# Patient Record
Sex: Female | Born: 1950
Health system: Southern US, Community
[De-identification: ages and names within clinical notes are randomized; demographics above are authoritative.]

## PROBLEM LIST (undated history)

## (undated) DIAGNOSIS — G709 Myoneural disorder, unspecified: Secondary | ICD-10-CM

## (undated) DIAGNOSIS — N951 Menopausal and female climacteric states: Secondary | ICD-10-CM

## (undated) DIAGNOSIS — Z923 Personal history of irradiation: Secondary | ICD-10-CM

## (undated) DIAGNOSIS — L309 Dermatitis, unspecified: Secondary | ICD-10-CM

## (undated) DIAGNOSIS — F419 Anxiety disorder, unspecified: Secondary | ICD-10-CM

## (undated) DIAGNOSIS — M79671 Pain in right foot: Secondary | ICD-10-CM

## (undated) DIAGNOSIS — R351 Nocturia: Secondary | ICD-10-CM

## (undated) DIAGNOSIS — C50412 Malignant neoplasm of upper-outer quadrant of left female breast: Secondary | ICD-10-CM

## (undated) DIAGNOSIS — Z79890 Hormone replacement therapy (postmenopausal): Secondary | ICD-10-CM

## (undated) DIAGNOSIS — F32A Depression, unspecified: Secondary | ICD-10-CM

## (undated) DIAGNOSIS — Z170 Estrogen receptor positive status [ER+]: Secondary | ICD-10-CM

## (undated) DIAGNOSIS — C50919 Malignant neoplasm of unspecified site of unspecified female breast: Secondary | ICD-10-CM

## (undated) DIAGNOSIS — IMO0002 Reserved for concepts with insufficient information to code with codable children: Secondary | ICD-10-CM

## (undated) DIAGNOSIS — Z0000 Encounter for general adult medical examination without abnormal findings: Secondary | ICD-10-CM

## (undated) DIAGNOSIS — F329 Major depressive disorder, single episode, unspecified: Secondary | ICD-10-CM

## (undated) HISTORY — DX: Menopausal and female climacteric states: N95.1

## (undated) HISTORY — DX: Myoneural disorder, unspecified: G70.9

## (undated) HISTORY — DX: Encounter for general adult medical examination without abnormal findings: Z00.00

## (undated) HISTORY — DX: Pain in right foot: M79.671

## (undated) HISTORY — DX: Depression, unspecified: F32.A

## (undated) HISTORY — PX: TONSILLECTOMY: SUR1361

## (undated) HISTORY — DX: Dermatitis, unspecified: L30.9

## (undated) HISTORY — DX: Hormone replacement therapy: Z79.890

## (undated) HISTORY — DX: Major depressive disorder, single episode, unspecified: F32.9

## (undated) HISTORY — DX: Nocturia: R35.1

## (undated) HISTORY — DX: Reserved for concepts with insufficient information to code with codable children: IMO0002

## (undated) HISTORY — DX: Estrogen receptor positive status (ER+): Z17.0

## (undated) HISTORY — DX: Malignant neoplasm of upper-outer quadrant of left female breast: C50.412

## (undated) HISTORY — PX: BREAST LUMPECTOMY: SHX2

## (undated) HISTORY — PX: EYE SURGERY: SHX253

---

## 1999-01-21 ENCOUNTER — Other Ambulatory Visit: Admission: RE | Admit: 1999-01-21 | Discharge: 1999-01-21 | Payer: Self-pay | Admitting: Family Medicine

## 2000-09-19 ENCOUNTER — Other Ambulatory Visit: Admission: RE | Admit: 2000-09-19 | Discharge: 2000-09-19 | Payer: Self-pay | Admitting: Family Medicine

## 2001-10-11 ENCOUNTER — Encounter (INDEPENDENT_AMBULATORY_CARE_PROVIDER_SITE_OTHER): Payer: Self-pay | Admitting: Internal Medicine

## 2001-10-11 LAB — CONVERTED CEMR LAB: Pap Smear: NORMAL

## 2001-10-19 ENCOUNTER — Other Ambulatory Visit: Admission: RE | Admit: 2001-10-19 | Discharge: 2001-10-19 | Payer: Self-pay | Admitting: Family Medicine

## 2003-11-12 ENCOUNTER — Encounter (INDEPENDENT_AMBULATORY_CARE_PROVIDER_SITE_OTHER): Payer: Self-pay | Admitting: Internal Medicine

## 2003-11-12 LAB — CONVERTED CEMR LAB: Pap Smear: NORMAL

## 2003-11-22 ENCOUNTER — Other Ambulatory Visit: Admission: RE | Admit: 2003-11-22 | Discharge: 2003-11-22 | Payer: Self-pay | Admitting: Internal Medicine

## 2003-12-27 ENCOUNTER — Encounter: Admission: RE | Admit: 2003-12-27 | Discharge: 2003-12-27 | Payer: Self-pay | Admitting: Family Medicine

## 2004-01-08 ENCOUNTER — Emergency Department (HOSPITAL_COMMUNITY): Admission: EM | Admit: 2004-01-08 | Discharge: 2004-01-08 | Payer: Self-pay | Admitting: Emergency Medicine

## 2004-05-09 ENCOUNTER — Emergency Department (HOSPITAL_COMMUNITY): Admission: EM | Admit: 2004-05-09 | Discharge: 2004-05-09 | Payer: Self-pay | Admitting: Emergency Medicine

## 2004-05-11 ENCOUNTER — Emergency Department (HOSPITAL_COMMUNITY): Admission: EM | Admit: 2004-05-11 | Discharge: 2004-05-11 | Payer: Self-pay | Admitting: Emergency Medicine

## 2004-12-04 ENCOUNTER — Ambulatory Visit: Payer: Self-pay | Admitting: Family Medicine

## 2004-12-04 ENCOUNTER — Other Ambulatory Visit: Admission: RE | Admit: 2004-12-04 | Discharge: 2004-12-16 | Payer: Self-pay | Admitting: Internal Medicine

## 2004-12-09 ENCOUNTER — Encounter (INDEPENDENT_AMBULATORY_CARE_PROVIDER_SITE_OTHER): Payer: Self-pay | Admitting: Internal Medicine

## 2005-12-27 ENCOUNTER — Ambulatory Visit: Payer: Self-pay | Admitting: Family Medicine

## 2006-01-09 ENCOUNTER — Encounter (INDEPENDENT_AMBULATORY_CARE_PROVIDER_SITE_OTHER): Payer: Self-pay | Admitting: Internal Medicine

## 2006-01-09 LAB — CONVERTED CEMR LAB: Pap Smear: NORMAL

## 2006-01-17 ENCOUNTER — Other Ambulatory Visit: Admission: RE | Admit: 2006-01-17 | Discharge: 2006-01-17 | Payer: Self-pay | Admitting: Family Medicine

## 2006-01-17 ENCOUNTER — Ambulatory Visit: Payer: Self-pay | Admitting: Family Medicine

## 2007-01-31 ENCOUNTER — Encounter (INDEPENDENT_AMBULATORY_CARE_PROVIDER_SITE_OTHER): Payer: Self-pay | Admitting: Internal Medicine

## 2007-01-31 ENCOUNTER — Other Ambulatory Visit: Admission: RE | Admit: 2007-01-31 | Discharge: 2007-01-31 | Payer: Self-pay | Admitting: Family Medicine

## 2007-01-31 ENCOUNTER — Ambulatory Visit: Payer: Self-pay | Admitting: Family Medicine

## 2007-01-31 LAB — CONVERTED CEMR LAB: Pap Smear: NORMAL

## 2007-02-01 LAB — CONVERTED CEMR LAB
ALT: 19 units/L (ref 0–40)
AST: 19 units/L (ref 0–37)
BUN: 20 mg/dL (ref 6–23)
Basophils Absolute: 0 10*3/uL (ref 0.0–0.1)
Basophils Relative: 0.4 % (ref 0.0–1.0)
Bilirubin, Direct: 0.1 mg/dL (ref 0.0–0.3)
CO2: 27 meq/L (ref 19–32)
Calcium: 9.5 mg/dL (ref 8.4–10.5)
Chloride: 107 meq/L (ref 96–112)
Cholesterol: 160 mg/dL (ref 0–200)
Eosinophils Absolute: 0.1 10*3/uL (ref 0.0–0.6)
Eosinophils Relative: 1.7 % (ref 0.0–5.0)
GFR calc Af Amer: 96 mL/min
Glucose, Bld: 88 mg/dL (ref 70–99)
HDL: 59.8 mg/dL (ref >39.0)
Hemoglobin: 13.8 g/dL (ref 12.0–15.0)
LDL Cholesterol: 88 mg/dL (ref 0–99)
MCV: 96.1 fL (ref 78.0–100.0)
Neutro Abs: 3.5 10*3/uL (ref 1.4–7.7)
RDW: 11.8 % (ref 11.5–14.6)
Total CHOL/HDL Ratio: 2.7
Triglycerides: 59 mg/dL (ref 0–149)
WBC: 5.8 10*3/uL (ref 4.5–10.5)

## 2007-03-01 ENCOUNTER — Encounter: Payer: Self-pay | Admitting: Internal Medicine

## 2007-03-02 ENCOUNTER — Ambulatory Visit: Payer: Self-pay | Admitting: Family Medicine

## 2007-03-02 DIAGNOSIS — F329 Major depressive disorder, single episode, unspecified: Secondary | ICD-10-CM

## 2007-03-02 DIAGNOSIS — F325 Major depressive disorder, single episode, in full remission: Secondary | ICD-10-CM | POA: Insufficient documentation

## 2007-03-02 DIAGNOSIS — F339 Major depressive disorder, recurrent, unspecified: Secondary | ICD-10-CM | POA: Insufficient documentation

## 2007-08-14 ENCOUNTER — Telehealth (INDEPENDENT_AMBULATORY_CARE_PROVIDER_SITE_OTHER): Payer: Self-pay | Admitting: Internal Medicine

## 2008-04-04 ENCOUNTER — Telehealth (INDEPENDENT_AMBULATORY_CARE_PROVIDER_SITE_OTHER): Payer: Self-pay | Admitting: Internal Medicine

## 2008-05-06 ENCOUNTER — Encounter (INDEPENDENT_AMBULATORY_CARE_PROVIDER_SITE_OTHER): Payer: Self-pay | Admitting: Internal Medicine

## 2008-05-06 ENCOUNTER — Ambulatory Visit: Payer: Self-pay | Admitting: Family Medicine

## 2008-05-06 ENCOUNTER — Other Ambulatory Visit: Admission: RE | Admit: 2008-05-06 | Discharge: 2008-05-06 | Payer: Self-pay | Admitting: Family Medicine

## 2008-05-06 DIAGNOSIS — R351 Nocturia: Secondary | ICD-10-CM

## 2008-05-06 LAB — CONVERTED CEMR LAB: Pap Smear: NORMAL

## 2008-05-07 LAB — CONVERTED CEMR LAB
Albumin: 4 g/dL (ref 3.5–5.2)
Alkaline Phosphatase: 30 units/L — ABNORMAL LOW (ref 39–117)
Basophils Absolute: 0 10*3/uL (ref 0.0–0.1)
Bilirubin, Direct: 0.1 mg/dL (ref 0.0–0.3)
Eosinophils Absolute: 0.1 10*3/uL (ref 0.0–0.7)
Eosinophils Relative: 2.2 % (ref 0.0–5.0)
GFR calc non Af Amer: 79 mL/min
Glucose, Bld: 88 mg/dL (ref 70–99)
HDL: 58.2 mg/dL (ref >39.0)
Hemoglobin: 13.6 g/dL (ref 12.0–15.0)
Lymphocytes Relative: 31.1 % (ref 12.0–46.0)
MCHC: 33.8 g/dL (ref 30.0–36.0)
Monocytes Absolute: 0.3 10*3/uL (ref 0.1–1.0)
Monocytes Relative: 5.9 % (ref 3.0–12.0)
Neutrophils Relative %: 60.3 % (ref 43.0–77.0)
Platelets: 221 10*3/uL (ref 150–400)
Sodium: 140 meq/L (ref 135–145)
TSH: 1.51 microintl units/mL (ref 0.35–5.50)
Total Bilirubin: 0.9 mg/dL (ref 0.3–1.2)
Total Protein: 6.6 g/dL (ref 6.0–8.3)
Triglycerides: 60 mg/dL (ref 0–149)
WBC: 4.5 10*3/uL (ref 4.5–10.5)

## 2008-05-10 ENCOUNTER — Encounter (INDEPENDENT_AMBULATORY_CARE_PROVIDER_SITE_OTHER): Payer: Self-pay | Admitting: *Deleted

## 2008-05-10 ENCOUNTER — Encounter (INDEPENDENT_AMBULATORY_CARE_PROVIDER_SITE_OTHER): Payer: Self-pay | Admitting: Internal Medicine

## 2008-05-15 ENCOUNTER — Encounter: Admission: RE | Admit: 2008-05-15 | Discharge: 2008-05-15 | Payer: Self-pay | Admitting: Family Medicine

## 2008-05-20 ENCOUNTER — Encounter (INDEPENDENT_AMBULATORY_CARE_PROVIDER_SITE_OTHER): Payer: Self-pay | Admitting: *Deleted

## 2008-05-20 ENCOUNTER — Encounter (INDEPENDENT_AMBULATORY_CARE_PROVIDER_SITE_OTHER): Payer: Self-pay | Admitting: Internal Medicine

## 2008-05-22 ENCOUNTER — Telehealth (INDEPENDENT_AMBULATORY_CARE_PROVIDER_SITE_OTHER): Payer: Self-pay | Admitting: Internal Medicine

## 2009-05-19 ENCOUNTER — Telehealth: Payer: Self-pay | Admitting: Family Medicine

## 2009-06-17 ENCOUNTER — Encounter (INDEPENDENT_AMBULATORY_CARE_PROVIDER_SITE_OTHER): Payer: Self-pay | Admitting: Internal Medicine

## 2009-06-17 ENCOUNTER — Ambulatory Visit: Payer: Self-pay | Admitting: Family Medicine

## 2009-06-17 ENCOUNTER — Telehealth (INDEPENDENT_AMBULATORY_CARE_PROVIDER_SITE_OTHER): Payer: Self-pay | Admitting: Internal Medicine

## 2009-06-17 ENCOUNTER — Other Ambulatory Visit: Admission: RE | Admit: 2009-06-17 | Discharge: 2009-06-17 | Payer: Self-pay | Admitting: Family Medicine

## 2009-06-17 DIAGNOSIS — IMO0002 Reserved for concepts with insufficient information to code with codable children: Secondary | ICD-10-CM

## 2009-06-18 LAB — CONVERTED CEMR LAB
ALT: 14 units/L (ref 0–35)
AST: 18 units/L (ref 0–37)
Albumin: 4.4 g/dL (ref 3.5–5.2)
BUN: 14 mg/dL (ref 6–23)
CO2: 27 meq/L (ref 19–32)
Cholesterol: 173 mg/dL (ref 0–200)
HDL: 78.2 mg/dL (ref >39.00)
LDL Cholesterol: 82 mg/dL (ref 0–99)
TSH: 1.06 microintl units/mL (ref 0.35–5.50)
Total Protein: 7.2 g/dL (ref 6.0–8.3)
VLDL: 12.4 mg/dL (ref 0.0–40.0)

## 2009-06-19 ENCOUNTER — Encounter: Payer: Self-pay | Admitting: Family Medicine

## 2009-06-19 ENCOUNTER — Encounter: Admission: RE | Admit: 2009-06-19 | Discharge: 2009-06-19 | Payer: Self-pay | Admitting: Family Medicine

## 2009-06-23 ENCOUNTER — Encounter: Payer: Self-pay | Admitting: Family Medicine

## 2009-06-23 ENCOUNTER — Encounter (INDEPENDENT_AMBULATORY_CARE_PROVIDER_SITE_OTHER): Payer: Self-pay | Admitting: Internal Medicine

## 2009-06-25 ENCOUNTER — Encounter: Admission: RE | Admit: 2009-06-25 | Discharge: 2009-06-25 | Payer: Self-pay | Admitting: Family Medicine

## 2009-06-26 ENCOUNTER — Encounter (INDEPENDENT_AMBULATORY_CARE_PROVIDER_SITE_OTHER): Payer: Self-pay | Admitting: Internal Medicine

## 2009-06-26 ENCOUNTER — Encounter: Payer: Self-pay | Admitting: Family Medicine

## 2009-07-02 ENCOUNTER — Telehealth (INDEPENDENT_AMBULATORY_CARE_PROVIDER_SITE_OTHER): Payer: Self-pay | Admitting: Internal Medicine

## 2009-08-25 DIAGNOSIS — M79609 Pain in unspecified limb: Secondary | ICD-10-CM

## 2009-08-26 ENCOUNTER — Encounter (INDEPENDENT_AMBULATORY_CARE_PROVIDER_SITE_OTHER): Payer: Self-pay | Admitting: Internal Medicine

## 2009-08-26 ENCOUNTER — Ambulatory Visit: Payer: Self-pay | Admitting: Family Medicine

## 2009-12-26 ENCOUNTER — Ambulatory Visit: Payer: Self-pay | Admitting: Family Medicine

## 2009-12-26 DIAGNOSIS — N951 Menopausal and female climacteric states: Secondary | ICD-10-CM | POA: Insufficient documentation

## 2010-02-23 ENCOUNTER — Telehealth: Payer: Self-pay | Admitting: Family Medicine

## 2010-02-25 ENCOUNTER — Telehealth: Payer: Self-pay | Admitting: Family Medicine

## 2010-07-07 ENCOUNTER — Telehealth: Payer: Self-pay | Admitting: Family Medicine

## 2010-07-22 ENCOUNTER — Ambulatory Visit: Payer: Self-pay | Admitting: Family Medicine

## 2010-07-22 DIAGNOSIS — L259 Unspecified contact dermatitis, unspecified cause: Secondary | ICD-10-CM | POA: Insufficient documentation

## 2010-09-09 ENCOUNTER — Telehealth: Payer: Self-pay | Admitting: Family Medicine

## 2010-11-10 NOTE — Assessment & Plan Note (Signed)
Summary: rash/alc   Vital Signs:  Patient profile:   60 year old female Height:      62.5 inches Weight:      119 pounds BMI:     21.50 Temp:     98.4 degrees F oral Pulse rate:   72 / minute Pulse rhythm:   regular BP sitting:   110 / 82  (left arm) Cuff size:   regular  Vitals Entered By: Linde Gillis CMA Duncan Dull) (July 22, 2010 8:09 AM) CC: rash   History of Present Illness: 60 yo with rash x 3 weeks. Started on chest, very itchy raised bumped.  Occurred a week or so after a sunburn. Rash has not spread elsewhere.  Has improved a little bit since she started using OTC hydrocortisone cream. No changes in detergents or lotions other than OTC lidocaine/aloe she was using for her burn.  Did not take any antihistamines. Never had anything like this before.  Current Medications (verified): 1)  Lexapro 20 Mg  Tabs (Escitalopram Oxalate) .... Take 1 and 1/2 Tabs By Mouth Daily 2)  Premarin 0.3 Mg Tabs (Estrogens Conjugated) .... One By Mouth Daily 3)  Fluocinolone Acetonide 0.01 % Crea (Fluocinolone Acetonide) .... Please Apply To Area Two Times A Day As Needed.  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 Resp:  Denies shortness of breath and wheezing. Derm:  Complains of itching and  rash.  Physical Exam  General:  alert, well-developed, well-nourished, and well-hydrated.   Skin:  scattered raised lesions consistent with urticaria on her anterior chest wall.  Skin otherwise normal Psych:  normally interactive and slightly anxious.     Impression & Recommendations:  Problem # 1:  DERMATITIS, ALLERGIC (ICD-692.9) Assessment New ? due to OTC burn relief preparation. Advised to try Benadryl OTC as well as Lidex as needed. Pt to follow up in 1-2 weeks. Her updated medication list for this problem includes:    Fluocinolone Acetonide 0.01 % Crea (Fluocinolone acetonide) .Marland Kitchen... Please apply to area two times a day as needed.  Complete Medication List: 1)  Lexapro 20 Mg Tabs (Escitalopram oxalate) .... Take 1 and 1/2 tabs by mouth daily 2)  Premarin 0.3 Mg Tabs (Estrogens conjugated) .... One by mouth daily 3)  Fluocinolone Acetonide 0.01 % Crea (Fluocinolone acetonide) .... Please apply to area two times a day as needed.  Patient Instructions: 1)  Great to see you. 2)  Please try Benadryl 25 mg at night to see if this helps, also use the lidex cream twice daily as needed. 3)  Call me a in couple of weeks to let me know how you are doing. Prescriptions: FLUOCINOLONE ACETONIDE 0.01 % CREA (FLUOCINOLONE ACETONIDE) please apply to area two times a day as needed.  #30 g x 0   Entered and Authorized by:   Ruthe Mannan MD   Signed by:   Ruthe Mannan MD on 07/22/2010  Method used:   [facility] [phone][facility]       [address]       [place], [place]  [address]       Ph: [phone] or [phone]       Fax: [phone]   RxID:   [id number]   Current Allergies (reviewed today): No known allergies

## 2010-11-10 NOTE — Progress Notes (Signed)
Summary: Rx Premarin Vaginal Cream  Phone Note Refill Request Call back at [phone] Message from:  [facility] on Feb 23, 2010 10:33 AM  Refills Requested: Medication #1:  PREMARIN 0.625 MG/GM CREA 0.625 g per vaginal daily x 3 weeks   Last Refilled: 12/26/2009 Received e-script request please advise.   Method Requested: Electronic Initial call taken by: Linde Gillis CMA Duncan Dull),  Feb 23, 2010 10:33 AM    Prescriptions: PREMARIN 0.625 MG/GM CREA (ESTROGENS, CONJUGATED) 0.625 g per vaginal daily x 3 weeks, then one week off.  Repeat cycle.  #1 x 1   Entered and Authorized by:   Ruthe Mannan MD   Signed by:   Ruthe Mannan MD on 02/23/2010   Method used:   [facility]ronically to        [facility]  [address] [phone]* (retail)       [address]       [place], [place]  [address]       Ph: [phone] or [phone]       Fax: [phone]   RxID:   [id number]

## 2010-11-10 NOTE — Progress Notes (Signed)
Summary: Rx Lexapro  Phone Note Refill Request Call back at [phone] Message from:  [facility] on July 07, 2010 8:39 AM  Refills Requested: Medication #1:  LEXAPRO 20 MG  TABS Take 1 and 1/2 tabs by mouth daily   Last Refilled: 07/02/2009 Received faxed refill request please advise.     Method Requested: Telephone to Pharmacy Initial call taken by: Linde Gillis CMA Duncan Dull),  July 07, 2010 8:39 AM    Prescriptions: LEXAPRO 20 MG  TABS (ESCITALOPRAM OXALATE) Take 1 and 1/2 tabs by mouth daily  #45 x 12   Entered and Authorized by:   Ruthe Mannan MD   Signed by:   Ruthe Mannan MD on 07/07/2010   Method used:   Electronically to        [facility] [address] [phone]* (retail)       [address]       [place], [place]  [address]       Ph: [phone] or [phone]       Fax: [phone]   RxID:   [id number]

## 2010-11-10 NOTE — Progress Notes (Signed)
Summary: wants to change  to oral premarin  Phone Note Call from Patient Call back at [phone]   Caller: Patient Call For: Dr. Dayton Martes Summary of Call: Pt is currently using premarin cream but she wants to change to the oral medication.  [facility] [address]  The says the cream is inconvenient. Initial call taken by: Lowella Petties CMA,  Feb 25, 2010 4:12 PM    New/Updated Medications: PREMARIN 0.3 MG TABS (ESTROGENS CONJUGATED) one by mouth daily Prescriptions: PREMARIN 0.3 MG TABS (ESTROGENS CONJUGATED) one by mouth daily  #30 x 3   Entered and Authorized by:   Ruthe Mannan MD   Signed by:   Ruthe Mannan MD on 02/26/2010   Method used:   Electronically to        [facility] [address] [phone]* (retail)       [address]       [place], [place]  [address]       Ph: [phone] or [phone]       Fax: [phone]   RxID:   [id number]   Appended Document: wants to change  to oral premarin Left message on voicemail that [facility]

## 2010-11-10 NOTE — Progress Notes (Signed)
Summary: refill request for fluocinolone  Phone Note Refill Request Call back at Work Phone [phone] Call back at [phone] Message from:  Patient  Refills Requested: Medication #1:  FLUOCINOLONE ACETONIDE 0.01 % CREA please apply to area two times a day as needed.. Pt says the rash on her neck is starting to come back.  She would like a refill sent to [address].  Initial call taken by: Lowella Petties CMA, AAMA,  September 09, 2010 3:38 PM  Follow-up for Phone Call        Pls have her return for eval if sxs don't improve in the next 7-10 days. Follow-up by: Shaune Leeks MD,  September 09, 2010 4:12 PM  Additional Follow-up for Phone Call Additional follow up Details #1::        [facility] advising pt. Additional Follow-up by: Lowella Petties CMA, AAMA,  September 09, 2010 4:15 PM    Prescriptions: FLUOCINOLONE ACETONIDE 0.01 % CREA (FLUOCINOLONE ACETONIDE) please apply to area two times a day as needed.  #30 g x 0   Entered and Authorized by:   Shaune Leeks MD   Signed by:   Shaune Leeks MD on 09/09/2010   Method used:   Electronically to        [facility] [address] [phone]* (retail)       [address]       [place], [place]  [id number]       Ph: [phone] or [phone]       Fax: [phone]   RxID:   [id number]

## 2010-11-10 NOTE — Assessment & Plan Note (Signed)
Summary: 30 MIN BILLIE'S PT/CLE   Vital Signs:  Patient profile:   60 year old female Height:      62.5 inches Weight:      115 pounds BMI:     20.77 Temp:     97.9 degrees F oral Pulse rate:   92 / minute Pulse rhythm:   regular BP sitting:   132 / 86  (left arm) Cuff size:   regular  Vitals Entered By: Delilah Shan CMA Duncan Dull) (December 26, 2009 3:16 PM) CC: 30 minute OV  (BDB)   History of Present Illness: 60 yo new to me here to talk about postmenopausal symptoms.  Had been on HRT for years- OCPs and oral premarin. Weaned off both of them in 06/2009 to prevent risk. One week after stopping them, started having hot flashes, insomnia, and vaginal dryness again. No dysparunia. No personal or family h/o breast CA. She is aware of risks but symtpoms are so bad, she willing to try anything.   Current Medications (verified): 1)  Lexapro 20 Mg  Tabs (Escitalopram Oxalate) .... Take 1 and 1/2 Tabs By Mouth Daily 2)  Premarin 0.625 Mg/gm Crea (Estrogens, Conjugated) .... 0.625 G Per Vaginal Daily X 3 Weeks, Then One Week Off.  Repeat Cycle.  Allergies (verified): No Known Drug Allergies  Family History: Father:  Mother:  Siblings:   DM- MI- CVA-  Prostate Cancer- Ovarian Cancer- Uterine Cancer- Colon Cancer- Drug/ ETOH Abuse- Depression-   Review of Systems      See HPI GU:  Complains of nocturia and urinary frequency; denies abnormal vaginal bleeding.  Physical Exam  General:  alert, well-developed, well-nourished, and well-hydrated.  NAD sitting in exam room, walked into office, w/c to exam room Psych:  normally interactive and slightly anxious.     Impression & Recommendations:  Problem # 1:  MENOPAUSE-RELATED VASOMOTOR SYMPTOMS, HOT FLASHES (ICD-627.2) Assessment Deteriorated Time spent with patient 25 minutes, more than 50% of this time was spent counseling patient on treatment options. We could try clonidine for hot flashes. Given vaginal dryness, I did  give her uptodate handouts discussing risks and benefits of estrogen ONLY therapy. She is willing to try topical estrogen for 2 months to see if helps with vaginal dryness, aware of increased risk of breast CA and heart disease although she knows it is lower than with combined therapy.  Follow up with me in 1 month.  Her updated medication list for this problem includes:    Premarin 0.625 Mg/gm Crea (Estrogens, conjugated) .Marland Kitchen... 0.625 g per vaginal daily x 3 weeks, then one week off.  repeat cycle.  Complete Medication List: 1)  Lexapro 20 Mg Tabs (Escitalopram oxalate) .... Take 1 and 1/2 tabs by mouth daily 2)  Premarin 0.625 Mg/gm Crea (Estrogens, conjugated) .... 0.625 g per vaginal daily x 3 weeks, then one week off.  repeat cycle. Prescriptions: PREMARIN 0.625 MG/GM CREA (ESTROGENS, CONJUGATED) 0.625 g per vaginal daily x 3 weeks, then one week off.  Repeat cycle.  #1 x 1   Entered and Authorized by:   Ruthe Mannan MD   Signed by:   Ruthe Mannan MD on 12/26/2009   Method used:   [facility]ronically to        [facility]  [address] [phone]* (retail)       [address]       [place], [place]  [id number]       Ph: [phone]  or [phone]       Fax: [phone]   RxID:   [id number]   Current Allergies (reviewed today): No known allergies

## 2011-05-17 ENCOUNTER — Other Ambulatory Visit: Payer: Self-pay | Admitting: *Deleted

## 2011-05-17 MED ORDER — ESTROGENS CONJUGATED 0.3 MG PO TABS: 0.3000 mg | ORAL_TABLET | Freq: Every day | ORAL | Status: DC

## 2011-05-17 NOTE — Telephone Encounter (Signed)
Last refill 04/12/2011.

## 2011-07-05 ENCOUNTER — Other Ambulatory Visit: Payer: Self-pay | Admitting: *Deleted

## 2011-07-05 MED ORDER — ESTROGENS CONJUGATED 0.3 MG PO TABS: 0.3000 mg | ORAL_TABLET | Freq: Every day | ORAL | Status: DC

## 2011-07-29 ENCOUNTER — Other Ambulatory Visit: Payer: Self-pay | Admitting: *Deleted

## 2011-07-29 MED ORDER — ESCITALOPRAM OXALATE 20 MG PO TABS: 20.0000 mg | ORAL_TABLET | Freq: Every day | ORAL | Status: DC

## 2011-07-29 NOTE — Telephone Encounter (Signed)
THIS MEDICATION IS NOT ON PATIENTS MEDICATION LIST

## 2011-08-12 ENCOUNTER — Telehealth: Payer: Self-pay | Admitting: Family Medicine

## 2011-08-12 MED ORDER — PROGESTERONE MICRONIZED 100 MG PO CAPS: 100.0000 mg | ORAL_CAPSULE | Freq: Every day | ORAL | Status: DC

## 2011-08-12 NOTE — Telephone Encounter (Signed)
Left voicemail for pt to return my call. Has been on unopposed estrogen for several months (still has a uterus). I would like to add Prometrium to her Premarin to decrease risk of endometrial hyperplasia which increases after she has been on it for over a year. I will send rx to her pharmacy.

## 2011-08-13 ENCOUNTER — Telehealth: Payer: Self-pay | Admitting: Family Medicine

## 2011-08-13 MED ORDER — ESTROGENS CONJUGATED 0.3 MG PO TABS: 0.3000 mg | ORAL_TABLET | Freq: Every day | ORAL | Status: DC

## 2011-08-13 MED ORDER — PROGESTERONE MICRONIZED 100 MG PO CAPS: 100.0000 mg | ORAL_CAPSULE | Freq: Every day | ORAL | Status: DC

## 2011-08-13 NOTE — Telephone Encounter (Signed)
Spoke with pt. Doing very well with estrogen. Sleeping better. No PMB. Will add progesterone since she still has a uterus. The patient indicates understanding of these issues and agrees with the plan.

## 2011-10-21 ENCOUNTER — Other Ambulatory Visit: Payer: Self-pay | Admitting: *Deleted

## 2011-10-21 MED ORDER — ESCITALOPRAM OXALATE 20 MG PO TABS: 20.0000 mg | ORAL_TABLET | Freq: Every day | ORAL | Status: DC

## 2011-10-21 NOTE — Telephone Encounter (Signed)
Patient not seen in over 1 year okay to refill? 

## 2011-10-22 ENCOUNTER — Telehealth: Payer: Self-pay | Admitting: Internal Medicine

## 2011-10-22 NOTE — Telephone Encounter (Signed)
Patient stated she was on 30mg  and wanted to know why it was dropped to 20mg .

## 2011-10-22 NOTE — Telephone Encounter (Signed)
Which medication ?

## 2011-10-25 MED ORDER — ESCITALOPRAM OXALATE 20 MG PO TABS: 30.0000 mg | ORAL_TABLET | Freq: Every day | ORAL | Status: DC

## 2011-10-25 NOTE — Telephone Encounter (Signed)
Left message on cell phone voicemail advising patient that [facility]

## 2011-10-25 NOTE — Telephone Encounter (Signed)
Correct rx sent.

## 2011-10-25 NOTE — Telephone Encounter (Signed)
Sorry it was Lexapro.

## 2011-12-19 ENCOUNTER — Other Ambulatory Visit: Payer: Self-pay | Admitting: Family Medicine

## 2011-12-22 ENCOUNTER — Other Ambulatory Visit: Payer: Self-pay | Admitting: Family Medicine

## 2012-01-17 ENCOUNTER — Telehealth: Payer: Self-pay | Admitting: Family Medicine

## 2012-01-17 NOTE — Telephone Encounter (Signed)
Caller: Kathy Bishop/Patient; PCP: Ruthe Mannan ([facility]); CB#: [phone]; Call regarding intermittent light to moderate painless vaginal bleeding x 3 wks. Prescribed Premarin and Prometrium but ook Prometrium x 12 days but dc'd this on 01/13. See in 2 wks per Vaginal Bleeding: On MHT Protocol. Pt is going to refill her Prometrium tonight. She ? why her rx is for Prometrium 100mg  # 12 take 1 at night, if she is to take this daily. Should it be for # 30 pills?  Pls call [facility] for an appt.

## 2012-01-21 ENCOUNTER — Other Ambulatory Visit: Payer: Self-pay | Admitting: Family Medicine

## 2012-02-14 ENCOUNTER — Other Ambulatory Visit: Payer: Self-pay | Admitting: Family Medicine

## 2012-02-15 ENCOUNTER — Encounter: Payer: Self-pay | Admitting: Family Medicine

## 2012-02-15 ENCOUNTER — Ambulatory Visit (INDEPENDENT_AMBULATORY_CARE_PROVIDER_SITE_OTHER): Payer: BC Managed Care – PPO | Admitting: Family Medicine

## 2012-02-15 ENCOUNTER — Other Ambulatory Visit: Payer: Self-pay | Admitting: Family Medicine

## 2012-02-15 VITALS — BP 120/90 | HR 56 | Temp 97.9°F | Wt 115.0 lb

## 2012-02-15 DIAGNOSIS — R5383 Other fatigue: Secondary | ICD-10-CM

## 2012-02-15 DIAGNOSIS — F3289 Other specified depressive episodes: Secondary | ICD-10-CM

## 2012-02-15 DIAGNOSIS — D72819 Decreased white blood cell count, unspecified: Secondary | ICD-10-CM | POA: Insufficient documentation

## 2012-02-15 DIAGNOSIS — R5381 Other malaise: Secondary | ICD-10-CM

## 2012-02-15 DIAGNOSIS — F329 Major depressive disorder, single episode, unspecified: Secondary | ICD-10-CM

## 2012-02-15 LAB — CBC WITH DIFFERENTIAL/PLATELET
Eosinophils Absolute: 0.2 10*3/uL (ref 0.0–0.7)
Eosinophils Relative: 5.2 % — ABNORMAL HIGH (ref 0.0–5.0)
Lymphocytes Relative: 44.8 % (ref 12.0–46.0)
Lymphs Abs: 1.8 10*3/uL (ref 0.7–4.0)
MCHC: 33.9 g/dL (ref 30.0–36.0)
Neutro Abs: 1.7 10*3/uL (ref 1.4–7.7)
Platelets: 183 10*3/uL (ref 150.0–400.0)

## 2012-02-15 LAB — COMPREHENSIVE METABOLIC PANEL
ALT: 21 U/L (ref 0–35)
AST: 25 U/L (ref 0–37)
Albumin: 4.1 g/dL (ref 3.5–5.2)
Alkaline Phosphatase: 47 U/L (ref 39–117)
Chloride: 106 mEq/L (ref 96–112)
GFR: 74.45 mL/min (ref >60.00)
Potassium: 5.1 mEq/L (ref 3.5–5.1)
Total Bilirubin: 0.5 mg/dL (ref 0.3–1.2)

## 2012-02-15 MED ORDER — PROGESTERONE MICRONIZED 100 MG PO CAPS: 100.0000 mg | ORAL_CAPSULE | Freq: Every day | ORAL | Status: DC

## 2012-02-15 MED ORDER — ESCITALOPRAM OXALATE 20 MG PO TABS: 30.0000 mg | ORAL_TABLET | Freq: Every day | ORAL | Status: DC

## 2012-02-15 MED ORDER — ESTROGENS CONJUGATED 0.3 MG PO TABS: ORAL_TABLET | ORAL | Status: DC

## 2012-02-15 NOTE — Patient Instructions (Signed)
Good to see you. You can try to take two tablets of premarin daily and see if that helps with your night flashes. Call me in a few weeks in with an update.

## 2012-02-15 NOTE — Progress Notes (Signed)
Subjective:    Patient ID: Kathy Bishop, female    DOB: Apr 22, 1951, 61 y.o.   MRN: [id number]  HPI  61 yo here for follow up.  Depression- Lexapro is working well.  Denies any symptoms of depression or anxiety.  Fatigue- past year feels more fatigued than usual.  Has never been a great sleeper but usually felt rested during the day.  Denies any CP or SOB. Denies any other symptoms of hypo or hyper thyroidism.  Premarin appears to be working well but having a few more hot flashes than she did after she started taking it.  Taking Prometrium nightly to prevent endometrial hyperplasia.  Patient Active Problem List  Diagnoses  . DEPRESSION  . MENOPAUSE-RELATED VASOMOTOR SYMPTOMS, HOT FLASHES  . DERMATITIS, ALLERGIC  . BACK PAIN, LUMBAR, WITH RADICULOPATHY  . FOOT PAIN, RIGHT  . NOCTURIA  . Fatigue   Past Medical History  Diagnosis Date  . Thoracic or lumbosacral neuritis or radiculitis, unspecified   . Depression   . Dermatitis     allergic  . Foot pain, right   . Symptomatic menopausal or female climacteric states   . Nocturia   . Need for prophylactic hormone replacement therapy (postmenopausal)   . Routine general medical examination at a health care facility    Past Surgical History  Procedure Date  . Tonsillectomy     age 63   History  Substance Use Topics  . Smoking status: Former Games developer  . Smokeless tobacco: Not on file   Comment: smoked years ago  . Alcohol Use: Not on file   No family history on file. No Known Allergies Current Outpatient Prescriptions on File Prior to Visit  Medication Sig Dispense Refill  . DISCONTD: escitalopram (LEXAPRO) 20 MG tablet Take 1.5 tablets (30 mg total) by mouth daily.  60 tablet  2  . DISCONTD: PREMARIN 0.3 MG tablet TAKE 1 TABLET (0.3 MG TOTAL) BY MOUTH DAILY. TAKE DAILY FOR 21 DAYS THEN DO NOT TAKE FOR 7 DAYS.  30 tablet  0  . DISCONTD: progesterone (PROMETRIUM) 100 MG capsule Take 1 capsule (100 mg total) by mouth at  bedtime.  12 capsule  5   The PMH, PSH, Social History, Family History, Medications, and allergies have been reviewed in [facility], and have been updated if relevant.    Review of Systems See HPI    Objective:   Physical Exam BP 120/90  Pulse 56  Temp(Src) 97.9 F (36.6 C) (Oral)  Wt 115 lb (52.164 kg)  General:  Well-developed,well-nourished,in no acute distress; alert,appropriate and cooperative throughout examination Head:  normocephalic and atraumatic.   Eyes:  vision grossly intact, pupils equal, pupils round, and pupils reactive to light.   Ears:  R ear normal and L ear normal.   Nose:  no external deformity.   Lungs:  Normal respiratory effort, chest expands symmetrically. Lungs are clear to auscultation, no crackles or wheezes. Heart:  Normal rate and regular rhythm. S1 and S2 normal without gallop, murmur, click, rub or other extra sounds. Abdomen:  Bowel sounds positive,abdomen soft and non-tender without masses, organomegaly or hernias noted. Msk:  No deformity or scoliosis noted of thoracic or lumbar spine.   Extremities:  No clubbing, cyanosis, edema, or deformity noted with normal full range of motion of all joints.   Neurologic:  alert & oriented X3 and gait normal.   Skin:  Intact without suspicious lesions or rashes Cervical Nodes:  No lymphadenopathy noted Psych:  Cognition and judgment appear intact.  Alert and cooperative with normal attention span and concentration. No apparent delusions, illusions, hallucinations     Assessment & Plan:   1. Fatigue  New- likely multifactorial. Will check labs to rule out other possible factors. The patient indicates understanding of these issues and agrees with the plan.  CBC with Differential, TSH, T4, Free, Comprehensive metabolic panel  2. DEPRESSION  Stable.  Lexapro refilled.

## 2012-02-25 ENCOUNTER — Other Ambulatory Visit (INDEPENDENT_AMBULATORY_CARE_PROVIDER_SITE_OTHER): Payer: BC Managed Care – PPO

## 2012-02-25 DIAGNOSIS — D72819 Decreased white blood cell count, unspecified: Secondary | ICD-10-CM

## 2012-02-26 LAB — CBC WITH DIFFERENTIAL/PLATELET
Eosinophils Absolute: 0.1 10*3/uL (ref 0.0–0.7)
Eosinophils Relative: 2 % (ref 0–5)
Hemoglobin: 12.9 g/dL (ref 12.0–15.0)
MCV: 97.2 fL (ref 78.0–100.0)
Monocytes Relative: 5 % (ref 3–12)
Neutrophils Relative %: 54 % (ref 43–77)
RDW: 12.8 % (ref 11.5–15.5)

## 2012-07-31 ENCOUNTER — Other Ambulatory Visit: Payer: Self-pay | Admitting: Family Medicine

## 2012-07-31 DIAGNOSIS — Z1231 Encounter for screening mammogram for malignant neoplasm of breast: Secondary | ICD-10-CM

## 2012-08-04 ENCOUNTER — Ambulatory Visit
Admission: RE | Admit: 2012-08-04 | Discharge: 2012-08-04 | Disposition: A | Discharge status: Home / Self Care | Payer: BC Managed Care – PPO | Source: Ambulatory Visit | Attending: Family Medicine | Admitting: Family Medicine

## 2012-08-04 DIAGNOSIS — Z1231 Encounter for screening mammogram for malignant neoplasm of breast: Secondary | ICD-10-CM

## 2012-09-26 ENCOUNTER — Encounter: Payer: Self-pay | Admitting: Family Medicine

## 2012-09-26 ENCOUNTER — Ambulatory Visit (INDEPENDENT_AMBULATORY_CARE_PROVIDER_SITE_OTHER): Payer: BC Managed Care – PPO | Admitting: Family Medicine

## 2012-09-26 VITALS — BP 120/82 | HR 60 | Temp 97.9°F | Wt 117.0 lb

## 2012-09-26 DIAGNOSIS — R51 Headache: Secondary | ICD-10-CM

## 2012-09-26 MED ORDER — FLUTICASONE PROPIONATE 50 MCG/ACT NA SUSP: 2.0000 | Freq: Every day | NASAL | Status: DC

## 2012-09-26 MED ORDER — KETOROLAC TROMETHAMINE 30 MG/ML IJ SOLN
30.0000 mg | Freq: Once | INTRAMUSCULAR | Status: AC
  Administered 2012-09-26: 30 mg via INTRAVENOUS

## 2012-09-26 MED ORDER — SUMATRIPTAN SUCCINATE 25 MG PO TABS: 25.0000 mg | ORAL_TABLET | ORAL | Status: DC | PRN

## 2012-09-26 NOTE — Progress Notes (Signed)
Subjective:    Patient ID: Kathy Bishop, female    DOB: 09/23/1951, 61 y.o.   MRN: [id number]  HPI  Very pleasant 61 yo female here for 10 days of intermittent eye pain and headaches.  Started while she was running 10 days ago- acute onset of 10/10 bilateral eye pain that progressed to pain around her bilateral temples and top of her head.  No blurred vision.  This was associated with nausea and photophobia- no vomiting.  NSAIDs and Tylenol did not relieve pain.  Eventually pain went away but has been intermittent, at times waking her up at night.    Once last week, symptoms started when she bent down to pick something up.  This morning, she has the pain and photophobia around her left eye only.  Denies any other focal neurological symptoms.  She does not have a known h/o migraine headaches.  Patient Active Problem List  Diagnosis  . DEPRESSION  . MENOPAUSE-RELATED VASOMOTOR SYMPTOMS, HOT FLASHES  . DERMATITIS, ALLERGIC  . BACK PAIN, LUMBAR, WITH RADICULOPATHY  . FOOT PAIN, RIGHT  . NOCTURIA  . Fatigue  . Leukopenia   Past Medical History  Diagnosis Date  . Thoracic or lumbosacral neuritis or radiculitis, unspecified   . Depression   . Dermatitis     allergic  . Foot pain, right   . Symptomatic menopausal or female climacteric states   . Nocturia   . Need for prophylactic hormone replacement therapy (postmenopausal)   . Routine general medical examination at a health care facility    Past Surgical History  Procedure Date  . Tonsillectomy     age 65   History  Substance Use Topics  . Smoking status: Former Games developer  . Smokeless tobacco: Never Used     Comment: smoked years ago  . Alcohol Use: Yes   No family history on file. No Known Allergies Current Outpatient Prescriptions on File Prior to Visit  Medication Sig Dispense Refill  . escitalopram (LEXAPRO) 20 MG tablet Take 1.5 tablets (30 mg total) by mouth daily.  60 tablet  11  . estrogens, conjugated,  (PREMARIN) 0.3 MG tablet 1 tablet by mouth daily.  30 tablet  11  . progesterone (PROMETRIUM) 100 MG capsule Take 1 capsule (100 mg total) by mouth at bedtime.  30 capsule  11  . fluticasone (FLONASE) 50 MCG/ACT nasal spray Place 2 sprays into the nose daily.  16 g  6  . SUMAtriptan (IMITREX) 25 MG tablet Take 1 tablet (25 mg total) by mouth every 2 (two) hours as needed for migraine.  10 tablet  0   The PMH, PSH, Social History, Family History, Medications, and allergies have been reviewed in [facility], and have been updated if relevant.   Review of Systems See HPI    Objective:   Physical Exam BP 120/82  Pulse 60  Temp 97.9 F (36.6 C) (Oral)  Wt 117 lb (53.071 kg)  General:  Well-developed,well-nourished,in no acute distress; alert,appropriate and cooperative throughout examination Head:  normocephalic and atraumatic.   Eyes:  vision grossly intact, pupils equal, pupils round, and pupils reactive to light. No photophobia   Ears:  R ear normal and L ear normal.   Nose:  no external deformity.   Mouth:  good dentition.   Psych:  Cognition and judgment appear intact. Alert and cooperative with normal attention span and concentration. No apparent delusions, illusions, hallucinations Neuro:  CN II-XII intact, normal and symmetrical reflexes throughout. Normal gait  Assessment & Plan:   1. Headache around the eyes  MR MRA Head/Brain Wo Cm, ketorolac (TORADOL) 30 MG/ML injection 30 mg   New- ?Migraine variant given nausea and photophobia but it is not classic migraine presentation.  Given toradol IM in office- start imitrex as needed. Discussed keeping a HA journal.  Given other symptoms- such as triggers from bending down, bilateral and severe nature, will order MRI/MRA of head to rule out tumors, AVM, etc. The patient indicates understanding of these issues and agrees with the plan.

## 2012-09-26 NOTE — Patient Instructions (Addendum)
Great to see you. Please stop by to see Kathy Bishop on your way out to set up your MRI.  Imitrex as needed.  Call me later this week with update.

## 2012-09-29 ENCOUNTER — Ambulatory Visit
Admission: RE | Admit: 2012-09-29 | Discharge: 2012-09-29 | Disposition: A | Discharge status: Home / Self Care | Payer: BC Managed Care – PPO | Source: Ambulatory Visit | Attending: Family Medicine | Admitting: Family Medicine

## 2012-10-15 ENCOUNTER — Other Ambulatory Visit: Payer: Self-pay | Admitting: Family Medicine

## 2012-12-11 ENCOUNTER — Other Ambulatory Visit: Payer: Self-pay | Admitting: Family Medicine

## 2013-01-20 ENCOUNTER — Other Ambulatory Visit: Payer: Self-pay | Admitting: Family Medicine

## 2013-02-14 ENCOUNTER — Emergency Department (HOSPITAL_COMMUNITY): Payer: BC Managed Care – PPO

## 2013-02-14 ENCOUNTER — Encounter (HOSPITAL_COMMUNITY): Payer: Self-pay | Admitting: *Deleted

## 2013-02-14 ENCOUNTER — Other Ambulatory Visit: Payer: Self-pay

## 2013-02-14 ENCOUNTER — Emergency Department (HOSPITAL_COMMUNITY)
Admission: EM | Admit: 2013-02-14 | Discharge: 2013-02-14 | Disposition: A | Discharge status: Home / Self Care | Payer: BC Managed Care – PPO | Attending: Emergency Medicine | Admitting: Emergency Medicine

## 2013-02-14 DIAGNOSIS — F329 Major depressive disorder, single episode, unspecified: Secondary | ICD-10-CM | POA: Insufficient documentation

## 2013-02-14 DIAGNOSIS — R110 Nausea: Secondary | ICD-10-CM | POA: Insufficient documentation

## 2013-02-14 DIAGNOSIS — Z8742 Personal history of other diseases of the female genital tract: Secondary | ICD-10-CM | POA: Insufficient documentation

## 2013-02-14 DIAGNOSIS — Z8739 Personal history of other diseases of the musculoskeletal system and connective tissue: Secondary | ICD-10-CM | POA: Insufficient documentation

## 2013-02-14 DIAGNOSIS — IMO0002 Reserved for concepts with insufficient information to code with codable children: Secondary | ICD-10-CM | POA: Insufficient documentation

## 2013-02-14 DIAGNOSIS — H81399 Other peripheral vertigo, unspecified ear: Secondary | ICD-10-CM

## 2013-02-14 DIAGNOSIS — H5310 Unspecified subjective visual disturbances: Secondary | ICD-10-CM | POA: Insufficient documentation

## 2013-02-14 DIAGNOSIS — Z79899 Other long term (current) drug therapy: Secondary | ICD-10-CM | POA: Insufficient documentation

## 2013-02-14 DIAGNOSIS — Z872 Personal history of diseases of the skin and subcutaneous tissue: Secondary | ICD-10-CM | POA: Insufficient documentation

## 2013-02-14 DIAGNOSIS — Z87891 Personal history of nicotine dependence: Secondary | ICD-10-CM | POA: Insufficient documentation

## 2013-02-14 DIAGNOSIS — F3289 Other specified depressive episodes: Secondary | ICD-10-CM | POA: Insufficient documentation

## 2013-02-14 LAB — CBC WITH DIFFERENTIAL/PLATELET
Basophils Absolute: 0 10*3/uL (ref 0.0–0.1)
Basophils Relative: 0 % (ref 0–1)
Eosinophils Absolute: 0 10*3/uL (ref 0.0–0.7)
Eosinophils Relative: 0 % (ref 0–5)
HCT: 40.1 % (ref 36.0–46.0)
MCH: 31.5 pg (ref 26.0–34.0)
MCHC: 34.2 g/dL (ref 30.0–36.0)
MCV: 92.2 fL (ref 78.0–100.0)
Monocytes Absolute: 0.3 10*3/uL (ref 0.1–1.0)
Platelets: 170 10*3/uL (ref 150–400)
RDW: 12.1 % (ref 11.5–15.5)
WBC: 6.6 10*3/uL (ref 4.0–10.5)

## 2013-02-14 LAB — BASIC METABOLIC PANEL
CO2: 21 mEq/L (ref 19–32)
Calcium: 9.5 mg/dL (ref 8.4–10.5)
Creatinine, Ser: 0.64 mg/dL (ref 0.50–1.10)
GFR calc non Af Amer: >90 mL/min (ref >90)
Sodium: 138 mEq/L (ref 135–145)

## 2013-02-14 LAB — URINALYSIS, ROUTINE W REFLEX MICROSCOPIC
Glucose, UA: NEGATIVE mg/dL
Ketones, ur: 15 mg/dL — AB
Leukocytes, UA: NEGATIVE
Protein, ur: NEGATIVE mg/dL
Urobilinogen, UA: 0.2 mg/dL (ref 0.0–1.0)

## 2013-02-14 LAB — GLUCOSE, CAPILLARY: Glucose-Capillary: 119 mg/dL — ABNORMAL HIGH (ref 70–99)

## 2013-02-14 MED ORDER — SODIUM CHLORIDE 0.9 % IV BOLUS (SEPSIS)
1000.0000 mL | Freq: Once | INTRAVENOUS | Status: AC
  Administered 2013-02-14: 1000 mL via INTRAVENOUS

## 2013-02-14 MED ORDER — ONDANSETRON HCL 4 MG/2ML IJ SOLN
4.0000 mg | Freq: Once | INTRAMUSCULAR | Status: AC
  Administered 2013-02-14: 4 mg via INTRAVENOUS
  Filled 2013-02-14: qty 2

## 2013-02-14 MED ORDER — ONDANSETRON 4 MG PO TBDP: 4.0000 mg | ORAL_TABLET | Freq: Three times a day (TID) | ORAL | Status: DC | PRN

## 2013-02-14 MED ORDER — MECLIZINE HCL 25 MG PO TABS
25.0000 mg | ORAL_TABLET | Freq: Once | ORAL | Status: AC
  Administered 2013-02-14: 25 mg via ORAL
  Filled 2013-02-14: qty 1

## 2013-02-14 MED ORDER — DIPHENHYDRAMINE HCL 50 MG/ML IJ SOLN
25.0000 mg | Freq: Once | INTRAMUSCULAR | Status: AC
Start: 2013-02-14 — End: 2013-02-14
  Administered 2013-02-14: 25 mg via INTRAVENOUS
  Filled 2013-02-14: qty 1

## 2013-02-14 MED ORDER — PROMETHAZINE HCL 25 MG RE SUPP: 25.0000 mg | Freq: Four times a day (QID) | RECTAL | Status: DC | PRN

## 2013-02-14 MED ORDER — MECLIZINE HCL 50 MG PO TABS: 50.0000 mg | ORAL_TABLET | Freq: Three times a day (TID) | ORAL | Status: DC | PRN

## 2013-02-14 MED ORDER — MECLIZINE HCL 25 MG PO TABS: 25.0000 mg | ORAL_TABLET | Freq: Three times a day (TID) | ORAL | Status: DC | PRN

## 2013-02-14 NOTE — ED Notes (Addendum)
Per EMS report: pt from home: pt of c/o of feeling dizzy where the room is spinning especially when pt sits up. Pt reports symptoms began around 00:30.  Pt became nausea when she sat up. EMS started an IV and gave pt 4mg  Zofran which helped pt's nausea and vomiting.  However pt began vomiting again enroute.  EMS observed pt vomiting x 5-6 after Zofran.  EMS reports pt is a/o x 4 and skin is clammy.  Pt on 2L [place].  EMS VS: BP:118/88, HR: 72, RR: 20

## 2013-02-14 NOTE — ED Notes (Signed)
At bedside

## 2013-02-14 NOTE — ED Notes (Signed)
Pt, no dysphasia prior to/after symptom of dizziness

## 2013-02-14 NOTE — ED Notes (Signed)
[id number]<AV> Expected date:<BR> Expected time:<BR> Means of arrival:<BR> Comments:<BR> EMS/62 yo female with vertigo symptoms/nausea/dizziness/IV Zofran per EMS

## 2013-02-14 NOTE — ED Provider Notes (Signed)
History     CSN: [id number]  Arrival date & time 02/14/13  0603   None     No chief complaint on file.   (Consider location/radiation/quality/duration/timing/severity/associated sxs/prior treatment) HPI Comments: Pt with PMH significant for depression presents to the ED for dizziness and nausea since approx 2:30am.  States she got up to go to the bathroom and the room started spinning.  She immediately sat back down because she felt as though she were about to pass out.  A few minutes later she was able to get up, went to the bathroom and ate a few crackers to help with the nausea.  Pt notes she still felt unsteady on her feet but was able to walk.  Notes dizziness occurs only when she attempts to get up or moves her head.  Denies any chest pain, SOB, palpitations, abdominal pain, vomiting, diarrhea, urinary frequency, dysuria, or hematuria.  Recent MRI 09/2012 for evaluation of migraines which was negative.  No personal or family hx of MI, Stroke, or cardiac complications.  The history is provided by the patient.    Past Medical History  Diagnosis Date  . Thoracic or lumbosacral neuritis or radiculitis, unspecified   . Depression   . Dermatitis     allergic  . Foot pain, right   . Symptomatic menopausal or female climacteric states   . Nocturia   . Need for prophylactic hormone replacement therapy (postmenopausal)   . Routine general medical examination at a health care facility     Past Surgical History  Procedure Laterality Date  . Tonsillectomy      age 36    History reviewed. No pertinent family history.  History  Substance Use Topics  . Smoking status: Former Games developer  . Smokeless tobacco: Never Used     Comment: smoked years ago  . Alcohol Use: 4.2 oz/week    7 Glasses of wine per week    OB History   Grav Para Term Preterm Abortions TAB SAB Ect Mult Living                  Review of Systems  Gastrointestinal: Positive for nausea.  Neurological: Positive for  dizziness.  All other systems reviewed and are negative.    Allergies  Review of patient's allergies indicates no known allergies.  Home Medications   Current Outpatient Rx  Name  Route  Sig  Dispense  Refill  . escitalopram (LEXAPRO) 20 MG tablet   Oral   Take 1.5 tablets (30 mg total) by mouth daily.   60 tablet   11   . estrogens, conjugated, (PREMARIN) 0.3 MG tablet      1 tablet by mouth daily.   30 tablet   11     Pt must be seen before further refills.   . fluticasone (FLONASE) 50 MCG/ACT nasal spray   Nasal   Place 2 sprays into the nose daily.   16 g   6   . progesterone (PROMETRIUM) 100 MG capsule   Oral   Take 1 capsule (100 mg total) by mouth at bedtime.   30 capsule   11   . SUMAtriptan (IMITREX) 25 MG tablet      TAKE 1 TABLET (25 MG TOTAL) BY MOUTH EVERY 2 (TWO) HOURS AS NEEDED FOR MIGRAINE.   10 tablet   0     BP 144/87  Temp(Src) 97.9 F (36.6 C) (Oral)  Resp 15  Ht 5\' 3"  (1.6 m)  Wt 117 lb (  53.071 kg)  BMI 20.73 kg/m2  SpO2 100%  Physical Exam  Nursing note and vitals reviewed. Constitutional: She is oriented to person, place, and time. She appears well-developed and well-nourished. No distress.  HENT:  Head: Normocephalic and atraumatic.  Mouth/Throat: Oropharynx is clear and moist.  Eyes: Conjunctivae and EOM are normal. Pupils are equal, round, and reactive to light.  Neck: Normal range of motion.  Cardiovascular: Normal rate, regular rhythm and normal heart sounds.   Pulmonary/Chest: Effort normal and breath sounds normal. No respiratory distress.  Abdominal: Soft. Bowel sounds are normal. There is no tenderness. There is no guarding and no CVA tenderness.  Musculoskeletal: Normal range of motion.  Neurological: She is alert and oriented to person, place, and time. She has normal strength. She displays no tremor. No cranial nerve deficit or sensory deficit. She displays no seizure activity.  Equal strength UE and LE  bilaterally, no acute neuro deficits or facial droop appreciated  Skin: Skin is warm and dry. She is not diaphoretic.  Psychiatric: She has a normal mood and affect. Her speech is normal.    ED Course  Procedures (including critical care time)   Date: 02/14/2013  Rate: 66  Rhythm: normal sinus rhythm  QRS Axis: normal  Intervals: PR prolonged  ST/T Wave abnormalities: normal  Conduction Disutrbances:first-degree A-V block   Narrative Interpretation: 1st degree AV block, no STEMI  Old EKG Reviewed: none available    Labs Reviewed  CBC WITH DIFFERENTIAL - Abnormal; Notable for the following:    Neutrophils Relative 84 (*)    All other components within normal limits  BASIC METABOLIC PANEL - Abnormal; Notable for the following:    Glucose, Bld 120 (*)    All other components within normal limits  URINALYSIS, ROUTINE W REFLEX MICROSCOPIC - Abnormal; Notable for the following:    APPearance CLOUDY (*)    Ketones, ur 15 (*)    All other components within normal limits  GLUCOSE, CAPILLARY - Abnormal; Notable for the following:    Glucose-Capillary 119 (*)    All other components within normal limits  POCT I-STAT TROPONIN I   Dg Chest 2 View  02/14/2013  *RADIOLOGY REPORT*  Clinical Data: Nausea and vomiting, dizziness  CHEST - 2 VIEW  Comparison: None.  Findings: Normal cardiac silhouette and mediastinal contours.  No focal parenchymal opacities.  No pleural effusion or pneumothorax. No acute osseous abnormality.  IMPRESSION: No acute cardiopulmonary disease.   Original Report Authenticated By: Tacey Ruiz, MD      1. Peripheral vertigo, unspecified   2. Nausea       MDM   Patient was sent in to the ED for sudden onset of dizziness and nausea since this a.m. No history of symptoms like this in the past.  Patient in no acute distress, no acute neuro deficits appreciated on exam, vital signs stable.  Labs largely WNL. EKG with first degree AV block, no acute ischemic  changes. Troponin negative.  Patient given IVF, Zofran, and meclizine in the ED with minimal improvement of symptoms. Given nature of symptoms, this is likely peripheral vertigo. Patient evaluated by Dr. Rhunette Croft who agrees with assessment. Patient will be discharged and followup with ENT, Dr. Annalee Genta. Rx meclizine, Zofran, and Phenergan suppositories. Discussed plan with patient, she agreed. Return precautions advised.       Garlon Hatchet, PA-C 02/15/13 [phone]  Shared service with midlevel provider. I have personally seen and examined the patient, providing direct face to face  care, presenting with the chief complaint of vertigo. Physical exam findings include No nystagmus, intact cerebellar exam, and vertigo that is positional. Plan will be to try to control the symptoms here. This appears to be a peripheral process based on hx and exam. I have reviewed the nursing documentation on past medical history, family history, and social history.   Derwood Kaplan, MD 02/15/13 [phone]

## 2013-03-09 ENCOUNTER — Other Ambulatory Visit: Payer: Self-pay | Admitting: Family Medicine

## 2013-03-09 ENCOUNTER — Ambulatory Visit (INDEPENDENT_AMBULATORY_CARE_PROVIDER_SITE_OTHER): Payer: BC Managed Care – PPO | Admitting: Family Medicine

## 2013-03-09 ENCOUNTER — Encounter: Payer: Self-pay | Admitting: Family Medicine

## 2013-03-09 VITALS — BP 120/86 | HR 60 | Temp 97.7°F | Wt 116.0 lb

## 2013-03-09 DIAGNOSIS — H8110 Benign paroxysmal vertigo, unspecified ear: Secondary | ICD-10-CM

## 2013-03-09 MED ORDER — MECLIZINE HCL 25 MG PO TABS: 25.0000 mg | ORAL_TABLET | Freq: Three times a day (TID) | ORAL | Status: DC | PRN

## 2013-03-09 NOTE — Patient Instructions (Addendum)
Good to see you. Let's continue the Meclizine.  Please stop by to see Shirlee Limerick on your way out to set up your referral for PT.   Benign Positional Vertigo Vertigo means you feel like you or your surroundings are moving when they are not. Benign positional vertigo is the most common form of vertigo. Benign means that the cause of your condition is not serious. Benign positional vertigo is more common in older adults. CAUSES  Benign positional vertigo is the result of an upset in the labyrinth system. This is an area in the middle ear that helps control your balance. This may be caused by a viral infection, head injury, or repetitive motion. However, often no specific cause is found. SYMPTOMS  Symptoms of benign positional vertigo occur when you move your head or eyes in different directions. Some of the symptoms may include:  Loss of balance and falls.  Vomiting.  Blurred vision.  Dizziness.  Nausea.  Involuntary eye movements (nystagmus). DIAGNOSIS  Benign positional vertigo is usually diagnosed by physical exam. If the specific cause of your benign positional vertigo is unknown, your caregiver may perform imaging tests, such as magnetic resonance imaging (MRI) or computed tomography (CT). TREATMENT  Your caregiver may recommend movements or procedures to correct the benign positional vertigo. Medicines such as meclizine, benzodiazepines, and medicines for nausea may be used to treat your symptoms. In rare cases, if your symptoms are caused by certain conditions that affect the inner ear, you may need surgery. HOME CARE INSTRUCTIONS   Follow your caregiver's instructions.  Move slowly. Do not make sudden body or head movements.  Avoid driving.  Avoid operating heavy machinery.  Avoid performing any tasks that would be dangerous to you or others during a vertigo episode.  Drink enough fluids to keep your urine clear or pale yellow. SEEK IMMEDIATE MEDICAL CARE IF:   You develop  problems with walking, weakness, numbness, or using your arms, hands, or legs.  You have difficulty speaking.  You develop severe headaches.  Your nausea or vomiting continues or gets worse.  You develop visual changes.  Your family or friends notice any behavioral changes.  Your condition gets worse.  You have a fever.  You develop a stiff neck or sensitivity to light. MAKE SURE YOU:   Understand these instructions.  Will watch your condition.  Will get help right away if you are not doing well or get worse. Document Released: 07/05/2006 Document Revised: 12/20/2011 Document Reviewed: 06/17/2011 [facility] Patient Information 2014 [place], [place].

## 2013-03-09 NOTE — Progress Notes (Signed)
Subjective:    Patient ID: Kathy Bishop, female    DOB: Feb 17, 1951, 62 y.o.   MRN: [id number]  HPI Very pleasant 62 yo female here to discuss vertigo.  Was seen in ER for dizzy spells on Feb 14, 2013 [facility]). Notes reviewed.  Diagnosed with vertigo and given meclizine which does help when she takes it. Still having intermittent episodes.  Episodes worse when she changes head positions quickly or looks up.  Was vomiting initially when she went to ER but has not vomited since.  No UE or LE weakness. No blurred vision. No HA.  No difficulty speaking.  Normal MRI/MRA in 09/2012.  Patient Active Problem List   Diagnosis Date Noted  . BPV (benign positional vertigo) 03/09/2013  . Fatigue 02/15/2012  . Leukopenia 02/15/2012  . DERMATITIS, ALLERGIC 07/22/2010  . MENOPAUSE-RELATED VASOMOTOR SYMPTOMS, HOT FLASHES 12/26/2009  . BACK PAIN, LUMBAR, WITH RADICULOPATHY 06/17/2009  . NOCTURIA 05/06/2008  . DEPRESSION 03/02/2007   Past Medical History  Diagnosis Date  . Thoracic or lumbosacral neuritis or radiculitis, unspecified   . Depression   . Dermatitis     allergic  . Foot pain, right   . Symptomatic menopausal or female climacteric states   . Nocturia   . Need for prophylactic hormone replacement therapy (postmenopausal)   . Routine general medical examination at a health care facility    Past Surgical History  Procedure Laterality Date  . Tonsillectomy      age 17   History  Substance Use Topics  . Smoking status: Former Games developer  . Smokeless tobacco: Never Used     Comment: smoked years ago  . Alcohol Use: 4.2 oz/week    7 Glasses of wine per week   No family history on file. No Known Allergies Current Outpatient Prescriptions on File Prior to Visit  Medication Sig Dispense Refill  . escitalopram (LEXAPRO) 20 MG tablet Take 1.5 tablets (30 mg total) by mouth daily.  60 tablet  11  . estrogens, conjugated, (PREMARIN) 0.3 MG tablet 1 tablet by mouth daily.  30 tablet   11  . fluticasone (FLONASE) 50 MCG/ACT nasal spray Place 2 sprays into the nose daily.  16 g  6  . meclizine (ANTIVERT) 25 MG tablet Take 1 tablet (25 mg total) by mouth 3 (three) times daily as needed.  30 tablet  0  . ondansetron (ZOFRAN ODT) 4 MG disintegrating tablet Take 1 tablet (4 mg total) by mouth every 8 (eight) hours as needed for nausea.  15 tablet  0  . progesterone (PROMETRIUM) 100 MG capsule Take 1 capsule (100 mg total) by mouth at bedtime.  30 capsule  11  . SUMAtriptan (IMITREX) 25 MG tablet TAKE 1 TABLET (25 MG TOTAL) BY MOUTH EVERY 2 (TWO) HOURS AS NEEDED FOR MIGRAINE.  10 tablet  0   No current facility-administered medications on file prior to visit.   The PMH, PSH, Social History, Family History, Medications, and allergies have been reviewed in [facility], and have been updated if relevant.    Review of Systems See HPI    Objective:   Physical Exam BP 120/86  Pulse 60  Temp(Src) 97.7 F (36.5 C)  Wt 116 lb (52.617 kg)  BMI 20.55 kg/m2  General:  Well-developed,well-nourished,in no acute distress; alert,appropriate and cooperative throughout examination Head:  normocephalic and atraumatic.   Eyes:  vision grossly intact, pupils equal, pupils round, and pupils reactive to light.   Ears:  R ear normal and L ear normal.  No nystagmus Lungs:  Normal respiratory effort, chest expands symmetrically. Lungs are clear to auscultation, no crackles or wheezes. Heart:  Normal rate and regular rhythm. S1 and S2 normal without gallop, murmur, click, rub or other extra sounds. Msk:  No deformity or scoliosis noted of thoracic or lumbar spine.   Extremities:  No clubbing, cyanosis, edema, or deformity noted with normal full range of motion of all joints.   Neurologic:  alert & oriented X3 and gait normal.   Skin:  Intact without suspicious lesions or rashes Psych:  Cognition and judgment appear intact. Alert and cooperative with normal attention span and concentration. No apparent  delusions, illusions, hallucinations    Assessment & Plan:  1. BPV (benign positional vertigo) Intermittent. Will continue Meclizine. Refer to PT for vestibular rehab. Call or return to clinic prn if these symptoms worsen or fail to improve as anticipated. The patient indicates understanding of these issues and agrees with the plan.  - Ambulatory referral to Physical Therapy

## 2013-03-20 ENCOUNTER — Ambulatory Visit: Payer: BC Managed Care – PPO | Attending: Family Medicine | Admitting: Physical Therapy

## 2013-03-20 DIAGNOSIS — H8110 Benign paroxysmal vertigo, unspecified ear: Secondary | ICD-10-CM | POA: Insufficient documentation

## 2013-03-20 DIAGNOSIS — IMO0001 Reserved for inherently not codable concepts without codable children: Secondary | ICD-10-CM | POA: Insufficient documentation

## 2013-03-21 ENCOUNTER — Other Ambulatory Visit: Payer: Self-pay | Admitting: Family Medicine

## 2013-03-27 ENCOUNTER — Ambulatory Visit: Payer: BC Managed Care – PPO | Admitting: Physical Therapy

## 2013-04-03 ENCOUNTER — Ambulatory Visit: Payer: BC Managed Care – PPO | Admitting: Physical Therapy

## 2013-04-22 ENCOUNTER — Other Ambulatory Visit: Payer: Self-pay | Admitting: Family Medicine

## 2013-07-12 ENCOUNTER — Ambulatory Visit (INDEPENDENT_AMBULATORY_CARE_PROVIDER_SITE_OTHER): Payer: BC Managed Care – PPO | Admitting: Family Medicine

## 2013-07-12 ENCOUNTER — Encounter: Payer: Self-pay | Admitting: Family Medicine

## 2013-07-12 ENCOUNTER — Other Ambulatory Visit: Payer: Self-pay | Admitting: Family Medicine

## 2013-07-12 VITALS — BP 136/82 | HR 54 | Temp 97.9°F | Ht 63.5 in | Wt 121.0 lb

## 2013-07-12 DIAGNOSIS — M76899 Other specified enthesopathies of unspecified lower limb, excluding foot: Secondary | ICD-10-CM

## 2013-07-12 DIAGNOSIS — M6790 Unspecified disorder of synovium and tendon, unspecified site: Secondary | ICD-10-CM

## 2013-07-12 DIAGNOSIS — M67952 Unspecified disorder of synovium and tendon, left thigh: Secondary | ICD-10-CM

## 2013-07-12 DIAGNOSIS — M7062 Trochanteric bursitis, left hip: Secondary | ICD-10-CM

## 2013-07-12 DIAGNOSIS — M7072 Other bursitis of hip, left hip: Secondary | ICD-10-CM

## 2013-07-12 NOTE — Progress Notes (Signed)
Nature conservation officer at [facility] [address] [place] [address] Phone: [phone] Fax: [phone]  Date:  07/12/2013   Name:  Kathy Bishop   DOB:  1951/05/16   MRN:  [id number] Gender: female Age: 62 y.o.  Primary Physician:  Ruthe Mannan, MD  Evaluating MD: Hannah Beat, MD   Chief Complaint: Hip Pain   History of Present Illness:  Kathy Bishop is a 62 y.o. pleasant patient who presents with the following:  Last Friday, was having pain and it hurt to touch in the posterior buttocks and low back. Pain in lateral hip and groin also.  Greatest pain is on the lateral thigh and hip on the left. Also with posterior buttocks pain.  Ran a 5k obstacle course - mud run a couple of weeks ago. Does cardio, runs and walks on the treadmill.  Fell and hit shin.  Has run 4 5k's in the last year.  No trauma or accident.  Touching skin hurts some.     Patient Active Problem List   Diagnosis Date Noted  . BPV (benign positional vertigo) 03/09/2013  . Fatigue 02/15/2012  . Leukopenia 02/15/2012  . DERMATITIS, ALLERGIC 07/22/2010  . MENOPAUSE-RELATED VASOMOTOR SYMPTOMS, HOT FLASHES 12/26/2009  . BACK PAIN, LUMBAR, WITH RADICULOPATHY 06/17/2009  . NOCTURIA 05/06/2008  . DEPRESSION 03/02/2007    Past Medical History  Diagnosis Date  . Thoracic or lumbosacral neuritis or radiculitis, unspecified   . Depression   . Dermatitis     allergic  . Foot pain, right   . Symptomatic menopausal or female climacteric states   . Nocturia   . Need for prophylactic hormone replacement therapy (postmenopausal)   . Routine general medical examination at a health care facility     Past Surgical History  Procedure Laterality Date  . Tonsillectomy      age 44    History   Social History  . Marital Status: Married    Spouse Name: N/A    Number of Children: 0  . Years of Education: N/A   Occupational History  .  [employer]    works in the Engineer, civil (consulting)- support   Social  History Main Topics  . Smoking status: Former Games developer  . Smokeless tobacco: Never Used     Comment: smoked years ago  . Alcohol Use: 4.2 oz/week    7 Glasses of wine per week  . Drug Use: No  . Sexual Activity: Not on file   Other Topics Concern  . Not on file   Social History Narrative  . No narrative on file    No family history on file.  No Known Allergies  Medication list has been reviewed and updated.  Outpatient Prescriptions Prior to Visit  Medication Sig Dispense Refill  . escitalopram (LEXAPRO) 20 MG tablet TAKE 1.5 TABLETS DAILY.  60 tablet  3  . meclizine (ANTIVERT) 25 MG tablet Take 1 tablet (25 mg total) by mouth 3 (three) times daily as needed for dizziness.  90 tablet  0  . ondansetron (ZOFRAN ODT) 4 MG disintegrating tablet Take 1 tablet (4 mg total) by mouth every 8 (eight) hours as needed for nausea.  15 tablet  0  . progesterone (PROMETRIUM) 100 MG capsule TAKE 1 CAPSULE NIGHTLY AT BEDTIME  30 capsule  5  . SUMAtriptan (IMITREX) 25 MG tablet TAKE 1 TABLET (25 MG TOTAL) BY MOUTH EVERY 2 (TWO) HOURS AS NEEDED FOR MIGRAINE.  10 tablet  0  . PREMARIN 0.3 MG  tablet TAKE 1 TABLET DAILY  30 tablet  3  . fluticasone (FLONASE) 50 MCG/ACT nasal spray Place 2 sprays into the nose daily.  16 g  6   No facility-administered medications prior to visit.    Review of Systems:   GEN: No fevers, chills. Nontoxic. Primarily MSK c/o today. MSK: Detailed in the HPI GI: tolerating PO intake without difficulty Neuro: No numbness, parasthesias, or tingling associated. Otherwise the pertinent positives of the ROS are noted above.    Physical Examination: BP 136/82  Pulse 54  Temp(Src) 97.9 F (36.6 C) (Oral)  Ht 5' 3.5" (1.613 m)  Wt 121 lb (54.885 kg)  BMI 21.1 kg/m2  Ideal Body Weight: Weight in (lb) to have BMI = 25: 143.1   GEN: WDWN, NAD, Non-toxic, Alert & Oriented x 3 HEENT: Atraumatic, Normocephalic.  Ears and Nose: No external deformity. EXTR: No  clubbing/cyanosis/edema NEURO: Normal gait.  PSYCH: Normally interactive. Conversant. Not depressed or anxious appearing.  Calm demeanor.   HIP EXAM: SIDE: L ROM: Abduction, Flexion, Internal and External range of motion: full Pain with terminal IROM and EROM: minimal GTB: TTP on the L TTP posterior pelvic rim TTP with flexion and adduction also SLR: NEG Knees: No effusion FABER: NT REVERSE FABER: NT, neg Piriformis: NT at direct palpation Str: flexion: 5/5 abduction: 5/5, mild tend L adduction: 5/5, mild tend L     Assessment and Plan:  Trochanteric bursitis, left  Hip bursitis, left  Tendinopathy of left gluteus medius  Suspect overuse with GTB, glute medius tendinopathy, posterior pelvic rim bursitis as well. [facility] rehab. Cross-train some over next week  Trochanteric Bursitis Injection, LEFT Verbal consent obtained. Risks (including infection, potential atrophy), benefits, and alternatives reviewed. Greater trochanter sterilely prepped with Chloraprep. Ethyl Chloride used for anesthesia. 8 cc of Lidocaine 1% injected with 2 1/2 cc of 40 mg Depo-Medrol into trochanteric bursa at area of maximal tenderness at greater trochanter. 6 cc into the GTB and 2 cc each into 2 areas of maximal tenderness  Needle taken to bone to troch bursa and to the pelvic rim, flows easily. Bursa massaged. No bleeding and no complications. Decreased pain after injection. Needle: 22 gauge 1 1/2 inch  Orders Today:  No orders of the defined types were placed in this encounter.    Updated Medication List: (Includes new medications, updates to list, dose adjustments) No orders of the defined types were placed in this encounter.    Medications Discontinued: Medications Discontinued During This Encounter  Medication Reason  . fluticasone (FLONASE) 50 MCG/ACT nasal spray Patient Preference      Signed, Kelia Gibbon T. Racquelle Hyser, MD 07/12/2013 10:18 AM

## 2013-08-17 ENCOUNTER — Other Ambulatory Visit: Payer: Self-pay | Admitting: Family Medicine

## 2013-08-20 ENCOUNTER — Other Ambulatory Visit: Payer: Self-pay | Admitting: Family Medicine

## 2013-08-20 NOTE — Telephone Encounter (Signed)
Received refill request electronically. Last office visit 07/12/13 (acute visit). Is it okay to refill medication?

## 2013-09-18 ENCOUNTER — Other Ambulatory Visit: Payer: Self-pay | Admitting: Family Medicine

## 2013-11-14 ENCOUNTER — Other Ambulatory Visit: Payer: Self-pay | Admitting: Internal Medicine

## 2013-11-14 NOTE — Telephone Encounter (Signed)
Last filled 09/03/13--please advise

## 2013-11-19 ENCOUNTER — Other Ambulatory Visit: Payer: Self-pay | Admitting: Family Medicine

## 2014-01-19 ENCOUNTER — Other Ambulatory Visit: Payer: Self-pay | Admitting: Family Medicine

## 2014-01-21 NOTE — Telephone Encounter (Signed)
Pt requesting medication refill. Last ov 02/2013 with no future appts scheduled. pls advise

## 2014-01-22 NOTE — Telephone Encounter (Signed)
Ok to refill one time only.  Needs appt for further refills.

## 2014-02-04 ENCOUNTER — Other Ambulatory Visit: Payer: Self-pay | Admitting: Family Medicine

## 2014-03-13 ENCOUNTER — Other Ambulatory Visit: Payer: Self-pay | Admitting: Family Medicine

## 2014-03-13 NOTE — Telephone Encounter (Signed)
Pt requesting medication refill. Last f/u appt 02/2012 with no future appts scheduled. pls advise

## 2014-03-13 NOTE — Telephone Encounter (Signed)
rx declined because she has not been seen in over 2 years.  If she is planning on making a follow up appt, ok to refill only enough until appt

## 2014-03-13 NOTE — Telephone Encounter (Signed)
Lm on pts vm requesting a call back 

## 2014-03-16 ENCOUNTER — Other Ambulatory Visit: Payer: Self-pay | Admitting: Family Medicine

## 2014-03-18 NOTE — Telephone Encounter (Signed)
Lm on pts vm informing her Rx has been denied for second time and she will need an ov for any/all refills

## 2014-03-18 NOTE — Telephone Encounter (Signed)
Pt requesting medication refill. Last f/u ov 02/2014 with no future appts scheduled. pls advise

## 2014-04-11 ENCOUNTER — Other Ambulatory Visit: Payer: Self-pay | Admitting: Family Medicine

## 2014-04-11 NOTE — Telephone Encounter (Signed)
Spoke to pt and informed her that an OV is required for additional refills; also indicated on last refill. Pt states that she is not wanting to schedule a f/u and will contact office when she does. She states that she "will just go without" as she does not have time.

## 2014-05-06 ENCOUNTER — Encounter: Payer: Self-pay | Admitting: Internal Medicine

## 2014-05-06 ENCOUNTER — Ambulatory Visit (INDEPENDENT_AMBULATORY_CARE_PROVIDER_SITE_OTHER): Payer: BC Managed Care – PPO | Admitting: Internal Medicine

## 2014-05-06 VITALS — BP 108/74 | HR 50 | Temp 98.0°F | Wt 120.0 lb

## 2014-05-06 DIAGNOSIS — F3289 Other specified depressive episodes: Secondary | ICD-10-CM

## 2014-05-06 DIAGNOSIS — F329 Major depressive disorder, single episode, unspecified: Secondary | ICD-10-CM

## 2014-05-06 DIAGNOSIS — F32A Depression, unspecified: Secondary | ICD-10-CM

## 2014-05-06 LAB — CBC
HCT: 40.3 % (ref 36.0–46.0)
Hemoglobin: 13.6 g/dL (ref 12.0–15.0)
MCHC: 33.7 g/dL (ref 30.0–36.0)
MCV: 96.7 fl (ref 78.0–100.0)
PLATELETS: 205 10*3/uL (ref 150.0–400.0)
RBC: 4.16 Mil/uL (ref 3.87–5.11)
RDW: 12.6 % (ref 11.5–15.5)
WBC: 5.2 10*3/uL (ref 4.0–10.5)

## 2014-05-06 LAB — COMPREHENSIVE METABOLIC PANEL
ALBUMIN: 4.2 g/dL (ref 3.5–5.2)
ALT: 18 U/L (ref 0–35)
AST: 19 U/L (ref 0–37)
Alkaline Phosphatase: 44 U/L (ref 39–117)
BUN: 16 mg/dL (ref 6–23)
CALCIUM: 9.3 mg/dL (ref 8.4–10.5)
CHLORIDE: 107 meq/L (ref 96–112)
CO2: 32 mEq/L (ref 19–32)
Creatinine, Ser: 0.7 mg/dL (ref 0.4–1.2)
GFR: 89.96 mL/min (ref >60.00)
Glucose, Bld: 87 mg/dL (ref 70–99)
POTASSIUM: 4.6 meq/L (ref 3.5–5.1)
SODIUM: 141 meq/L (ref 135–145)
TOTAL PROTEIN: 6.6 g/dL (ref 6.0–8.3)
Total Bilirubin: 0.7 mg/dL (ref 0.2–1.2)

## 2014-05-06 MED ORDER — ESCITALOPRAM OXALATE 20 MG PO TABS: ORAL_TABLET | ORAL | Status: DC

## 2014-05-06 NOTE — Progress Notes (Signed)
Subjective:    Patient ID: Kathy Bishop, female    DOB: [birth year], 63 y.o.   MRN: [id number]  HPI  Pt presents to the clinic today for medication refill. She is in need of her Lexapro. She takes it for depression. She reports that it works very well for her. She tolerates it well without side effects. She denies SI/HI. She reports that she has been out of her medication for the past 2 weeks. She has felt a little more fatigue and some mild nausea but no increase in depression.  Review of Systems      Past Medical History  Diagnosis Date  . Thoracic or lumbosacral neuritis or radiculitis, unspecified   . Depression   . Dermatitis     allergic  . Foot pain, right   . Symptomatic menopausal or female climacteric states   . Nocturia   . Need for prophylactic hormone replacement therapy (postmenopausal)   . Routine general medical examination at a health care facility     Current Outpatient Prescriptions  Medication Sig Dispense Refill  . escitalopram (LEXAPRO) 20 MG tablet TAKE 1 AND 1/2 TABLETS DAILY.  45 tablet  0  . escitalopram (LEXAPRO) 20 MG tablet TAKE 1 AND 1/2 TABLETS DAILY.  45 tablet  0  . meclizine (ANTIVERT) 25 MG tablet Take 1 tablet (25 mg total) by mouth 3 (three) times daily as needed for dizziness.  90 tablet  0  . ondansetron (ZOFRAN ODT) 4 MG disintegrating tablet Take 1 tablet (4 mg total) by mouth every 8 (eight) hours as needed for nausea.  15 tablet  0  . PREMARIN 0.3 MG tablet TAKE 1 TABLET BY MOUTH EVERY DAY  30 tablet  0  . progesterone (PROMETRIUM) 100 MG capsule TAKE 1 CAPSULE NIGHTLY AT BEDTIME  30 capsule  5  . SUMAtriptan (IMITREX) 25 MG tablet TAKE 1 TABLET BY MOUTH EVERY 2 HOURS AS NEEDED FOR MIGRAINE.  10 tablet  0   No current facility-administered medications for this visit.    No Known Allergies  No family history on file.  History   Social History  . Marital Status: Married    Spouse Name: N/A    Number of Children: 0  . Years of  Education: N/A   Occupational History  .  [employer]    works in the Physicist, medical- support   Social History Main Topics  . Smoking status: Former Research scientist (life sciences)  . Smokeless tobacco: Never Used     Comment: smoked years ago  . Alcohol Use: 4.2 oz/week    7 Glasses of wine per week  . Drug Use: No  . Sexual Activity: Not on file   Other Topics Concern  . Not on file   Social History Narrative  . No narrative on file     Constitutional: Pt reports fatigue. Denies fever, malaise, headache or abrupt weight changes.  Respiratory: Denies difficulty breathing, shortness of breath, cough or sputum production.   Cardiovascular: Denies chest pain, chest tightness, palpitations or swelling in the hands or feet.  Gastrointestinal: Pt reports nausea. Denies abdominal pain, bloating, constipation, diarrhea or blood in the stool.  Psych: Pt reports depression. Denies SI/HI.  No other specific complaints in a complete review of systems (except as listed in HPI above).  Objective:   Physical Exam   BP 108/74  Pulse 50  Temp(Src) 98 F (36.7 C) (Oral)  Wt 120 lb (54.432 kg)  SpO2 98% Wt Readings from Last 3 Encounters:  05/06/14 120 lb (54.432 kg)  07/12/13 121 lb (54.885 kg)  03/09/13 116 lb (52.617 kg)    General: Appears her stated age, well developed, well nourished in NAD. Cardiovascular: Normal rate and rhythm. S1,S2 noted.  No murmur, rubs or gallops noted. No JVD or BLE edema. No carotid bruits noted. Pulmonary/Chest: Normal effort and positive vesicular breath sounds. No respiratory distress. No wheezes, rales or ronchi noted.  Abdomen: Soft and nontender. Normal bowel sounds, no bruits noted. No distention or masses noted. Liver, spleen and kidneys non palpable. Psychiatric: Mood and affect normal. Behavior is normal. Judgment and thought content normal.     BMET    Component Value Date/Time   NA 138 02/14/2013 0658   K 3.5 02/14/2013 0658   CL 104 02/14/2013 0658   CO2 21  02/14/2013 0658   GLUCOSE 120* 02/14/2013 0658   BUN 18 02/14/2013 0658   CREATININE 0.64 02/14/2013 0658   CALCIUM 9.5 02/14/2013 0658   GFRNONAA >90 02/14/2013 0658   GFRAA >90 02/14/2013 0658    Lipid Panel     Component Value Date/Time   CHOL 173 06/17/2009 1008   TRIG 62.0 06/17/2009 1008   HDL 78.20 06/17/2009 1008   CHOLHDL 2 06/17/2009 1008   VLDL 12.4 06/17/2009 1008   LDLCALC 82 06/17/2009 1008    CBC    Component Value Date/Time   WBC 6.6 02/14/2013 0658   RBC 4.35 02/14/2013 0658   HGB 13.7 02/14/2013 0658   HCT 40.1 02/14/2013 0658   PLT 170 02/14/2013 0658   MCV 92.2 02/14/2013 0658   MCH 31.5 02/14/2013 0658   MCHC 34.2 02/14/2013 0658   RDW 12.1 02/14/2013 0658   LYMPHSABS 0.8 02/14/2013 0658   MONOABS 0.3 02/14/2013 0658   EOSABS 0.0 02/14/2013 0658   BASOSABS 0.0 02/14/2013 0658    Hgb A1C No results found for this basename: HGBA1C        Assessment & Plan:

## 2014-05-06 NOTE — Patient Instructions (Signed)
Major Depressive Disorder °Major depressive disorder is a mental illness. It also may be called clinical depression or unipolar depression. Major depressive disorder usually causes feelings of sadness, hopelessness, or helplessness. Some people with this disorder do not feel particularly sad but lose interest in doing things they used to enjoy (anhedonia). Major depressive disorder also can cause physical symptoms. It can interfere with work, school, relationships, and other normal everyday activities. The disorder varies in severity but is longer lasting and more serious than the sadness we all feel from time to time in our lives. °Major depressive disorder often is triggered by stressful life events or major life changes. Examples of these triggers include divorce, loss of your job or home, a move, and the death of a family member or close friend. Sometimes this disorder occurs for no obvious reason at all. People who have family members with major depressive disorder or bipolar disorder are at higher risk for developing this disorder, with or without life stressors. Major depressive disorder can occur at any age. It may occur just once in your life (single episode major depressive disorder). It may occur multiple times (recurrent major depressive disorder). °SYMPTOMS °People with major depressive disorder have either anhedonia or depressed mood on nearly a daily basis for at least 2 weeks or longer. Symptoms of depressed mood include: °· Feelings of sadness (blue or down in the dumps) or emptiness. °· Feelings of hopelessness or helplessness. °· Tearfulness or episodes of crying (may be observed by others). °· Irritability (children and adolescents). °In addition to depressed mood or anhedonia or both, people with this disorder have at least four of the following symptoms: °· Difficulty sleeping or sleeping too much.   °· Significant change (increase or decrease) in appetite or weight.   °· Lack of energy or  motivation. °· Feelings of guilt and worthlessness.   °· Difficulty concentrating, remembering, or making decisions. °· Unusually slow movement (psychomotor retardation) or restlessness (as observed by others).   °· Recurrent wishes for death, recurrent thoughts of self-harm (suicide), or a suicide attempt. °People with major depressive disorder commonly have persistent negative thoughts about themselves, other people, and the world. People with severe major depressive disorder may experience distorted beliefs or perceptions about the world (psychotic delusions). They also may see or hear things that are not real (psychotic hallucinations). °DIAGNOSIS °Major depressive disorder is diagnosed through an assessment by your health care provider. Your health care provider will ask about aspects of your daily life, such as mood, sleep, and appetite, to see if you have the diagnostic symptoms of major depressive disorder. Your health care provider may ask about your medical history and use of alcohol or drugs, including prescription medicines. Your health care provider also may do a physical exam and blood work. This is because certain medical conditions and the use of certain substances can cause major depressive disorder-like symptoms (secondary depression). Your health care provider also may refer you to a mental health specialist for further evaluation and treatment. °TREATMENT °It is important to recognize the symptoms of major depressive disorder and seek treatment. The following treatments can be prescribed for this disorder:   °· Medicine. Antidepressant medicines usually are prescribed. Antidepressant medicines are thought to correct chemical imbalances in the brain that are commonly associated with major depressive disorder. Other types of medicine may be added if the symptoms do not respond to antidepressant medicines alone or if psychotic delusions or hallucinations occur. °· Talk therapy. Talk therapy can be  helpful in treating major depressive disorder by providing   support, education, and guidance. Certain types of talk therapy also can help with negative thinking (cognitive behavioral therapy) and with relationship issues that trigger this disorder (interpersonal therapy). °A mental health specialist can help determine which treatment is best for you. Most people with major depressive disorder do well with a combination of medicine and talk therapy. Treatments involving electrical stimulation of the brain can be used in situations with extremely severe symptoms or when medicine and talk therapy do not work over time. These treatments include electroconvulsive therapy, transcranial magnetic stimulation, and vagal nerve stimulation. °Document Released: 01/22/2013 Document Revised: 02/11/2014 Document Reviewed: 01/22/2013 °ExitCare® Patient Information ©2015 [employer]. This information is not intended to replace advice given to you by your health care provider. Make sure you discuss any questions you have with your health care provider. ° °

## 2014-05-06 NOTE — Progress Notes (Signed)
Pre visit review using our clinic review tool, if applicable. No additional management support is needed unless otherwise documented below in the visit note. 

## 2014-05-06 NOTE — Assessment & Plan Note (Signed)
Will check CBC and CMET today Lexapro refilled x 6 months

## 2014-05-20 ENCOUNTER — Other Ambulatory Visit: Payer: Self-pay | Admitting: Family Medicine

## 2015-01-14 ENCOUNTER — Other Ambulatory Visit: Payer: Self-pay

## 2015-01-14 DIAGNOSIS — F32A Depression, unspecified: Secondary | ICD-10-CM

## 2015-01-14 DIAGNOSIS — F329 Major depressive disorder, single episode, unspecified: Secondary | ICD-10-CM

## 2015-01-14 MED ORDER — ESCITALOPRAM OXALATE 20 MG PO TABS: ORAL_TABLET | ORAL | Status: DC

## 2015-04-11 ENCOUNTER — Other Ambulatory Visit: Payer: Self-pay | Admitting: Family Medicine

## 2015-04-21 ENCOUNTER — Encounter: Payer: Self-pay | Admitting: Family Medicine

## 2015-04-21 ENCOUNTER — Ambulatory Visit (INDEPENDENT_AMBULATORY_CARE_PROVIDER_SITE_OTHER): Payer: BC Managed Care – PPO | Admitting: Family Medicine

## 2015-04-21 VITALS — BP 120/80 | HR 78 | Temp 97.8°F | Wt 121.0 lb

## 2015-04-21 DIAGNOSIS — N951 Menopausal and female climacteric states: Secondary | ICD-10-CM | POA: Diagnosis not present

## 2015-04-21 DIAGNOSIS — F32A Depression, unspecified: Secondary | ICD-10-CM

## 2015-04-21 DIAGNOSIS — F329 Major depressive disorder, single episode, unspecified: Secondary | ICD-10-CM

## 2015-04-21 MED ORDER — ESTROGENS CONJUGATED 0.3 MG PO TABS: 0.3000 mg | ORAL_TABLET | Freq: Every day | ORAL | Status: DC

## 2015-04-21 MED ORDER — ESCITALOPRAM OXALATE 20 MG PO TABS: ORAL_TABLET | ORAL | Status: DC

## 2015-04-21 MED ORDER — PROGESTERONE MICRONIZED 100 MG PO CAPS: ORAL_CAPSULE | ORAL | Status: DC

## 2015-04-21 NOTE — Assessment & Plan Note (Signed)
Well controlled on current dose of lexapro.  eRx sent.

## 2015-04-21 NOTE — Assessment & Plan Note (Addendum)
>  15 min spent with face to face with patient, >50% counseling and/or coordinating care Discussed risks and benefits of HRT.  Pt has a uterus and needs progesterone with estrogen to prevent endometrial hyperplasia (20-50% of patients on unopposed estrogen for 1 yr will develop hyperplasia). Start Prometrium 100 mg qhs with Premarin 0.3 mg daily.

## 2015-04-21 NOTE — Progress Notes (Signed)
Subjective:    Patient ID: Kathy Bishop, female    DOB: October 10, 1951, 64 y.o.   MRN: [id number]  HPI  64 yo here for follow up.  Depression- Lexapro 30 mg daily is working well.  Denies any symptoms of depression or anxiety.  Having many more hot flashes since she stopped taking premarin/prometrium last year.  She did not have any negative side effects and she would like to restart it.  Patient Active Problem List   Diagnosis Date Noted  . BPV (benign positional vertigo) 03/09/2013  . Fatigue 02/15/2012  . Leukopenia 02/15/2012  . DERMATITIS, ALLERGIC 07/22/2010  . MENOPAUSE-RELATED VASOMOTOR SYMPTOMS, HOT FLASHES 12/26/2009  . BACK PAIN, LUMBAR, WITH RADICULOPATHY 06/17/2009  . NOCTURIA 05/06/2008  . Depression 03/02/2007   Past Medical History  Diagnosis Date  . Thoracic or lumbosacral neuritis or radiculitis, unspecified   . Depression   . Dermatitis     allergic  . Foot pain, right   . Symptomatic menopausal or female climacteric states   . Nocturia   . Need for prophylactic hormone replacement therapy (postmenopausal)   . Routine general medical examination at a health care facility    Past Surgical History  Procedure Laterality Date  . Tonsillectomy      age 7   History  Substance Use Topics  . Smoking status: Former Research scientist (life sciences)  . Smokeless tobacco: Never Used     Comment: smoked years ago  . Alcohol Use: 4.2 oz/week    7 Glasses of wine per week     Comment: occasional   History reviewed. No pertinent family history. No Known Allergies Current Outpatient Prescriptions on File Prior to Visit  Medication Sig Dispense Refill  . meclizine (ANTIVERT) 25 MG tablet Take 1 tablet (25 mg total) by mouth 3 (three) times daily as needed for dizziness. 90 tablet 0  . ondansetron (ZOFRAN ODT) 4 MG disintegrating tablet Take 1 tablet (4 mg total) by mouth every 8 (eight) hours as needed for nausea. 15 tablet 0  . SUMAtriptan (IMITREX) 25 MG tablet TAKE 1 TABLET BY MOUTH EVERY  2 HOURS AS NEEDED FOR MIGRAINE. 10 tablet 0   No current facility-administered medications on file prior to visit.   The PMH, PSH, Social History, Family History, Medications, and allergies have been reviewed in [facility], and have been updated if relevant.    Review of Systems  Constitutional: Negative.   Genitourinary: Negative for dysuria, urgency, hematuria, flank pain, decreased urine volume, vaginal bleeding, vaginal discharge, enuresis, difficulty urinating, genital sores, vaginal pain, menstrual problem, pelvic pain and dyspareunia.  Psychiatric/Behavioral: Negative for suicidal ideas, hallucinations, behavioral problems, confusion, sleep disturbance, self-injury, dysphoric mood and decreased concentration. The patient is not nervous/anxious and is not hyperactive.   All other systems reviewed and are negative.      Objective:   Physical Exam  Constitutional: She is oriented to person, place, and time. She appears well-developed and well-nourished. No distress.  HENT:  Head: Normocephalic.  Eyes: Conjunctivae are normal.  Neck: Normal range of motion.  Cardiovascular: Normal rate.   Pulmonary/Chest: Effort normal.  Musculoskeletal: Normal range of motion.  Neurological: She is alert and oriented to person, place, and time. No cranial nerve deficit.  Skin: Skin is warm and dry.  Psychiatric: She has a normal mood and affect. Her behavior is normal. Judgment and thought content normal.  Nursing note and vitals reviewed.  BP 120/80 mmHg  Pulse 78  Temp(Src) 97.8 F (36.6 C) (Oral)  Wt 121 lb (  54.885 kg)      Assessment & Plan:

## 2015-04-21 NOTE — Progress Notes (Signed)
Pre visit review using our clinic review tool, if applicable. No additional management support is needed unless otherwise documented below in the visit note. 

## 2015-05-01 ENCOUNTER — Telehealth: Payer: Self-pay

## 2015-05-01 NOTE — Telephone Encounter (Signed)
Left a message for patient to return my call about having a Mammogram set up. Will await call back.  

## 2016-04-25 ENCOUNTER — Other Ambulatory Visit: Payer: Self-pay | Admitting: Family Medicine

## 2016-05-31 ENCOUNTER — Other Ambulatory Visit: Payer: Self-pay | Admitting: Family Medicine

## 2016-07-07 ENCOUNTER — Ambulatory Visit (INDEPENDENT_AMBULATORY_CARE_PROVIDER_SITE_OTHER): Payer: BC Managed Care – PPO | Admitting: Family Medicine

## 2016-07-07 ENCOUNTER — Encounter: Payer: Self-pay | Admitting: Family Medicine

## 2016-07-07 VITALS — BP 116/68 | HR 62 | Temp 98.2°F | Wt 114.2 lb

## 2016-07-07 DIAGNOSIS — F329 Major depressive disorder, single episode, unspecified: Secondary | ICD-10-CM

## 2016-07-07 DIAGNOSIS — F32A Depression, unspecified: Secondary | ICD-10-CM

## 2016-07-07 DIAGNOSIS — N951 Menopausal and female climacteric states: Secondary | ICD-10-CM | POA: Diagnosis not present

## 2016-07-07 MED ORDER — ESTROGENS CONJUGATED 0.3 MG PO TABS: 0.3000 mg | ORAL_TABLET | Freq: Every day | ORAL | 2 refills | Status: DC

## 2016-07-07 MED ORDER — ESCITALOPRAM OXALATE 20 MG PO TABS: ORAL_TABLET | ORAL | 2 refills | Status: DC

## 2016-07-07 MED ORDER — PROGESTERONE MICRONIZED 100 MG PO CAPS: 100.0000 mg | ORAL_CAPSULE | Freq: Every day | ORAL | 2 refills | Status: DC

## 2016-07-07 NOTE — Assessment & Plan Note (Signed)
Continue premarin with prometrium since she does still have a uterus. No changes made today. She will schedule an appt for CPX.

## 2016-07-07 NOTE — Progress Notes (Signed)
Subjective:    Patient ID: Kathy Bishop, female    DOB: 1951/04/29, 65 y.o.   MRN: [id number]  HPI  65 yo here for follow up.  Depression- Lexapro 30 mg daily is working well.  Denies any symptoms of depression or anxiety.  Feels good.  Has lost weight because she is making positive changes- exercising at least 5 days per week, cut out wine.  Premarin and prometrium working well.  Denies any post menopausal bleeding.  Lab Results  Component Value Date   NA 141 05/06/2014   K 4.6 05/06/2014   CL 107 05/06/2014   CO2 32 05/06/2014   Lab Results  Component Value Date   ALT 18 05/06/2014   AST 19 05/06/2014   ALKPHOS 44 05/06/2014   BILITOT 0.7 05/06/2014    Patient Active Problem List  Diagnosis  . Depression  . MENOPAUSE-RELATED VASOMOTOR SYMPTOMS, HOT FLASHES  . DERMATITIS, ALLERGIC  . BACK PAIN, LUMBAR, WITH RADICULOPATHY  . NOCTURIA  . Fatigue  . Leukopenia  . BPV (benign positional vertigo)   Past Medical History:  Diagnosis Date  . Depression   . Dermatitis    allergic  . Foot pain, right   . Need for prophylactic hormone replacement therapy (postmenopausal)   . Nocturia   . Routine general medical examination at a health care facility   . Symptomatic menopausal or female climacteric states   . Thoracic or lumbosacral neuritis or radiculitis, unspecified    Past Surgical History:  Procedure Laterality Date  . TONSILLECTOMY     age 41   Social History  Substance Use Topics  . Smoking status: Former Research scientist (life sciences)  . Smokeless tobacco: Never Used     Comment: smoked years ago  . Alcohol use 4.2 oz/week    7 Glasses of wine per week     Comment: occasional   No family history on file. No Known Allergies No current outpatient prescriptions on file prior to visit.   No current facility-administered medications on file prior to visit.    The PMH, PSH, Social History, Family History, Medications, and allergies have been reviewed in [facility], and have been updated  if relevant.    Review of Systems  Constitutional: Negative.   Genitourinary: Negative for decreased urine volume, difficulty urinating, dyspareunia, dysuria, enuresis, flank pain, genital sores, hematuria, menstrual problem, pelvic pain, urgency, vaginal bleeding, vaginal discharge and vaginal pain.  Psychiatric/Behavioral: Negative for behavioral problems, confusion, decreased concentration, dysphoric mood, hallucinations, self-injury, sleep disturbance and suicidal ideas. The patient is not nervous/anxious and is not hyperactive.   All other systems reviewed and are negative.      Objective:   Physical Exam  Constitutional: She is oriented to person, place, and time. She appears well-developed and well-nourished. No distress.  HENT:  Head: Normocephalic.  Eyes: Conjunctivae are normal.  Neck: Normal range of motion.  Cardiovascular: Normal rate.   Pulmonary/Chest: Effort normal.  Musculoskeletal: Normal range of motion.  Neurological: She is alert and oriented to person, place, and time. No cranial nerve deficit.  Skin: Skin is warm and dry.  Psychiatric: She has a normal mood and affect. Her behavior is normal. Judgment and thought content normal.  Nursing note and vitals reviewed.  BP 116/68   Pulse 62   Temp 98.2 F (36.8 C) (Oral)   Wt 114 lb 4 oz (51.8 kg)   SpO2 98%   BMI 19.92 kg/m   Wt Readings from Last 3 Encounters:  07/07/16 114 lb 4 oz (  51.8 kg)  04/21/15 121 lb (54.9 kg)  05/06/14 120 lb (54.4 kg)       Assessment & Plan:

## 2016-07-07 NOTE — Progress Notes (Signed)
Pre visit review using our clinic review tool, if applicable. No additional management support is needed unless otherwise documented below in the visit note. 

## 2016-07-07 NOTE — Assessment & Plan Note (Signed)
Well controlled on current dose of lexapro. No changes made today. 

## 2016-08-03 ENCOUNTER — Ambulatory Visit (INDEPENDENT_AMBULATORY_CARE_PROVIDER_SITE_OTHER): Payer: BC Managed Care – PPO | Admitting: Family Medicine

## 2016-08-03 ENCOUNTER — Encounter: Payer: Self-pay | Admitting: Family Medicine

## 2016-08-03 ENCOUNTER — Other Ambulatory Visit (HOSPITAL_COMMUNITY)
Admission: RE | Admit: 2016-08-03 | Discharge: 2016-08-03 | Disposition: A | Discharge status: Home / Self Care | Payer: BC Managed Care – PPO | Source: Ambulatory Visit | Attending: Family Medicine | Admitting: Family Medicine

## 2016-08-03 VITALS — BP 114/82 | HR 63 | Temp 97.7°F | Ht 63.5 in | Wt 117.2 lb

## 2016-08-03 DIAGNOSIS — N951 Menopausal and female climacteric states: Secondary | ICD-10-CM

## 2016-08-03 DIAGNOSIS — Z1159 Encounter for screening for other viral diseases: Secondary | ICD-10-CM | POA: Diagnosis not present

## 2016-08-03 DIAGNOSIS — F32A Depression, unspecified: Secondary | ICD-10-CM

## 2016-08-03 DIAGNOSIS — F329 Major depressive disorder, single episode, unspecified: Secondary | ICD-10-CM

## 2016-08-03 DIAGNOSIS — Z01419 Encounter for gynecological examination (general) (routine) without abnormal findings: Secondary | ICD-10-CM | POA: Diagnosis present

## 2016-08-03 DIAGNOSIS — Z1151 Encounter for screening for human papillomavirus (HPV): Secondary | ICD-10-CM | POA: Insufficient documentation

## 2016-08-03 DIAGNOSIS — Z1211 Encounter for screening for malignant neoplasm of colon: Secondary | ICD-10-CM

## 2016-08-03 DIAGNOSIS — Z0000 Encounter for general adult medical examination without abnormal findings: Secondary | ICD-10-CM

## 2016-08-03 LAB — CBC WITH DIFFERENTIAL/PLATELET
BASOS PCT: 0.4 % (ref 0.0–3.0)
Basophils Absolute: 0 10*3/uL (ref 0.0–0.1)
EOS ABS: 0.1 10*3/uL (ref 0.0–0.7)
Eosinophils Relative: 2.1 % (ref 0.0–5.0)
HCT: 40.7 % (ref 36.0–46.0)
Hemoglobin: 13.7 g/dL (ref 12.0–15.0)
Lymphocytes Relative: 32.9 % (ref 12.0–46.0)
Lymphs Abs: 1.4 10*3/uL (ref 0.7–4.0)
MCHC: 33.8 g/dL (ref 30.0–36.0)
MCV: 95.4 fl (ref 78.0–100.0)
MONO ABS: 0.3 10*3/uL (ref 0.1–1.0)
Monocytes Relative: 6.2 % (ref 3.0–12.0)
NEUTROS ABS: 2.5 10*3/uL (ref 1.4–7.7)
NEUTROS PCT: 58.4 % (ref 43.0–77.0)
PLATELETS: 219 10*3/uL (ref 150.0–400.0)
RBC: 4.27 Mil/uL (ref 3.87–5.11)
RDW: 12.9 % (ref 11.5–15.5)
WBC: 4.3 10*3/uL (ref 4.0–10.5)

## 2016-08-03 LAB — COMPREHENSIVE METABOLIC PANEL
ALT: 11 U/L (ref 0–35)
AST: 14 U/L (ref 0–37)
Albumin: 4.3 g/dL (ref 3.5–5.2)
Alkaline Phosphatase: 42 U/L (ref 39–117)
BUN: 24 mg/dL — AB (ref 6–23)
CHLORIDE: 108 meq/L (ref 96–112)
CO2: 28 meq/L (ref 19–32)
CREATININE: 0.76 mg/dL (ref 0.40–1.20)
Calcium: 9.4 mg/dL (ref 8.4–10.5)
GFR: 81.23 mL/min (ref >60.00)
Glucose, Bld: 89 mg/dL (ref 70–99)
Potassium: 5.2 mEq/L — ABNORMAL HIGH (ref 3.5–5.1)
SODIUM: 140 meq/L (ref 135–145)
Total Bilirubin: 0.5 mg/dL (ref 0.2–1.2)
Total Protein: 6.6 g/dL (ref 6.0–8.3)

## 2016-08-03 LAB — LIPID PANEL
CHOL/HDL RATIO: 2
Cholesterol: 168 mg/dL (ref 0–200)
HDL: 80.9 mg/dL (ref >39.00)
LDL CALC: 78 mg/dL (ref 0–99)
NonHDL: 87.04
TRIGLYCERIDES: 44 mg/dL (ref 0.0–149.0)
VLDL: 8.8 mg/dL (ref 0.0–40.0)

## 2016-08-03 LAB — TSH: TSH: 1.51 u[IU]/mL (ref 0.35–4.50)

## 2016-08-03 NOTE — Patient Instructions (Signed)
Great to see you. We will call you with your results and you can view them online.  Please schedule your mammogram.

## 2016-08-03 NOTE — Progress Notes (Signed)
Subjective:   Patient ID: Kathy Bishop, female    DOB: [birth year], 64 y.o.   MRN: [id number]  Kathy Bishop is a pleasant 65 y.o. year old female who presents to clinic today with Annual Exam (with pap)  on 08/03/2016  HPI:  Depression- Lexapro 30 mg daily is working well.  Denies any symptoms of depression or anxiety.  Has lost weight because she is making positive changes- exercising at least 5 days per week, cut out wine.  Premarin and prometrium working well.  Denies any post menopausal bleeding.  Last pap a smear ? 2010  Mammogram 08/04/12  Has never had a colonoscopy.  Denies changes in her bowel habits or blood in her stool.  Current Outpatient Prescriptions on File Prior to Visit  Medication Sig Dispense Refill  . escitalopram (LEXAPRO) 20 MG tablet TAKE 1 AND 1/2 TABLETS DAILY 45 tablet 2  . estrogens, conjugated, (PREMARIN) 0.3 MG tablet Take 1 tablet (0.3 mg total) by mouth daily. COMPLETE PHYSICAL EXAM NEEDED 30 tablet 2  . progesterone (PROMETRIUM) 100 MG capsule Take 1 capsule (100 mg total) by mouth at bedtime. OFFICE VISIT REQUIRED FOR ADDITIONAL REFILLS 30 capsule 2   No current facility-administered medications on file prior to visit.     No Known Allergies  Past Medical History:  Diagnosis Date  . Depression   . Dermatitis    allergic  . Foot pain, right   . Need for prophylactic hormone replacement therapy (postmenopausal)   . Nocturia   . Routine general medical examination at a health care facility   . Symptomatic menopausal or female climacteric states   . Thoracic or lumbosacral neuritis or radiculitis, unspecified     Past Surgical History:  Procedure Laterality Date  . TONSILLECTOMY     age 63    No family history on file.  Social History   Social History  . Marital status: Married    Spouse name: N/A  . Number of children: 0  . Years of education: N/A   Occupational History  .  [employer]    works in the Physicist, medical- support    Social History Main Topics  . Smoking status: Former Research scientist (life sciences)  . Smokeless tobacco: Never Used     Comment: smoked years ago  . Alcohol use 4.2 oz/week    7 Glasses of wine per week     Comment: occasional  . Drug use: No  . Sexual activity: Not on file   Other Topics Concern  . Not on file   Social History Narrative  . No narrative on file   The PMH, PSH, Social History, Family History, Medications, and allergies have been reviewed in [facility], and have been updated if relevant.    Review of Systems  Constitutional: Negative.   HENT: Negative.   Eyes: Negative.   Respiratory: Negative.   Cardiovascular: Negative.   Gastrointestinal: Negative.   Endocrine: Negative.   Genitourinary: Negative.   Musculoskeletal: Negative.   Skin: Negative.   Allergic/Immunologic: Negative.   Neurological: Negative.   Hematological: Negative.   Psychiatric/Behavioral: Negative.   All other systems reviewed and are negative.      Objective:    BP 114/82   Pulse 63   Temp 97.7 F (36.5 C) (Oral)   Ht 5' 3.5" (1.613 m)   Wt 117 lb 4 oz (53.2 kg)   SpO2 97%   BMI 20.44 kg/m    Physical Exam   General:  Well-developed,well-nourished,in no acute distress; alert,appropriate  and cooperative throughout examination Head:  normocephalic and atraumatic.   Eyes:  vision grossly intact, pupils equal, pupils round, and pupils reactive to light.   Ears:  R ear normal and L ear normal.   Nose:  no external deformity.   Mouth:  good dentition.   Neck:  No deformities, masses, or tenderness noted. Breasts:  No mass, nodules, thickening, tenderness, bulging, retraction, inflamation, nipple discharge or skin changes noted.   Lungs:  Normal respiratory effort, chest expands symmetrically. Lungs are clear to auscultation, no crackles or wheezes. Heart:  Normal rate and regular rhythm. S1 and S2 normal without gallop, murmur, click, rub or other extra sounds. Abdomen:  Bowel sounds  positive,abdomen soft and non-tender without masses, organomegaly or hernias noted. Rectal:  no external abnormalities.   Genitalia:  Pelvic Exam:        External: normal female genitalia without lesions or masses        Vagina: normal without lesions or masses        Cervix: normal without lesions or masses        Adnexa: normal bimanual exam without masses or fullness        Uterus: normal by palpation        Pap smear: performed Msk:  No deformity or scoliosis noted of thoracic or lumbar spine.   Extremities:  No clubbing, cyanosis, edema, or deformity noted with normal full range of motion of all joints.   Neurologic:  alert & oriented X3 and gait normal.   Skin:  Intact without suspicious lesions or rashes Cervical Nodes:  No lymphadenopathy noted Axillary Nodes:  No palpable lymphadenopathy Psych:  Cognition and judgment appear intact. Alert and cooperative with normal attention span and concentration. No apparent delusions, illusions, hallucinations       Assessment & Plan:   Well woman exam No Follow-up on file.

## 2016-08-03 NOTE — Assessment & Plan Note (Signed)
Well controlled. No changes made to rxs. 

## 2016-08-03 NOTE — Addendum Note (Signed)
Addended by: Ellamae Sia on: 08/03/2016 08:45 AM   Modules accepted: Orders

## 2016-08-03 NOTE — Addendum Note (Signed)
Addended by: Modena Nunnery on: 08/03/2016 07:59 AM   Modules accepted: Orders

## 2016-08-03 NOTE — Progress Notes (Signed)
Pre visit review using our clinic review tool, if applicable. No additional management support is needed unless otherwise documented below in the visit note. 

## 2016-08-03 NOTE — Assessment & Plan Note (Signed)
Reviewed preventive care protocols, scheduled due services, and updated immunizations Discussed nutrition, exercise, diet, and healthy lifestyle.  Pap smear done today.  Labs today.  Pt to call to schedule mammogram. She has never had a colonoscopy but agrees to IFOB.

## 2016-08-03 NOTE — Assessment & Plan Note (Signed)
Continue premarin with prometrium.

## 2016-08-04 ENCOUNTER — Other Ambulatory Visit: Payer: Self-pay | Admitting: Family Medicine

## 2016-08-04 ENCOUNTER — Encounter: Payer: Self-pay | Admitting: *Deleted

## 2016-08-04 DIAGNOSIS — Z1231 Encounter for screening mammogram for malignant neoplasm of breast: Secondary | ICD-10-CM

## 2016-08-04 LAB — CYTOLOGY - PAP
DIAGNOSIS: NEGATIVE
HPV: NOT DETECTED

## 2016-08-04 LAB — HEPATITIS C ANTIBODY: HCV Ab: NEGATIVE

## 2016-08-06 ENCOUNTER — Encounter: Payer: Self-pay | Admitting: *Deleted

## 2016-08-10 ENCOUNTER — Ambulatory Visit
Admission: RE | Admit: 2016-08-10 | Discharge: 2016-08-10 | Disposition: A | Discharge status: Home / Self Care | Payer: BC Managed Care – PPO | Source: Ambulatory Visit | Attending: Family Medicine | Admitting: Family Medicine

## 2016-08-10 DIAGNOSIS — Z1231 Encounter for screening mammogram for malignant neoplasm of breast: Secondary | ICD-10-CM

## 2016-08-12 ENCOUNTER — Other Ambulatory Visit: Payer: Self-pay | Admitting: Family Medicine

## 2016-08-12 DIAGNOSIS — R928 Other abnormal and inconclusive findings on diagnostic imaging of breast: Secondary | ICD-10-CM

## 2016-08-18 ENCOUNTER — Ambulatory Visit
Admission: RE | Admit: 2016-08-18 | Discharge: 2016-08-18 | Disposition: A | Discharge status: Home / Self Care | Payer: BC Managed Care – PPO | Source: Ambulatory Visit | Attending: Family Medicine | Admitting: Family Medicine

## 2016-08-18 ENCOUNTER — Other Ambulatory Visit: Payer: Self-pay | Admitting: Family Medicine

## 2016-08-18 DIAGNOSIS — R928 Other abnormal and inconclusive findings on diagnostic imaging of breast: Secondary | ICD-10-CM

## 2016-08-18 DIAGNOSIS — R229 Localized swelling, mass and lump, unspecified: Principal | ICD-10-CM

## 2016-08-18 DIAGNOSIS — IMO0002 Reserved for concepts with insufficient information to code with codable children: Secondary | ICD-10-CM

## 2016-08-19 ENCOUNTER — Other Ambulatory Visit: Payer: Self-pay | Admitting: Family Medicine

## 2016-08-19 DIAGNOSIS — R229 Localized swelling, mass and lump, unspecified: Principal | ICD-10-CM

## 2016-08-19 DIAGNOSIS — IMO0002 Reserved for concepts with insufficient information to code with codable children: Secondary | ICD-10-CM

## 2016-08-20 ENCOUNTER — Ambulatory Visit
Admission: RE | Admit: 2016-08-20 | Discharge: 2016-08-20 | Disposition: A | Discharge status: Home / Self Care | Payer: BC Managed Care – PPO | Source: Ambulatory Visit | Attending: Family Medicine | Admitting: Family Medicine

## 2016-08-20 ENCOUNTER — Other Ambulatory Visit: Payer: Self-pay | Admitting: Family Medicine

## 2016-08-20 DIAGNOSIS — R229 Localized swelling, mass and lump, unspecified: Principal | ICD-10-CM

## 2016-08-20 DIAGNOSIS — IMO0002 Reserved for concepts with insufficient information to code with codable children: Secondary | ICD-10-CM

## 2016-08-31 ENCOUNTER — Telehealth: Payer: Self-pay | Admitting: *Deleted

## 2016-08-31 ENCOUNTER — Encounter: Payer: Self-pay | Admitting: *Deleted

## 2016-08-31 DIAGNOSIS — C50412 Malignant neoplasm of upper-outer quadrant of left female breast: Secondary | ICD-10-CM | POA: Insufficient documentation

## 2016-08-31 DIAGNOSIS — Z170 Estrogen receptor positive status [ER+]: Secondary | ICD-10-CM | POA: Insufficient documentation

## 2016-08-31 HISTORY — DX: Estrogen receptor positive status (ER+): Z17.0

## 2016-08-31 HISTORY — DX: Malignant neoplasm of upper-outer quadrant of left female breast: C50.412

## 2016-08-31 NOTE — Telephone Encounter (Signed)
Scheduled and confirmed new pt appt with Dr. Jana Hakim on 09/10/16 at 1:30/2:00. Gave instructions and contact information. Denies further questions or needs.

## 2016-08-31 NOTE — Progress Notes (Addendum)
Location of Breast Cancer: Left Breast  Histology per Pathology Report:  08/20/16 Diagnosis 1. Breast, left, needle core biopsy, 2 o'clock palpable 8cmfn - INVASIVE DUCTAL CARCINOMA. - DUCTAL CARCINOMA IN SITU WITH NECROSIS. - SEE COMMENT  Receptor Status: ER (95%), PR (100%), Ki- (15%) Her2-neu (NEG)  2. Breast, left, needle core biopsy, 2 o'clock 5 cmfn - INVASIVE DUCTAL CARCINOMA.  Receptor Status: ER(95%), PR (100%), Her2-neu (NEG), Ki-(10%)  Did patient present with symptoms or was this found on screening mammography?: It was found on a screening mammogram. The patient noted however that she had felt a mass in her Left Breast for almost a year.   Past/Anticipated interventions by surgeon, if any: Dr. Blackman. Her surgery has not been scheduled at this point. She mentions that she may have an MRI also.   Past/Anticipated interventions by medical oncology, if any:  Dr. Magrinat 09/10/16  Lymphedema issues, if any:  No  Pain issues, if any:  No  SAFETY ISSUES:  Prior radiation? No  Pacemaker/ICD? No  Possible current pregnancy? N/A  Is the patient on methotrexate? No  Current Complaints / other details:   She discontinued her hormones (premarin and progesterone several weeks ago on her own)  BP (!) 152/85   Pulse 65   Temp 98.2 F (36.8 C)   Ht 5' 3.5" (1.613 m)   Wt 112 lb 12.8 oz (51.2 kg)   SpO2 100% Comment: room air  BMI 19.67 kg/m     Malmfelt, Jennifer L, RN 08/31/2016,2:26 PM   

## 2016-09-07 ENCOUNTER — Ambulatory Visit
Admission: RE | Admit: 2016-09-07 | Discharge: 2016-09-07 | Disposition: A | Discharge status: Home / Self Care | Payer: BC Managed Care – PPO | Source: Ambulatory Visit | Attending: Radiation Oncology | Admitting: Radiation Oncology

## 2016-09-07 ENCOUNTER — Encounter: Payer: Self-pay | Admitting: Radiation Oncology

## 2016-09-07 ENCOUNTER — Other Ambulatory Visit: Payer: Self-pay | Admitting: *Deleted

## 2016-09-07 VITALS — BP 152/85 | HR 65 | Temp 98.2°F | Ht 63.5 in | Wt 112.8 lb

## 2016-09-07 DIAGNOSIS — Z79899 Other long term (current) drug therapy: Secondary | ICD-10-CM | POA: Insufficient documentation

## 2016-09-07 DIAGNOSIS — C50412 Malignant neoplasm of upper-outer quadrant of left female breast: Secondary | ICD-10-CM

## 2016-09-07 DIAGNOSIS — Z170 Estrogen receptor positive status [ER+]: Principal | ICD-10-CM

## 2016-09-07 DIAGNOSIS — Z87891 Personal history of nicotine dependence: Secondary | ICD-10-CM | POA: Diagnosis not present

## 2016-09-07 NOTE — Progress Notes (Signed)
Radiation Oncology         [phone]) [phone] ________________________________  Initial outpatient Consultation  Name: Kathy Bishop MRN: [id number]  Date: 09/07/2016  DOB: [birth year]  ZY:SAYTK Kathy Medina, MD  Kathy Keens, MD   REFERRING PHYSICIAN: Coralie Keens, MD  DIAGNOSIS:    ICD-9-CM ICD-10-CM   1. Malignant neoplasm of upper-outer quadrant of left breast in female, estrogen receptor positive ([place]) 174.4 C50.412 Ambulatory referral to Social Work   V86.0 Z17.0    StageT2N0M0 Left Breast UOQ Invasive Ductal Carcinoma, ER+ / PR+ / Her2neg, Grade 2  CHIEF COMPLAINT: Here to discuss management of left breast cancer  HISTORY OF PRESENT ILLNESS::Kathy Bishop is a 65 y.o. female who presented with a palpable mass in her left mass for a year, followed by a tomo mammogram + [place], which revealed on 08-18-16 a 3.5 cm UOQ left breast mass.  [place] on 08-20-16 showed additional satellite lesions in the left breast UOQ; no axillary adenopathy.  Biopsy of two lesions in the left breast at 2:00 on 08-20-16 showed IDC with characteristics as described above in the diagnosis.  She has a MRI pending for further workup to determine type of surgery - mastectomy vs lumpectomy.  She has a pending appt with med/onc.  She discontinued hormonal supplements  She quit smoking in her 20s  Mild weight loss r/t anxiety.   She is in her USOH otherwise. 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  PREVIOUS RADIATION THERAPY: No  PAST MEDICAL HISTORY:  has a past medical history of Depression; Dermatitis; Foot pain, right; Malignant neoplasm of upper-outer quadrant of left breast in female, estrogen receptor positive ([place] [place]) (08/31/2016); Need for prophylactic hormone replacement therapy (postmenopausal); Nocturia; Routine general medical examination at a health care facility; Symptomatic menopausal or female climacteric states; and Thoracic or lumbosacral neuritis or radiculitis, unspecified.    PAST SURGICAL  HISTORY: Past Surgical History:  Procedure Laterality Date  . TONSILLECTOMY     age 70    FAMILY HISTORY: family history is not on file.  SOCIAL HISTORY:  reports that she has quit smoking. She has never used smokeless tobacco. She reports that she drinks alcohol. She reports that she does not use drugs.  ALLERGIES: Patient has no known allergies.  MEDICATIONS:  Current Outpatient Prescriptions  Medication Sig Dispense Refill  . escitalopram (LEXAPRO) 20 MG tablet TAKE 1 AND 1/2 TABLETS DAILY 45 tablet 2  . estrogens, conjugated, (PREMARIN) 0.3 MG tablet Take 1 tablet (0.3 mg total) by mouth daily. COMPLETE PHYSICAL EXAM NEEDED (Patient not taking: Reported on 09/07/2016) 30 tablet 2  . progesterone (PROMETRIUM) 100 MG capsule Take 1 capsule (100 mg total) by mouth at bedtime. OFFICE VISIT REQUIRED FOR ADDITIONAL REFILLS (Patient not taking: Reported on 09/07/2016) 30 capsule 2   No current facility-administered medications for this encounter.     REVIEW OF SYSTEMS: A 10+ POINT REVIEW OF SYSTEMS WAS OBTAINED including neurology, dermatology, psychiatry, cardiac, respiratory, lymph, extremities, GI, GU, Musculoskeletal, constitutional, breasts, reproductive.  All pertinent positives are noted in the HPI.  All others are negative.   PHYSICAL EXAM:  height is 5' 3.5" (1.613 m) and weight is 112 lb 12.8 oz (51.2 kg). Her temperature is 98.2 F (36.8 C). Her blood pressure is 152/85 (abnormal) and her pulse is 65. Her oxygen saturation is 100%.   General: Alert and oriented, in no acute distress HEENT: Head is normocephalic. Extraocular movements are intact. Oropharynx is clear. Neck: Neck is supple, no palpable cervical or supraclavicular lymphadenopathy.  Heart: Regular in rate and rhythm with no murmurs, rubs, or gallops. Chest: Clear to auscultation bilaterally, with no rhonchi, wheezes, or rales. Abdomen: Soft, nontender, nondistended, with no rigidity or guarding. Extremities: No  cyanosis or edema. Lymphatics: see Neck Exam Skin: No concerning lesions. Musculoskeletal: symmetric strength and muscle tone throughout. Neurologic: Cranial nerves II through XII are grossly intact. No obvious focalities. Speech is fluent. Coordination is intact. Psychiatric: Judgment and insight are intact. Affect is appropriate. Breasts: 4cm mass, UOQ, left breast . No other palpable masses appreciated in the breasts or axillae  .   ECOG = 0  0 - Asymptomatic (Fully active, able to carry on all predisease activities without restriction)  1 - Symptomatic but completely ambulatory (Restricted in physically strenuous activity but ambulatory and able to carry out work of a light or sedentary nature. For example, light housework, office work)  2 - Symptomatic, <50% in bed during the day (Ambulatory and capable of all self care but unable to carry out any work activities. Up and about more than 50% of waking hours)  3 - Symptomatic, >50% in bed, but not bedbound (Capable of only limited self-care, confined to bed or chair 50% or more of waking hours)  4 - Bedbound (Completely disabled. Cannot carry on any self-care. Totally confined to bed or chair)  5 - Death   Eustace Pen MM, Creech RH, Tormey DC, et al. [phone]). "Toxicity and response criteria of the [facility]". [place] Oncol. 5 (6): 649-55   LABORATORY DATA:  Lab Results  Component Value Date   WBC 4.3 08/03/2016   HGB 13.7 08/03/2016   HCT 40.7 08/03/2016   MCV 95.4 08/03/2016   PLT 219.0 08/03/2016   CMP     Component Value Date/Time   NA 140 08/03/2016 0822   K 5.2 (H) 08/03/2016 0822   CL 108 08/03/2016 0822   CO2 28 08/03/2016 0822   GLUCOSE 89 08/03/2016 0822   BUN 24 (H) 08/03/2016 0822   CREATININE 0.76 08/03/2016 0822   CALCIUM 9.4 08/03/2016 0822   PROT 6.6 08/03/2016 0822   ALBUMIN 4.3 08/03/2016 0822   AST 14 08/03/2016 0822   ALT 11 08/03/2016 0822   ALKPHOS 42 08/03/2016 0822    BILITOT 0.5 08/03/2016 0822   GFRNONAA >90 02/14/2013 0658   GFRAA >90 02/14/2013 0658         RADIOGRAPHY: Mm Digital Screening Bilateral  Result Date: 08/11/2016 CLINICAL DATA:  Screening. EXAM: DIGITAL SCREENING BILATERAL MAMMOGRAM WITH CAD COMPARISON:  Previous exam(s). ACR Breast Density Category c: The breast tissue is heterogeneously dense, which may obscure small masses. FINDINGS: In the left breast, a possible mass warrants further evaluation. In the right breast, no findings suspicious for malignancy. Images were processed with CAD. IMPRESSION: Further evaluation is suggested for possible mass in the left breast. RECOMMENDATION: Diagnostic mammogram and possibly ultrasound of the left breast. (Code:[id number]) The patient will be contacted regarding the findings, and additional imaging will be scheduled. BI-RADS CATEGORY  0: Incomplete. Need additional imaging evaluation and/or prior mammograms for comparison. Electronically Signed   By: Claudie Revering M.D.   On: 08/11/2016 15:07   US Breast Ltd Uni Left Inc Axilla  Result Date: 08/18/2016 CLINICAL DATA:  Patient was called back from screening mammogram for a left breast mass. EXAM: 2D DIGITAL DIAGNOSTIC LEFT MAMMOGRAM WITH CAD AND ADJUNCT TOMO ULTRASOUND LEFT BREAST COMPARISON:  Previous exam(s). ACR Breast Density Category c: The breast tissue is heterogeneously dense,  which may obscure small masses. FINDINGS: Additional imaging of the left breast was performed. There is a 2.9 cm spiculated mass in the posterior third of the upper-outer quadrant of the left breast. There are no malignant type microcalcifications. Mammographic images were processed with CAD. On physical exam, I palpate a discrete mass in the left breast at 1 o'clock 8 cm from the nipple. Targeted ultrasound is performed, showing an irregular hypoechoic mass in the left breast at 1 o'clock 8 cm from the nipple measuring 3.5 x 1.6 x 2.7 cm. Sonographic evaluation of the left axilla  does not show any enlarged adenopathy. IMPRESSION: Suspicious mass in the upper-outer quadrant of the left breast. RECOMMENDATION: Ultrasound-guided core biopsy of the left breast mass is recommended. The biopsy will be scheduled at the patient's convenience. I have discussed the findings and recommendations with the patient. Results were also provided in writing at the conclusion of the visit. If applicable, a reminder letter will be sent to the patient regarding the next appointment. BI-RADS CATEGORY  5: Highly suggestive of malignancy. Electronically Signed   By: Lillia Mountain M.D.   On: 08/18/2016 10:22   Mm Diag Breast Tomo Uni Left  Result Date: 08/18/2016 CLINICAL DATA:  Patient was called back from screening mammogram for a left breast mass. EXAM: 2D DIGITAL DIAGNOSTIC LEFT MAMMOGRAM WITH CAD AND ADJUNCT TOMO ULTRASOUND LEFT BREAST COMPARISON:  Previous exam(s). ACR Breast Density Category c: The breast tissue is heterogeneously dense, which may obscure small masses. FINDINGS: Additional imaging of the left breast was performed. There is a 2.9 cm spiculated mass in the posterior third of the upper-outer quadrant of the left breast. There are no malignant type microcalcifications. Mammographic images were processed with CAD. On physical exam, I palpate a discrete mass in the left breast at 1 o'clock 8 cm from the nipple. Targeted ultrasound is performed, showing an irregular hypoechoic mass in the left breast at 1 o'clock 8 cm from the nipple measuring 3.5 x 1.6 x 2.7 cm. Sonographic evaluation of the left axilla does not show any enlarged adenopathy. IMPRESSION: Suspicious mass in the upper-outer quadrant of the left breast. RECOMMENDATION: Ultrasound-guided core biopsy of the left breast mass is recommended. The biopsy will be scheduled at the patient's convenience. I have discussed the findings and recommendations with the patient. Results were also provided in writing at the conclusion of the visit. If  applicable, a reminder letter will be sent to the patient regarding the next appointment. BI-RADS CATEGORY  5: Highly suggestive of malignancy. Electronically Signed   By: Lillia Mountain M.D.   On: 08/18/2016 10:22   Mm Clip Placement Left  Result Date: 08/20/2016 CLINICAL DATA:  Two ultrasound-guided biopsies were performed of the left breast today, including a 3.5 cm palpable mass at 8-10 cm from the nipple in the 2 o'clock position, and 1 of several satellite nodules in the 2 o'clock position of the left breast, approximately 5 cm from the nipple. EXAM: DIAGNOSTIC LEFT MAMMOGRAM POST ULTRASOUND BIOPSY COMPARISON:  Previous exam(s). FINDINGS: Mammographic images were obtained following ultrasound guided biopsy of a 3.5 cm palpable mass 2 o'clock position left breast and a possible satellite nodule 2 o'clock position left breast, 5 cm from the nipple. A ribbon shaped biopsy clip is satisfactorily positioned within the mammographically visible palpable mass in the deep upper outer quadrant of the left breast. A coil shaped biopsy clip is positioned in the expected location of the biopsied satellite nodule at 2 o'clock position 5  cm from the nipple. The 2 biopsy clips are separated by 5 cm. IMPRESSION: Satisfactory position of ribbon and coil shaped biopsy clips the left breast as described above. Final Assessment: Post Procedure Mammograms for Marker Placement Electronically Signed   By: Curlene Dolphin M.D.   On: 08/20/2016 09:27   [place] Lt Breast Bx W Loc Dev 1st Lesion Img Bx Spec US Guide  Addendum Date: 08/24/2016   ADDENDUM REPORT: 08/23/2016 14:28 ADDENDUM: Pathology revealed GRADE II INVASIVE DUCTAL CARCINOMA, DUCTAL CARCINOMA IN SITU WITH NECROSIS of the Left breast at the 2:00 o'clock location, 8 CMFN. GRADE II INVASIVE DUCTAL CARCINOMA of the Left breast at the 2:00 o'clock location, 5 CMFN. This was found to be concordant by Dr. Curlene Dolphin. Pathology results were discussed with the patient by  telephone. The patient reported doing well after the biopsies with tenderness at the sites. Post biopsy instructions and care were reviewed and questions were answered. The patient was encouraged to call [facility] for any additional concerns. Surgical consultation has been arranged with Dr. Nedra Hai at [facility] on August 27, 2016. Recommendation for MRI due to multiple satellite nodules and heterogeneously dense breasts. Pathology results reported by Terie Purser, RN on 08/23/2016. Electronically Signed   By: Curlene Dolphin M.D.   On: 08/23/2016 14:28   Result Date: 08/24/2016 CLINICAL DATA:  65 year old patient presents for ultrasound-guided biopsy of a suspicious palpable mass in the upper-outer quadrant of the left breast. M preparing for the ultrasound biopsy today, in addition to the previously described 3.5 cm palpable mass I visualize multiple solid sub-cm probable satellite nodules in the upper-outer quadrant of the left breast closer to the nipple than the larger palpable mass. One of these was biopsied today, and is dictated in a separate biopsy report. EXAM: ULTRASOUND GUIDED LEFT BREAST CORE NEEDLE BIOPSY COMPARISON:  Previous exam(s). FINDINGS: I met with the patient and we discussed the procedure of ultrasound-guided biopsy, including benefits and alternatives. We discussed the high likelihood of a successful procedure. We discussed the risks of the procedure, including infection, bleeding, tissue injury, clip migration, and inadequate sampling. Informed written consent was given. The usual time-out protocol was performed immediately prior to the procedure. Using sterile technique and 1% Lidocaine as local anesthetic, under direct ultrasound visualization, a 12 gauge spring-loaded device was used to perform biopsy of a palpable 3.5 cm mass in the 2 o'clock position of the left breast with the patient's arm raised, approximately 8-10 cm from the  nipple using a lateral to medial approach. At the conclusion of the procedure a ribbon shaped tissue marker clip was deployed into the biopsy cavity. Follow up 2 view mammogram was performed and dictated separately. IMPRESSION: Ultrasound guided biopsy of palpable left breast mass 2 o'clock position, 8-10 cm from the nipple. No apparent complications. Electronically Signed: By: Curlene Dolphin M.D. On: 08/20/2016 09:21   [place] Lt Breast Bx W Loc Dev Ea Add Lesion Img Bx Spec US Guide  Addendum Date: 08/24/2016   ADDENDUM REPORT: 08/23/2016 14:27 ADDENDUM: Pathology revealed GRADE II INVASIVE DUCTAL CARCINOMA, DUCTAL CARCINOMA IN SITU WITH NECROSIS of the Left breast at the 2:00 o'clock location, 8 CMFN. GRADE II INVASIVE DUCTAL CARCINOMA of the Left breast at the 2:00 o'clock location, 5 CMFN. This was found to be concordant by Dr. Curlene Dolphin. Pathology results were discussed with the patient by telephone. The patient reported doing well after the biopsies with tenderness at the sites. Post  biopsy instructions and care were reviewed and questions were answered. The patient was encouraged to call [facility] for any additional concerns. Surgical consultation has been arranged with Dr. Nedra Hai at [facility] on August 27, 2016. Recommendation for MRI due to multiple satellite nodules and heterogeneously dense breasts. Pathology results reported by Terie Purser, RN on 08/23/2016. Electronically Signed   By: Curlene Dolphin M.D.   On: 08/23/2016 14:27   Result Date: 08/24/2016 CLINICAL DATA:  The patient presented for ultrasound-guided biopsy of a 3.5 cm palpable mass in the upper-outer quadrant of the left breast today. While preparing for the biopsy today, I visualize several hypoechoic oval sub-cm nodules in the 2 o'clock region of the left breast, closer to the nipple than the larger palpable mass. At least 3 satellite nodules are visualized. The furthest from the  dominant mass is at the 2 o'clock position approximately 5 cm from the nipple, measures 5 mm in size, and is chosen for biopsy, as described below. EXAM: ULTRASOUND GUIDED LEFT BREAST CORE NEEDLE BIOPSY COMPARISON:  Previous exam(s). FINDINGS: I met with the patient and we discussed the procedure of ultrasound-guided biopsy, including benefits and alternatives. We discussed the high likelihood of a successful procedure. We discussed the risks of the procedure, including infection, bleeding, tissue injury, clip migration, and inadequate sampling. Informed written consent was given. The usual time-out protocol was performed immediately prior to the procedure. Using sterile technique and 1% Lidocaine as local anesthetic, under direct ultrasound visualization, a 12 gauge spring-loaded device was used to perform biopsy of approximately 5 cm possible satellite nodule at 2 o'clock position 5 cm from the nipple using a lateral to medial approach. At the conclusion of the procedure a coil shaped tissue marker clip was deployed into the biopsy cavity. Follow up 2 view mammogram was performed and dictated separately. IMPRESSION: Several satellite nodule suspected in the upper-outer quadrant of the left breast. Ultrasound guided biopsy of the 5 mm nodule 2 o'clock position approximately 5 cm from the nipple. No apparent complications. Electronically Signed: By: Curlene Dolphin M.D. On: 08/20/2016 09:25      IMPRESSION/PLAN: Stage IIA left breast cancer. Multifocal, ER+  It was a pleasure meeting the patient today.   We discussed the risks, benefits, and side effects of radiotherapy. We dicussed the fact that more information is needed (ie MRI, final path) before it is known if RT is indicated.  If she has a lumpectomy, I would  recommend radiotherapy to the left to reduce her risk of locoregional recurrence by 2/3.  If she has mastectomy, RT might be indicated, ie if nodal positivity is determined, or if there is a very  large tumor >>5cm or positive margins.  We discussed that radiation would take approximately 6 weeks to complete and that I would give the patient a few weeks to heal following surgery before starting treatment planning.  If chemotherapy were to be given, this would precede radiotherapy. We spoke about acute effects including skin irritation and fatigue as well as much less common late effects including internal organ injury or irritation. We spoke about the latest technology that is used to minimize the risk of late effects for patients undergoing radiotherapy to the breast or chest wall. No guarantees of treatment were given. The patient is enthusiastic about proceeding with treatment. I look forward to participating in the patient's care. Consent signed today.  I will await her referral back to me for postoperative follow-up  and eventual CT simulation/treatment planning if radiotherapy is warranted.   She is pleased with this plan and knows to call with any future questions.   __________________________________________   Eppie Gibson, MD

## 2016-09-10 ENCOUNTER — Ambulatory Visit (HOSPITAL_BASED_OUTPATIENT_CLINIC_OR_DEPARTMENT_OTHER): Payer: BC Managed Care – PPO | Admitting: Oncology

## 2016-09-10 ENCOUNTER — Other Ambulatory Visit (HOSPITAL_BASED_OUTPATIENT_CLINIC_OR_DEPARTMENT_OTHER): Payer: BC Managed Care – PPO

## 2016-09-10 ENCOUNTER — Encounter: Payer: Self-pay | Admitting: *Deleted

## 2016-09-10 VITALS — BP 149/89 | HR 66 | Temp 98.3°F | Resp 18 | Ht 63.5 in | Wt 115.4 lb

## 2016-09-10 DIAGNOSIS — Z170 Estrogen receptor positive status [ER+]: Principal | ICD-10-CM

## 2016-09-10 DIAGNOSIS — C50412 Malignant neoplasm of upper-outer quadrant of left female breast: Secondary | ICD-10-CM

## 2016-09-10 LAB — COMPREHENSIVE METABOLIC PANEL
ALBUMIN: 3.9 g/dL (ref 3.5–5.0)
ALK PHOS: 51 U/L (ref 40–150)
ALT: 16 U/L (ref 0–55)
AST: 16 U/L (ref 5–34)
Anion Gap: 8 mEq/L (ref 3–11)
BILIRUBIN TOTAL: 0.51 mg/dL (ref 0.20–1.20)
BUN: 21.8 mg/dL (ref 7.0–26.0)
CALCIUM: 9.2 mg/dL (ref 8.4–10.4)
CO2: 26 mEq/L (ref 22–29)
Chloride: 105 mEq/L (ref 98–109)
Creatinine: 0.8 mg/dL (ref 0.6–1.1)
EGFR: 82 mL/min/{1.73_m2} — AB (ref >90)
Glucose: 124 mg/dl (ref 70–140)
POTASSIUM: 4 meq/L (ref 3.5–5.1)
Sodium: 139 mEq/L (ref 136–145)
TOTAL PROTEIN: 6.8 g/dL (ref 6.4–8.3)

## 2016-09-10 LAB — CBC WITH DIFFERENTIAL/PLATELET
BASO%: 0.4 % (ref 0.0–2.0)
BASOS ABS: 0 10*3/uL (ref 0.0–0.1)
EOS ABS: 0.1 10*3/uL (ref 0.0–0.5)
EOS%: 1.2 % (ref 0.0–7.0)
HEMATOCRIT: 41.9 % (ref 34.8–46.6)
HEMOGLOBIN: 13.8 g/dL (ref 11.6–15.9)
LYMPH#: 1.9 10*3/uL (ref 0.9–3.3)
LYMPH%: 33.1 % (ref 14.0–49.7)
MCH: 32.2 pg (ref 25.1–34.0)
MCHC: 33 g/dL (ref 31.5–36.0)
MCV: 97.4 fL (ref 79.5–101.0)
MONO#: 0.3 10*3/uL (ref 0.1–0.9)
MONO%: 5.7 % (ref 0.0–14.0)
NEUT#: 3.5 10*3/uL (ref 1.5–6.5)
NEUT%: 59.6 % (ref 38.4–76.8)
PLATELETS: 210 10*3/uL (ref 145–400)
RBC: 4.3 10*6/uL (ref 3.70–5.45)
RDW: 12.6 % (ref 11.2–14.5)
WBC: 5.8 10*3/uL (ref 3.9–10.3)

## 2016-09-10 MED ORDER — ANASTROZOLE 1 MG PO TABS: 1.0000 mg | ORAL_TABLET | Freq: Every day | ORAL | 4 refills | Status: DC

## 2016-09-10 NOTE — Progress Notes (Signed)
[facility]  Telephone:([phone]) [phone] Fax:(336) [phone]     ID: [name] DOB: [birth year]  MR#: [id number]  GYK#:[id number]  Patient Care Team: Lucille Passy, MD as PCP - General Coralie Keens, MD as Consulting Physician (General Surgery) Chauncey Cruel, MD as Consulting Physician (Oncology) Eppie Gibson, MD as Attending Physician (Radiation Oncology) Chauncey Cruel, MD OTHER MD:  CHIEF COMPLAINT: Estrogen receptor positive breast cancer  CURRENT TREATMENT: Awaiting definitive surgery   BREAST CANCER HISTORY: Ms Poirier (pronounced "le-veen") reports noticing a mass in her left breast for about a year but did not think to bring this to medical attention, thinking it might be a cyst. The mass was picked up on routine physical exam by Dr. Deborra Medina who set up the patient for bilateral screening mammography at [facility] 08/10/2016. This showed the breast density to be category C. A possible mass was noted in the left breast and on 08/18/2016 she was recalled for left diagnostic mammography with tomography and left breast ultrasonography. This confirmed a 2.9 cm spiculated mass in the upper outer quadrant of the left breast. This was palpable at the 1:00 position 8 cm from the nipple. Ultrasonography found an irregular hypoechoic mass at this site measuring 3.5 cm. The left axilla was sonographically benign.  The patient was then set up for ultrasound-guided biopsy of the left breast mass 08/20/2016. At the time of this ultrasound multiple solid subcentimeter satellite nodules were noted in the upper-outer quadrant of the left breast and one of these was biopsied as well as the main mass. The distance between the clips is 5.0 cm. 4 The pathology from that procedure ([id number]) both showed invasive ductal carcinoma, grade 2, with similar prognostic panels. Specifically the estrogen receptor was 95% and the progesterone receptor 100% for both biopsies, both with strong  staining intensity. The MIB-1 was between 10 and 15%. Both tumors were HER-2 negative, with a signals ratio between 0.78 and 1.06 and the number per cell between 1.45 and 1.80  The patient's subsequent history is as detailed below thank you  INTERVAL HISTORY: Portia was evaluated in the breast clinic12/10/2015. Her case was also presented in the multidisciplinary breast cancer conference 09/01/2016. At that time a preliminary plan was proposed: MRI, then breast conserving surgery or mastectomy, with an Oncotype sent from the final pathology, radiation and anti-estrogens.  REVIEW OF SYSTEMS: Aside from the palpable mass, there were no specific symptoms leading to the original mammogram, which was routinely scheduled. The patient denies unusual headaches, visual changes, nausea, vomiting, stiff neck, dizziness, or gait imbalance. There has been no cough, phlegm production, or pleurisy, no chest pain or pressure, and no change in bowel or bladder habits. The patient denies fever, rash, bleeding, unexplained fatigue or unexplained weight loss. A detailed review of systems was otherwise entirely negative.   PAST MEDICAL HISTORY: Past Medical History:  Diagnosis Date  . Depression   . Dermatitis    allergic  . Foot pain, right   . Malignant neoplasm of upper-outer quadrant of left breast in female, estrogen receptor positive ([place]) 08/31/2016  . Need for prophylactic hormone replacement therapy (postmenopausal)   . Nocturia   . Routine general medical examination at a health care facility   . Symptomatic menopausal or female climacteric states   . Thoracic or lumbosacral neuritis or radiculitis, unspecified     PAST SURGICAL HISTORY: Past Surgical History:  Procedure Laterality Date  . TONSILLECTOMY     age 65  FAMILY HISTORY No family history on file.  The patient's father died from lung cancer in the setting of tobacco. Patient's mother died at age 4 with Alzheimer's is the patient has  no brothers, 2 sisters. There is no history of breast or ovarian cancer in the family   GYNECOLOGIC HISTORY:  No LMP recorded. Patient is postmenopausal. Menarche age 39, the patient is GX P0. She stopped having periods approximately 2013. She has been on oral contraceptives more than 5 years, stopping in mid-November 2017.  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 Her husband Clair Gulling is self-employed in [employer] and runs a grown [employer]. At home is just the 2 of them plus a dog and 3 cats.    ADVANCED DIRECTIVES: Not in place   HEALTH MAINTENANCE: Social History  Substance Use Topics  . Smoking status: Former Research scientist (life sciences)  . Smokeless tobacco: Never Used     Comment: smoked years ago in her 20's socially  . Alcohol use Yes     Comment: occasional, she has a history of drinking about 7 glasses of wine weekly     Colonoscopy:Remote  PAP:  Bone density: Never   No Known Allergies  Current Outpatient Prescriptions  Medication Sig Dispense Refill  . escitalopram (LEXAPRO) 20 MG tablet TAKE 1 AND 1/2 TABLETS DAILY 45 tablet 2   No current facility-administered medications for this visit.     OBJECTIVE: Relates white woman who appears well  Vitals:   09/10/16 1403  BP: (!) 149/89  Pulse: 66  Resp: 18  Temp: 98.3 F (36.8 C)     Body mass index is 20.12 kg/m.    ECOG FS:0 - Asymptomatic Filed Weights   09/10/16 1403  Weight: 115 lb 6.4 oz (52.3 kg)   Sclerae unicteric, pupils round and equal Oropharynx clear and moist-- no thrush or other lesions No cervical or supraclavicular adenopathy Lungs no rales or rhonchi Heart regular rate and rhythm Abd soft, nontender, positive bowel sounds MSK no focal spinal tenderness, no upper extremity lymphedema Neuro: nonfocal, well oriented, appropriate affect Breasts: The right breast is unremarkable. In the left breast upper outer quadrant there is a  palpable firm movable mass measuring approximately 2 cm by palpation, with no associated erythema, no dimpling, and no nipple retraction. The left axilla is benign  LAB RESULTS:  CMP     Component Value Date/Time   NA 140 08/03/2016 0822   K 5.2 (H) 08/03/2016 0822   CL 108 08/03/2016 0822   CO2 28 08/03/2016 0822   GLUCOSE 89 08/03/2016 0822   BUN 24 (H) 08/03/2016 0822   CREATININE 0.76 08/03/2016 0822   CALCIUM 9.4 08/03/2016 0822   PROT 6.6 08/03/2016 0822   ALBUMIN 4.3 08/03/2016 0822   AST 14 08/03/2016 0822   ALT 11 08/03/2016 0822   ALKPHOS 42 08/03/2016 0822   BILITOT 0.5 08/03/2016 0822   GFRNONAA >90 02/14/2013 0658   GFRAA >90 02/14/2013 0658    INo results found for: SPEP, UPEP  Lab Results  Component Value Date   WBC 5.8 09/10/2016   NEUTROABS 3.5 09/10/2016   HGB 13.8 09/10/2016   HCT 41.9 09/10/2016   MCV 97.4 09/10/2016   PLT 210 09/10/2016      Chemistry      Component Value Date/Time   NA 140 08/03/2016 0822   K 5.2 (H) 08/03/2016 0822   CL 108 08/03/2016 8938  CO2 28 08/03/2016 0822   BUN 24 (H) 08/03/2016 0822   CREATININE 0.76 08/03/2016 0822      Component Value Date/Time   CALCIUM 9.4 08/03/2016 0822   ALKPHOS 42 08/03/2016 0822   AST 14 08/03/2016 0822   ALT 11 08/03/2016 0822   BILITOT 0.5 08/03/2016 0822       No results found for: LABCA2  No components found for: [id number]  No results for input(s): INR in the last 168 hours.  Urinalysis    Component Value Date/Time   COLORURINE YELLOW 02/14/2013 0703   APPEARANCEUR CLOUDY (A) 02/14/2013 0703   LABSPEC 1.018 02/14/2013 0703   PHURINE 8.0 02/14/2013 0703   GLUCOSEU NEGATIVE 02/14/2013 0703   HGBUR NEGATIVE 02/14/2013 0703   BILIRUBINUR NEGATIVE 02/14/2013 0703   KETONESUR 15 (A) 02/14/2013 0703   PROTEINUR NEGATIVE 02/14/2013 0703   UROBILINOGEN 0.2 02/14/2013 0703   NITRITE NEGATIVE 02/14/2013 0703   LEUKOCYTESUR NEGATIVE 02/14/2013 0703     STUDIES: US  Breast Ltd Uni Left Inc Axilla  Result Date: 08/18/2016 CLINICAL DATA:  Patient was called back from screening mammogram for a left breast mass. EXAM: 2D DIGITAL DIAGNOSTIC LEFT MAMMOGRAM WITH CAD AND ADJUNCT TOMO ULTRASOUND LEFT BREAST COMPARISON:  Previous exam(s). ACR Breast Density Category c: The breast tissue is heterogeneously dense, which may obscure small masses. FINDINGS: Additional imaging of the left breast was performed. There is a 2.9 cm spiculated mass in the posterior third of the upper-outer quadrant of the left breast. There are no malignant type microcalcifications. Mammographic images were processed with CAD. On physical exam, I palpate a discrete mass in the left breast at 1 o'clock 8 cm from the nipple. Targeted ultrasound is performed, showing an irregular hypoechoic mass in the left breast at 1 o'clock 8 cm from the nipple measuring 3.5 x 1.6 x 2.7 cm. Sonographic evaluation of the left axilla does not show any enlarged adenopathy. IMPRESSION: Suspicious mass in the upper-outer quadrant of the left breast. RECOMMENDATION: Ultrasound-guided core biopsy of the left breast mass is recommended. The biopsy will be scheduled at the patient's convenience. I have discussed the findings and recommendations with the patient. Results were also provided in writing at the conclusion of the visit. If applicable, a reminder letter will be sent to the patient regarding the next appointment. BI-RADS CATEGORY  5: Highly suggestive of malignancy. Electronically Signed   By: Lillia Mountain M.D.   On: 08/18/2016 10:22   Mm Diag Breast Tomo Uni Left  Result Date: 08/18/2016 CLINICAL DATA:  Patient was called back from screening mammogram for a left breast mass. EXAM: 2D DIGITAL DIAGNOSTIC LEFT MAMMOGRAM WITH CAD AND ADJUNCT TOMO ULTRASOUND LEFT BREAST COMPARISON:  Previous exam(s). ACR Breast Density Category c: The breast tissue is heterogeneously dense, which may obscure small masses. FINDINGS: Additional  imaging of the left breast was performed. There is a 2.9 cm spiculated mass in the posterior third of the upper-outer quadrant of the left breast. There are no malignant type microcalcifications. Mammographic images were processed with CAD. On physical exam, I palpate a discrete mass in the left breast at 1 o'clock 8 cm from the nipple. Targeted ultrasound is performed, showing an irregular hypoechoic mass in the left breast at 1 o'clock 8 cm from the nipple measuring 3.5 x 1.6 x 2.7 cm. Sonographic evaluation of the left axilla does not show any enlarged adenopathy. IMPRESSION: Suspicious mass in the upper-outer quadrant of the left breast. RECOMMENDATION: Ultrasound-guided core biopsy of the  left breast mass is recommended. The biopsy will be scheduled at the patient's convenience. I have discussed the findings and recommendations with the patient. Results were also provided in writing at the conclusion of the visit. If applicable, a reminder letter will be sent to the patient regarding the next appointment. BI-RADS CATEGORY  5: Highly suggestive of malignancy. Electronically Signed   By: Lillia Mountain M.D.   On: 08/18/2016 10:22   Mm Clip Placement Left  Result Date: 08/20/2016 CLINICAL DATA:  Two ultrasound-guided biopsies were performed of the left breast today, including a 3.5 cm palpable mass at 8-10 cm from the nipple in the 2 o'clock position, and 1 of several satellite nodules in the 2 o'clock position of the left breast, approximately 5 cm from the nipple. EXAM: DIAGNOSTIC LEFT MAMMOGRAM POST ULTRASOUND BIOPSY COMPARISON:  Previous exam(s). FINDINGS: Mammographic images were obtained following ultrasound guided biopsy of a 3.5 cm palpable mass 2 o'clock position left breast and a possible satellite nodule 2 o'clock position left breast, 5 cm from the nipple. A ribbon shaped biopsy clip is satisfactorily positioned within the mammographically visible palpable mass in the deep upper outer quadrant of the  left breast. A coil shaped biopsy clip is positioned in the expected location of the biopsied satellite nodule at 2 o'clock position 5 cm from the nipple. The 2 biopsy clips are separated by 5 cm. IMPRESSION: Satisfactory position of ribbon and coil shaped biopsy clips the left breast as described above. Final Assessment: Post Procedure Mammograms for Marker Placement Electronically Signed   By: Curlene Dolphin M.D.   On: 08/20/2016 09:27   [place] Lt Breast Bx W Loc Dev 1st Lesion Img Bx Spec US Guide  Addendum Date: 08/24/2016   ADDENDUM REPORT: 08/23/2016 14:28 ADDENDUM: Pathology revealed GRADE II INVASIVE DUCTAL CARCINOMA, DUCTAL CARCINOMA IN SITU WITH NECROSIS of the Left breast at the 2:00 o'clock location, 8 CMFN. GRADE II INVASIVE DUCTAL CARCINOMA of the Left breast at the 2:00 o'clock location, 5 CMFN. This was found to be concordant by Dr. Curlene Dolphin. Pathology results were discussed with the patient by telephone. The patient reported doing well after the biopsies with tenderness at the sites. Post biopsy instructions and care were reviewed and questions were answered. The patient was encouraged to call [facility] for any additional concerns. Surgical consultation has been arranged with Dr. Nedra Hai at [facility] on August 27, 2016. Recommendation for MRI due to multiple satellite nodules and heterogeneously dense breasts. Pathology results reported by Terie Purser, RN on 08/23/2016. Electronically Signed   By: Curlene Dolphin M.D.   On: 08/23/2016 14:28   Result Date: 08/24/2016 CLINICAL DATA:  65 year old patient presents for ultrasound-guided biopsy of a suspicious palpable mass in the upper-outer quadrant of the left breast. M preparing for the ultrasound biopsy today, in addition to the previously described 3.5 cm palpable mass I visualize multiple solid sub-cm probable satellite nodules in the upper-outer quadrant of the left breast closer to the  nipple than the larger palpable mass. One of these was biopsied today, and is dictated in a separate biopsy report. EXAM: ULTRASOUND GUIDED LEFT BREAST CORE NEEDLE BIOPSY COMPARISON:  Previous exam(s). FINDINGS: I met with the patient and we discussed the procedure of ultrasound-guided biopsy, including benefits and alternatives. We discussed the high likelihood of a successful procedure. We discussed the risks of the procedure, including infection, bleeding, tissue injury, clip migration, and inadequate sampling. Informed written consent was given.  The usual time-out protocol was performed immediately prior to the procedure. Using sterile technique and 1% Lidocaine as local anesthetic, under direct ultrasound visualization, a 12 gauge spring-loaded device was used to perform biopsy of a palpable 3.5 cm mass in the 2 o'clock position of the left breast with the patient's arm raised, approximately 8-10 cm from the nipple using a lateral to medial approach. At the conclusion of the procedure a ribbon shaped tissue marker clip was deployed into the biopsy cavity. Follow up 2 view mammogram was performed and dictated separately. IMPRESSION: Ultrasound guided biopsy of palpable left breast mass 2 o'clock position, 8-10 cm from the nipple. No apparent complications. Electronically Signed: By: Curlene Dolphin M.D. On: 08/20/2016 09:21   [place] Lt Breast Bx W Loc Dev Ea Add Lesion Img Bx Spec US Guide  Addendum Date: 08/24/2016   ADDENDUM REPORT: 08/23/2016 14:27 ADDENDUM: Pathology revealed GRADE II INVASIVE DUCTAL CARCINOMA, DUCTAL CARCINOMA IN SITU WITH NECROSIS of the Left breast at the 2:00 o'clock location, 8 CMFN. GRADE II INVASIVE DUCTAL CARCINOMA of the Left breast at the 2:00 o'clock location, 5 CMFN. This was found to be concordant by Dr. Curlene Dolphin. Pathology results were discussed with the patient by telephone. The patient reported doing well after the biopsies with tenderness at the sites. Post biopsy  instructions and care were reviewed and questions were answered. The patient was encouraged to call [facility] for any additional concerns. Surgical consultation has been arranged with Dr. Nedra Hai at [facility] on August 27, 2016. Recommendation for MRI due to multiple satellite nodules and heterogeneously dense breasts. Pathology results reported by Terie Purser, RN on 08/23/2016. Electronically Signed   By: Curlene Dolphin M.D.   On: 08/23/2016 14:27   Result Date: 08/24/2016 CLINICAL DATA:  The patient presented for ultrasound-guided biopsy of a 3.5 cm palpable mass in the upper-outer quadrant of the left breast today. While preparing for the biopsy today, I visualize several hypoechoic oval sub-cm nodules in the 2 o'clock region of the left breast, closer to the nipple than the larger palpable mass. At least 3 satellite nodules are visualized. The furthest from the dominant mass is at the 2 o'clock position approximately 5 cm from the nipple, measures 5 mm in size, and is chosen for biopsy, as described below. EXAM: ULTRASOUND GUIDED LEFT BREAST CORE NEEDLE BIOPSY COMPARISON:  Previous exam(s). FINDINGS: I met with the patient and we discussed the procedure of ultrasound-guided biopsy, including benefits and alternatives. We discussed the high likelihood of a successful procedure. We discussed the risks of the procedure, including infection, bleeding, tissue injury, clip migration, and inadequate sampling. Informed written consent was given. The usual time-out protocol was performed immediately prior to the procedure. Using sterile technique and 1% Lidocaine as local anesthetic, under direct ultrasound visualization, a 12 gauge spring-loaded device was used to perform biopsy of approximately 5 cm possible satellite nodule at 2 o'clock position 5 cm from the nipple using a lateral to medial approach. At the conclusion of the procedure a coil shaped tissue marker  clip was deployed into the biopsy cavity. Follow up 2 view mammogram was performed and dictated separately. IMPRESSION: Several satellite nodule suspected in the upper-outer quadrant of the left breast. Ultrasound guided biopsy of the 5 mm nodule 2 o'clock position approximately 5 cm from the nipple. No apparent complications. Electronically Signed: By: Curlene Dolphin M.D. On: 08/20/2016 09:25    ELIGIBLE FOR AVAILABLE RESEARCH PROTOCOL: no  ASSESSMENT: 65 y.o. [place] woman status post left breast upper outer quadrant biopsy x2 on 08/20/2016 for a clinical mT2 N0, stage IIA invasive ductal carcinoma, both tumors showing identical prognostic panels: Estrogen and progesterone receptor strongly positive, HER-2 negative, with an MIB-1 of 10-15%   (1) neoadjuvant anastrozole started 09/10/2016 in anticipation possible surgical delays  (2) definitive surgery to follow (with reconstruction if mastectomy)  (3) Oncotype DX to be obtained from the final surgical sample  (4) adjuvant radiation to follow as appropriate  (5) continue on anti-estrogens a minimum of 5 years  PLAN: We spent the better part of today's hour-long appointment discussing the biology of breast cancer in general, and the specifics of the patient's tumor in particular. We first reviewed the fact that cancer is not one disease but more than 100 different diseases and that it is important to keep them separate-- otherwise when friends and relatives discuss their own cancer experiences with [place] confusion can result. Similarly we explained that if breast cancer spreads to the bone or liver, the patient would not have bone cancer or liver cancer, but breast cancer in the bone and breast cancer in the liver: one cancer in three places-- not 3 different cancers which otherwise would have to be treated in 3 different ways.  We discussed the difference between local and systemic therapy. In terms of loco-regional treatment, lumpectomy plus  radiation is equivalent to mastectomy as far as survival is concerned. For this reason, and because the cosmetic results are generally superior, we recommend breast conserving surgery. We also noted that in terms of sequencing of treatments, whether systemic therapy or surgery is done first does not affect the ultimate outcome. This is relevant to Caelynn's case as noted below   We then discussed the rationale for systemic therapy. There is some risk that this cancer may have already spread to other parts of her body. Patients frequently ask at this point about bone scans, CAT scans and PET scans to find out if they have occult breast cancer somewhere else. The problem is that in early stage disease we are much more likely to find false positives then true cancers and this would expose the patient to unnecessary procedures as well as unnecessary radiation. Scans cannot answer the question the patient really would like to know, which is whether she has microscopic disease elsewhere in her body. For those reasons we do not recommend them.  Of course we would proceed to aggressive evaluation of any symptoms that might suggest metastatic disease, but that is not the case here.  Next we went over the options for systemic therapy which are anti-estrogens, anti-HER-2 immunotherapy, and chemotherapy. Chiyoko does not meet criteria for anti-HER-2 immunotherapy. She is a good candidate for anti-estrogens.  The question of chemotherapy is more complicated. Chemotherapy is most effective in rapidly growing, aggressive tumors. It is much less effective inintermediatee, slow growing cancers, like Azriel 's. For that reason we are going to request an Oncotype from the definitive surgical sample, as suggested by NCCN guidelines. That will help [place] make a definitive decision regarding chemotherapy in this case.  Dwana tells me that if she does end up needing a mastectomy she would want reconstruction. This means she would have  to meet with a plastic surgeon and once she decides which type of reconstruction she would want that surgeon and Dr. Ninfa Linden schedules would have to mash. All this may result in some delay.  Accordingly we are starting anastrozole now. That means  she can take her time making her surgical decision without any time pressure.  We discussed the possible toxicities, side effects and complications of anastrozole today I went ahead and entered the order for her to begin. She will call me with any problems she may experience from that medication. Otherwise she will return to see me the second half of January, by which time she should've had her definitive surgery and we should have the results of her Oncotype so we can make an informed decision regarding chemotherapy   Adalyna  has a good understanding of the overall plan. She agrees with it. She knows the goal of treatment in her case is cure. She will call with any problems that may develop before her next visit here.  Chauncey Cruel, MD   09/10/2016 2:05 PM Medical Oncology and Hematology [facility] [address] [place], [place] [address] Tel. [phone]    Fax. [phone]

## 2016-09-15 ENCOUNTER — Encounter: Payer: Self-pay | Admitting: *Deleted

## 2016-09-15 ENCOUNTER — Ambulatory Visit
Admission: RE | Admit: 2016-09-15 | Discharge: 2016-09-15 | Disposition: A | Discharge status: Home / Self Care | Payer: BC Managed Care – PPO | Source: Ambulatory Visit | Attending: Surgery | Admitting: Surgery

## 2016-09-15 DIAGNOSIS — Z170 Estrogen receptor positive status [ER+]: Principal | ICD-10-CM

## 2016-09-15 DIAGNOSIS — C50412 Malignant neoplasm of upper-outer quadrant of left female breast: Secondary | ICD-10-CM

## 2016-09-15 MED ORDER — GADOBENATE DIMEGLUMINE 529 MG/ML IV SOLN
10.0000 mL | Freq: Once | INTRAVENOUS | Status: AC | PRN
  Administered 2016-09-15: 10 mL via INTRAVENOUS

## 2016-09-15 NOTE — Progress Notes (Signed)
[facility] Psychosocial Distress Screening Clinical Social Work  Clinical Social Work was referred by distress screening protocol.  The patient scored a 7 on the Psychosocial Distress Thermometer which indicates severe distress. Clinical Social Worker reviewed chart and phoned pt to assess for distress and other psychosocial needs. CSW left brief, HIPPA compliant message for pt to return call as appropriate.   ON[facility] DISTRESS SCREENING 09/07/2016  Screening Type Initial Screening  Distress experienced in past week (1-10) 7  Emotional problem type Adjusting to illness  Information Concerns Type Lack of info about treatment  Physical Problem type Sleep/insomnia    Clinical Social Worker follow up needed: No.  If yes, follow up plan:  Loren Racer, [place]  [facility] Phone: [phone] Fax: [phone]

## 2016-09-20 ENCOUNTER — Other Ambulatory Visit: Payer: Self-pay | Admitting: Surgery

## 2016-09-20 DIAGNOSIS — C50912 Malignant neoplasm of unspecified site of left female breast: Secondary | ICD-10-CM

## 2016-09-21 ENCOUNTER — Other Ambulatory Visit: Payer: Self-pay | Admitting: Surgery

## 2016-09-21 DIAGNOSIS — C50912 Malignant neoplasm of unspecified site of left female breast: Secondary | ICD-10-CM

## 2016-09-22 ENCOUNTER — Other Ambulatory Visit: Payer: Self-pay | Admitting: Surgery

## 2016-09-22 ENCOUNTER — Ambulatory Visit
Admission: RE | Admit: 2016-09-22 | Discharge: 2016-09-22 | Disposition: A | Discharge status: Home / Self Care | Payer: BC Managed Care – PPO | Source: Ambulatory Visit | Attending: Surgery | Admitting: Surgery

## 2016-09-22 DIAGNOSIS — C50912 Malignant neoplasm of unspecified site of left female breast: Secondary | ICD-10-CM

## 2016-09-24 ENCOUNTER — Other Ambulatory Visit: Payer: Self-pay | Admitting: Surgery

## 2016-09-24 DIAGNOSIS — N63 Unspecified lump in unspecified breast: Secondary | ICD-10-CM

## 2016-09-28 ENCOUNTER — Ambulatory Visit
Admission: RE | Admit: 2016-09-28 | Discharge: 2016-09-28 | Disposition: A | Discharge status: Home / Self Care | Payer: BC Managed Care – PPO | Source: Ambulatory Visit | Attending: Surgery | Admitting: Surgery

## 2016-09-28 DIAGNOSIS — N63 Unspecified lump in unspecified breast: Secondary | ICD-10-CM

## 2016-09-28 MED ORDER — GADOBENATE DIMEGLUMINE 529 MG/ML IV SOLN
10.0000 mL | Freq: Once | INTRAVENOUS | Status: AC | PRN
  Administered 2016-09-28: 10 mL via INTRAVENOUS

## 2016-10-12 ENCOUNTER — Other Ambulatory Visit: Payer: Self-pay | Admitting: Family Medicine

## 2016-10-15 ENCOUNTER — Other Ambulatory Visit: Payer: Self-pay | Admitting: Surgery

## 2016-10-15 DIAGNOSIS — C50612 Malignant neoplasm of axillary tail of left female breast: Secondary | ICD-10-CM

## 2016-10-15 DIAGNOSIS — Z170 Estrogen receptor positive status [ER+]: Principal | ICD-10-CM

## 2016-10-22 ENCOUNTER — Telehealth: Payer: Self-pay | Admitting: *Deleted

## 2016-10-22 NOTE — Telephone Encounter (Signed)
Received call back from patient. Informed her we would cancel her appointment with Dr. Jana Hakim for 1/16 because she has not had surgery yet.  Informed we would reschedule when we know the surgery date and her oncotype results back.  Patient verbalized understanding.

## 2016-10-22 NOTE — Telephone Encounter (Signed)
Left message for a return phone call to reschedule appt with Dr. Jana Hakim as she has not had surgery yet.

## 2016-10-26 ENCOUNTER — Ambulatory Visit: Payer: BC Managed Care – PPO | Admitting: Oncology

## 2016-10-29 ENCOUNTER — Telehealth: Payer: Self-pay | Admitting: *Deleted

## 2016-10-29 NOTE — Telephone Encounter (Signed)
Lm on pts vm requesting a call back 

## 2016-10-29 NOTE — Telephone Encounter (Signed)
Patient left a voicemail requesting a sleep aid. Patient stated that she was diagnosed with breast cancer in November and the medication they have her on is causing hot flashes and she is having difficulty sleeping. Pharmacy- [facility]

## 2016-10-29 NOTE — Telephone Encounter (Signed)
I am sorry she is having difficulty sleeping.  We can call in a small number of ambien but she needs to be seen so we can discussed options.

## 2016-11-01 MED ORDER — ZOLPIDEM TARTRATE 5 MG PO TABS: 5.0000 mg | ORAL_TABLET | Freq: Every evening | ORAL | 0 refills | Status: DC | PRN

## 2016-11-01 NOTE — Telephone Encounter (Signed)
Spoke to pt and advised per Dr Deborra Medina. Rx called in to requested pharmacy

## 2016-11-02 ENCOUNTER — Telehealth: Payer: Self-pay | Admitting: Oncology

## 2016-11-02 NOTE — Telephone Encounter (Signed)
lvm to inform pt of 2/20 appt at 930 am per LOS

## 2016-11-03 ENCOUNTER — Encounter (HOSPITAL_BASED_OUTPATIENT_CLINIC_OR_DEPARTMENT_OTHER): Payer: Self-pay | Admitting: *Deleted

## 2016-11-05 NOTE — Pre-Procedure Instructions (Signed)
Boost Breeze 8 oz. given to pt. with instructions to drink by 0400 DOS; pt. voiced understanding.

## 2016-11-08 NOTE — Anesthesia Preprocedure Evaluation (Signed)
Anesthesia Evaluation  Patient identified by MRN, date of birth, ID band Patient awake    Reviewed: Allergy & Precautions, NPO status , Patient's Chart, lab work & pertinent test results  Airway Mallampati: II  TM Distance: >3 FB Neck ROM: Full    Dental no notable dental hx.    Pulmonary former smoker,    Pulmonary exam normal breath sounds clear to auscultation       Cardiovascular negative cardio ROS Normal cardiovascular exam Rhythm:Regular Rate:Normal     Neuro/Psych PSYCHIATRIC DISORDERS Anxiety Depression negative neurological ROS     GI/Hepatic negative GI ROS, Neg liver ROS,   Endo/Other  negative endocrine ROS  Renal/GU negative Renal ROS     Musculoskeletal negative musculoskeletal ROS (+)   Abdominal   Peds  Hematology negative hematology ROS (+)   Anesthesia Other Findings   Reproductive/Obstetrics negative OB ROS                             Anesthesia Physical Anesthesia Plan  ASA: II  Anesthesia Plan: General   Post-op Pain Management: GA combined w/ Regional for post-op pain   Induction: Intravenous  Airway Management Planned: LMA  Additional Equipment:   Intra-op Plan:   Post-operative Plan: Extubation in OR  Informed Consent: I have reviewed the patients History and Physical, chart, labs and discussed the procedure including the risks, benefits and alternatives for the proposed anesthesia with the patient or authorized representative who has indicated his/her understanding and acceptance.   Dental advisory given  Plan Discussed with: CRNA  Anesthesia Plan Comments:         Anesthesia Quick Evaluation

## 2016-11-08 NOTE — H&P (Signed)
Kathy Bishop  Location: [facility] Patient #: [id number] DOB: 1951/08/14 Married / Language: English / Race: White Female   History of Present Illness (Mattalynn Crandle A. Ninfa Linden MD;  The patient is a 66 year old female who presents with breast cancer. This is a pleasant 66 year old female referred by Dr. Arnette Norris after the recent diagnosis of a left breast cancer. This was found on screening mammography. She had not had a mammogram in several years and actually reports feeling a mass in her breast for almost a year. She denies nipple discharge. She has no previous history of breast problems or cancers in her breast. Her only family history is having a paternal aunt who had breast cancer years ago. She is otherwise healthy without complaints.   Other Problems  Depression   Past Surgical History  Breast Biopsy  Left.  Diagnostic Studies History Colonoscopy  >10 years ago Mammogram  within last year Pap Smear  1-5 years ago  Allergies No Known Drug Allergies  Medication History Escitalopram Oxalate (20MG  Tablet, Oral) Active. Premarin (0.3MG  Tablet, Oral) Active. Progesterone Micronized (100MG  Capsule, Oral) Active. Medications Reconciled  Social History Patsey Berthold, [place];  Alcohol use  Occasional alcohol use. Caffeine use  Coffee. No drug use  Tobacco use  Former smoker.  Family History Patsey Berthold, [employer];  Alcohol Abuse  Father. Cancer  Father. Depression  Sister. Migraine Headache  Sister.  Pregnancy / Birth History9:45 AM) Age at menarche  51 years. Age of menopause  20-61 Gravida  0    Review of Systems Patsey Berthold CMA; General Present- Fatigue. Not Present- Appetite Loss, Chills, Fever, Night Sweats, Weight Gain and Weight Loss. Skin Not Present- Change in Wart/Mole, Dryness, Hives, Jaundice, New Lesions, Non-Healing Wounds, Rash and Ulcer. HEENT Present- Wears glasses/contact lenses. Not Present- Earache, Hearing  Loss, Hoarseness, Nose Bleed, Oral Ulcers, Ringing in the Ears, Seasonal Allergies, Sinus Pain, Sore Throat, Visual Disturbances and Yellow Eyes. Respiratory Not Present- Bloody sputum, Chronic Cough, Difficulty Breathing, Snoring and Wheezing. Breast Present- Breast Mass. Not Present- Breast Pain, Nipple Discharge and Skin Changes. Cardiovascular Not Present- Chest Pain, Difficulty Breathing Lying Down, Leg Cramps, Palpitations, Rapid Heart Rate, Shortness of Breath and Swelling of Extremities. Gastrointestinal Not Present- Abdominal Pain, Bloating, Bloody Stool, Change in Bowel Habits, Chronic diarrhea, Constipation, Difficulty Swallowing, Excessive gas, Gets full quickly at meals, Hemorrhoids, Indigestion, Nausea, Rectal Pain and Vomiting. Female Genitourinary Not Present- Frequency, Nocturia, Painful Urination, Pelvic Pain and Urgency. Musculoskeletal Not Present- Back Pain, Joint Pain, Joint Stiffness, Muscle Pain, Muscle Weakness and Swelling of Extremities. Neurological Not Present- Decreased Memory, Fainting, Headaches, Numbness, Seizures, Tingling, Tremor, Trouble walking and Weakness. Psychiatric Present- Anxiety and Fearful. Not Present- Bipolar, Change in Sleep Pattern, Depression and Frequent crying. Endocrine Not Present- Cold Intolerance, Excessive Hunger, Hair Changes, Heat Intolerance, Hot flashes and New Diabetes. Hematology Not Present- Blood Thinners, Easy Bruising, Excessive bleeding, Gland problems, HIV and Persistent Infections.  Vitals   Weight: 115.4 lb Height: 63in Height was reported by patient. Body Surface Area: 1.53 m Body Mass Index: 20.44 kg/m  Temp.: 98.2F  Pulse: 83 (Regular)  BP: 148/100 (Sitting, Left Arm, Standard)       Physical Exam General Mental Status-Alert. General Appearance-Consistent with stated age. Hydration-Well hydrated. Voice-Normal.  Head and Neck Head-normocephalic, atraumatic with no lesions or palpable  masses. Trachea-midline. Thyroid Gland Characteristics - normal size and consistency.  Eye Eyeball - Bilateral-Extraocular movements intact. Sclera/Conjunctiva - Bilateral-No scleral icterus.  Chest and Lung Exam Chest  and lung exam reveals -quiet, even and easy respiratory effort with no use of accessory muscles and on auscultation, normal breath sounds, no adventitious sounds and normal vocal resonance. Inspection Chest Wall - Normal. Back - normal.  Breast Breast - Left-Symmetric and Biopsy scar, Non Tender, No Dimpling, No Inflammation, No Lumpectomy scars, No Mastectomy scars, No Peau d' Orange. Note: There is a 2-3 cm, mobile mass in the upper outer quadrant of the left breast at approximately 2:00. There is no axillary adenopathy on side. There is moderate ecchymosis from the biopsy Breast - Right-Symmetric, Non Tender, No Biopsy scars, no Dimpling, No Inflammation, No Lumpectomy scars, No Mastectomy scars, No Peau d' Orange. Breast Lump-No Palpable Breast Mass.  Cardiovascular Cardiovascular examination reveals -normal heart sounds, regular rate and rhythm with no murmurs and normal pedal pulses bilaterally.  Abdomen - Did not examine.  Neurologic - Did not examine.  Musculoskeletal Normal Exam - Left-Upper Extremity Strength Normal and Lower Extremity Strength Normal. Normal Exam - Right-Upper Extremity Strength Normal and Lower Extremity Strength Normal.  Lymphatic Head & Neck  General Head & Neck Lymphatics: Bilateral - Description - Normal. Axillary  General Axillary Region: Bilateral - Description - Normal. Tenderness - Non Tender. Femoral & Inguinal - Did not examine.    Assessment & Plan  BREAST CANCER, LEFT (C50.912)  Impression: This is a patient with a new diagnosis of a left breast cancer. The pathology showed invasive ductal carcinoma. A satellite lesion was also biopsied and found to be positive for invasive cancer. She is ER and  PR positive. Ultrasound was negative for any enlarged lymph nodes in the axilla and clinically her axilla is negative. I discussed the diagnosis with the patient and her husband. Because of the satellite lesion, we are recommending an MRI to determine the total extent of disease and whether breast conservation can be performed. We will also refer her to both medical and radiation oncology. She may need neoadjuvant therapy based on the MRI. I had a discussion with them also regarding the potential need for mastectomy and reconstruction. I will call her back with the results of the MRI  ADDDENDUM: The patient is a 66 year old female who presents with breast cancer. She is here for a follow-up regarding her left breast cancer. She had a second 3 mm nodule biopsied under MRI guidance and this was benign. She has seen the oncologist and is currently taking anti-hormonal medications ordered by the oncologist therapy preoperatively. Again, the total extent of disease isn't proximally 6.5 cm in the upper outer quadrant.  BREAST CANCER, LEFT (C50.912) Impression: We again discussed breast conservation with lumpectomy and sentinel lymph node biopsy versus a mastectomy with reconstruction. She was to go ahead and try conservative management which I believe is reasonable. Her biggest risks of surgery is the possibility of  positive margins and need for further reexcision versus mastectomy in the future. I also discussed the risk of surgery with her in detail and she wishes to proceed as soon as possible. We will proceed with a left breast lumpectomy and sentinel lymph node biopsy.

## 2016-11-09 ENCOUNTER — Ambulatory Visit (HOSPITAL_BASED_OUTPATIENT_CLINIC_OR_DEPARTMENT_OTHER): Payer: BC Managed Care – PPO | Admitting: Anesthesiology

## 2016-11-09 ENCOUNTER — Encounter (HOSPITAL_BASED_OUTPATIENT_CLINIC_OR_DEPARTMENT_OTHER): Payer: Self-pay | Admitting: *Deleted

## 2016-11-09 ENCOUNTER — Ambulatory Visit (HOSPITAL_COMMUNITY)
Admission: RE | Admit: 2016-11-09 | Discharge: 2016-11-09 | Disposition: A | Discharge status: Home / Self Care | Payer: BC Managed Care – PPO | Source: Ambulatory Visit | Attending: Surgery | Admitting: Surgery

## 2016-11-09 ENCOUNTER — Ambulatory Visit (HOSPITAL_BASED_OUTPATIENT_CLINIC_OR_DEPARTMENT_OTHER)
Admission: RE | Admit: 2016-11-09 | Discharge: 2016-11-09 | Disposition: A | Discharge status: Home / Self Care | Payer: BC Managed Care – PPO | Source: Ambulatory Visit | Attending: Surgery | Admitting: Surgery

## 2016-11-09 ENCOUNTER — Encounter (HOSPITAL_BASED_OUTPATIENT_CLINIC_OR_DEPARTMENT_OTHER)
Admission: RE | Disposition: A | Discharge status: Home / Self Care | Payer: Self-pay | Source: Ambulatory Visit | Attending: Surgery

## 2016-11-09 DIAGNOSIS — F329 Major depressive disorder, single episode, unspecified: Secondary | ICD-10-CM | POA: Insufficient documentation

## 2016-11-09 DIAGNOSIS — C50412 Malignant neoplasm of upper-outer quadrant of left female breast: Secondary | ICD-10-CM | POA: Insufficient documentation

## 2016-11-09 DIAGNOSIS — Z79899 Other long term (current) drug therapy: Secondary | ICD-10-CM | POA: Diagnosis not present

## 2016-11-09 DIAGNOSIS — C50612 Malignant neoplasm of axillary tail of left female breast: Secondary | ICD-10-CM

## 2016-11-09 DIAGNOSIS — Z170 Estrogen receptor positive status [ER+]: Secondary | ICD-10-CM | POA: Insufficient documentation

## 2016-11-09 DIAGNOSIS — Z79811 Long term (current) use of aromatase inhibitors: Secondary | ICD-10-CM | POA: Insufficient documentation

## 2016-11-09 DIAGNOSIS — Z87891 Personal history of nicotine dependence: Secondary | ICD-10-CM | POA: Diagnosis not present

## 2016-11-09 HISTORY — DX: Anxiety disorder, unspecified: F41.9

## 2016-11-09 HISTORY — PX: BREAST LUMPECTOMY WITH SENTINEL LYMPH NODE BIOPSY: SHX5597

## 2016-11-09 SURGERY — BREAST LUMPECTOMY WITH SENTINEL LYMPH NODE BX
Anesthesia: General | Site: Breast | Laterality: Left

## 2016-11-09 MED ORDER — METHYLENE BLUE 0.5 % INJ SOLN
INTRAVENOUS | Status: DC | PRN
Start: 2016-11-09 — End: 2016-11-09
  Administered 2016-11-09: 3 mL via SUBMUCOSAL

## 2016-11-09 MED ORDER — FENTANYL CITRATE (PF) 100 MCG/2ML IJ SOLN
INTRAMUSCULAR | Status: AC
  Filled 2016-11-09: qty 2

## 2016-11-09 MED ORDER — MIDAZOLAM HCL 2 MG/2ML IJ SOLN
1.0000 mg | INTRAMUSCULAR | Status: DC | PRN
  Administered 2016-11-09 (×2): 1 mg via INTRAVENOUS

## 2016-11-09 MED ORDER — SODIUM CHLORIDE 0.9 % IJ SOLN
INTRAMUSCULAR | Status: DC | PRN
  Administered 2016-11-09: 2 mL

## 2016-11-09 MED ORDER — ONDANSETRON HCL 4 MG/2ML IJ SOLN
INTRAMUSCULAR | Status: AC
  Filled 2016-11-09: qty 2

## 2016-11-09 MED ORDER — PHENYLEPHRINE 40 MCG/ML (10ML) SYRINGE FOR IV PUSH (FOR BLOOD PRESSURE SUPPORT)
PREFILLED_SYRINGE | INTRAVENOUS | Status: AC
  Filled 2016-11-09: qty 10

## 2016-11-09 MED ORDER — DEXAMETHASONE SODIUM PHOSPHATE 4 MG/ML IJ SOLN
INTRAMUSCULAR | Status: DC | PRN
  Administered 2016-11-09: 8 mg via INTRAVENOUS

## 2016-11-09 MED ORDER — EPHEDRINE SULFATE 50 MG/ML IJ SOLN
INTRAMUSCULAR | Status: DC | PRN
  Administered 2016-11-09: 10 mg via INTRAVENOUS

## 2016-11-09 MED ORDER — LACTATED RINGERS IV SOLN
INTRAVENOUS | Status: DC
  Administered 2016-11-09: 07:00:00 via INTRAVENOUS

## 2016-11-09 MED ORDER — BUPIVACAINE-EPINEPHRINE (PF) 0.5% -1:200000 IJ SOLN
INTRAMUSCULAR | Status: AC
  Filled 2016-11-09: qty 30

## 2016-11-09 MED ORDER — LIDOCAINE HCL (CARDIAC) 20 MG/ML IV SOLN
INTRAVENOUS | Status: DC | PRN
  Administered 2016-11-09: 30 mg via INTRAVENOUS

## 2016-11-09 MED ORDER — HYDROMORPHONE HCL 1 MG/ML IJ SOLN: 0.2500 mg | INTRAMUSCULAR | Status: DC | PRN

## 2016-11-09 MED ORDER — BUPIVACAINE-EPINEPHRINE 0.5% -1:200000 IJ SOLN
INTRAMUSCULAR | Status: DC | PRN
  Administered 2016-11-09: 10 mL

## 2016-11-09 MED ORDER — FENTANYL CITRATE (PF) 100 MCG/2ML IJ SOLN
50.0000 ug | INTRAMUSCULAR | Status: DC | PRN
  Administered 2016-11-09 (×2): 50 ug via INTRAVENOUS

## 2016-11-09 MED ORDER — DEXAMETHASONE SODIUM PHOSPHATE 10 MG/ML IJ SOLN
INTRAMUSCULAR | Status: AC
  Filled 2016-11-09: qty 1

## 2016-11-09 MED ORDER — OXYCODONE HCL 5 MG PO TABS: 5.0000 mg | ORAL_TABLET | Freq: Once | ORAL | Status: DC | PRN

## 2016-11-09 MED ORDER — CEFAZOLIN SODIUM-DEXTROSE 2-4 GM/100ML-% IV SOLN
INTRAVENOUS | Status: AC
  Filled 2016-11-09: qty 100

## 2016-11-09 MED ORDER — SODIUM CHLORIDE 0.9 % IJ SOLN
INTRAMUSCULAR | Status: AC
  Filled 2016-11-09: qty 10

## 2016-11-09 MED ORDER — MEPERIDINE HCL 25 MG/ML IJ SOLN: 6.2500 mg | INTRAMUSCULAR | Status: DC | PRN

## 2016-11-09 MED ORDER — METHYLENE BLUE 0.5 % INJ SOLN
INTRAVENOUS | Status: AC
  Filled 2016-11-09: qty 10

## 2016-11-09 MED ORDER — CHLORHEXIDINE GLUCONATE CLOTH 2 % EX PADS: 6.0000 | MEDICATED_PAD | Freq: Once | CUTANEOUS | Status: DC

## 2016-11-09 MED ORDER — MIDAZOLAM HCL 2 MG/2ML IJ SOLN
INTRAMUSCULAR | Status: AC
Start: 2016-11-09 — End: 2016-11-09
  Filled 2016-11-09: qty 2

## 2016-11-09 MED ORDER — MIDAZOLAM HCL 2 MG/2ML IJ SOLN
INTRAMUSCULAR | Status: AC
  Filled 2016-11-09: qty 2

## 2016-11-09 MED ORDER — EPHEDRINE 5 MG/ML INJ
INTRAVENOUS | Status: AC
  Filled 2016-11-09: qty 10

## 2016-11-09 MED ORDER — PROMETHAZINE HCL 25 MG/ML IJ SOLN: 6.2500 mg | INTRAMUSCULAR | Status: DC | PRN

## 2016-11-09 MED ORDER — HYDROCODONE-ACETAMINOPHEN 5-325 MG PO TABS: 1.0000 | ORAL_TABLET | ORAL | 0 refills | Status: DC | PRN

## 2016-11-09 MED ORDER — SUCCINYLCHOLINE CHLORIDE 200 MG/10ML IV SOSY
PREFILLED_SYRINGE | INTRAVENOUS | Status: AC
  Filled 2016-11-09: qty 10

## 2016-11-09 MED ORDER — LIDOCAINE 2% (20 MG/ML) 5 ML SYRINGE
INTRAMUSCULAR | Status: AC
  Filled 2016-11-09: qty 5

## 2016-11-09 MED ORDER — SCOPOLAMINE 1 MG/3DAYS TD PT72: 1.0000 | MEDICATED_PATCH | Freq: Once | TRANSDERMAL | Status: DC | PRN

## 2016-11-09 MED ORDER — OXYCODONE HCL 5 MG/5ML PO SOLN: 5.0000 mg | Freq: Once | ORAL | Status: DC | PRN

## 2016-11-09 MED ORDER — PROPOFOL 10 MG/ML IV BOLUS
INTRAVENOUS | Status: DC | PRN
  Administered 2016-11-09: 150 mg via INTRAVENOUS

## 2016-11-09 MED ORDER — TECHNETIUM TC 99M SULFUR COLLOID FILTERED
1.0000 | Freq: Once | INTRAVENOUS | Status: AC | PRN
  Administered 2016-11-09: 1 via INTRADERMAL

## 2016-11-09 MED ORDER — CEFAZOLIN SODIUM-DEXTROSE 2-4 GM/100ML-% IV SOLN
2.0000 g | INTRAVENOUS | Status: AC
  Administered 2016-11-09: 2 g via INTRAVENOUS

## 2016-11-09 MED ORDER — BUPIVACAINE-EPINEPHRINE (PF) 0.5% -1:200000 IJ SOLN
INTRAMUSCULAR | Status: DC | PRN
  Administered 2016-11-09: 30 mL

## 2016-11-09 MED ORDER — ONDANSETRON HCL 4 MG/2ML IJ SOLN
INTRAMUSCULAR | Status: DC | PRN
  Administered 2016-11-09: 4 mg via INTRAVENOUS

## 2016-11-09 SURGICAL SUPPLY — 57 items
ADH SKN CLS APL DERMABOND .7 (GAUZE/BANDAGES/DRESSINGS) ×1
APPLIER CLIP 9.375 MED OPEN (MISCELLANEOUS) ×3
APR CLP MED 9.3 20 MLT OPN (MISCELLANEOUS) ×1
BINDER BREAST MEDIUM (GAUZE/BANDAGES/DRESSINGS) ×3 IMPLANT
BLADE CLIPPER SURG (BLADE) IMPLANT
BLADE HEX COATED 2.75 (ELECTRODE) ×3 IMPLANT
BLADE SURG 15 STRL LF DISP TIS (BLADE) ×2 IMPLANT
BLADE SURG 15 STRL SS (BLADE) ×6
CANISTER SUCT 1200ML W/VALVE (MISCELLANEOUS) ×3 IMPLANT
CHLORAPREP W/TINT 26ML (MISCELLANEOUS) ×3 IMPLANT
CLIP APPLIE 9.375 MED OPEN (MISCELLANEOUS) ×1 IMPLANT
COVER BACK TABLE 60X90IN (DRAPES) ×6 IMPLANT
COVER MAYO STAND STRL (DRAPES) ×3 IMPLANT
COVER PROBE W GEL 5X96 (DRAPES) ×3 IMPLANT
DECANTER SPIKE VIAL GLASS SM (MISCELLANEOUS) ×3 IMPLANT
DERMABOND ADVANCED (GAUZE/BANDAGES/DRESSINGS) ×2
DERMABOND ADVANCED .7 DNX12 (GAUZE/BANDAGES/DRESSINGS) ×1 IMPLANT
DEVICE DUBIN W/COMP PLATE 8390 (MISCELLANEOUS) IMPLANT
DRAIN CHANNEL 19F RND (DRAIN) IMPLANT
DRAIN HEMOVAC 1/8 X 5 (WOUND CARE) IMPLANT
DRAPE LAPAROSCOPIC ABDOMINAL (DRAPES) ×3 IMPLANT
DRAPE UTILITY XL STRL (DRAPES) ×3 IMPLANT
ELECT REM PT RETURN 9FT ADLT (ELECTROSURGICAL) ×3
ELECTRODE REM PT RTRN 9FT ADLT (ELECTROSURGICAL) ×1 IMPLANT
EVACUATOR SILICONE 100CC (DRAIN) IMPLANT
GLOVE BIOGEL PI IND STRL 7.0 (GLOVE) ×2 IMPLANT
GLOVE BIOGEL PI INDICATOR 7.0 (GLOVE) ×4
GLOVE ECLIPSE 6.5 STRL STRAW (GLOVE) ×3 IMPLANT
GLOVE SURG SIGNA 7.5 PF LTX (GLOVE) ×3 IMPLANT
GOWN STRL REUS W/ TWL LRG LVL3 (GOWN DISPOSABLE) ×1 IMPLANT
GOWN STRL REUS W/ TWL XL LVL3 (GOWN DISPOSABLE) ×1 IMPLANT
GOWN STRL REUS W/TWL LRG LVL3 (GOWN DISPOSABLE) ×3
GOWN STRL REUS W/TWL XL LVL3 (GOWN DISPOSABLE) ×3
HEMOSTAT SURGICEL 2X14 (HEMOSTASIS) IMPLANT
KIT MARKER MARGIN INK (KITS) ×3 IMPLANT
NDL SAFETY ECLIPSE 18X1.5 (NEEDLE) ×1 IMPLANT
NEEDLE HYPO 18GX1.5 SHARP (NEEDLE) ×3
NEEDLE HYPO 25X1 1.5 SAFETY (NEEDLE) ×6 IMPLANT
NS IRRIG 1000ML POUR BTL (IV SOLUTION) ×3 IMPLANT
PACK BASIN DAY SURGERY FS (CUSTOM PROCEDURE TRAY) ×3 IMPLANT
PENCIL BUTTON HOLSTER BLD 10FT (ELECTRODE) ×3 IMPLANT
PIN SAFETY STERILE (MISCELLANEOUS) IMPLANT
SLEEVE SCD COMPRESS KNEE MED (MISCELLANEOUS) ×3 IMPLANT
SPONGE GAUZE 4X4 12PLY STER LF (GAUZE/BANDAGES/DRESSINGS) IMPLANT
SPONGE LAP 18X18 X RAY DECT (DISPOSABLE) IMPLANT
SPONGE LAP 4X18 X RAY DECT (DISPOSABLE) ×3 IMPLANT
SUT ETHILON 3 0 PS 1 (SUTURE) IMPLANT
SUT MNCRL AB 4-0 PS2 18 (SUTURE) ×6 IMPLANT
SUT VIC AB 3-0 SH 27 (SUTURE) ×6
SUT VIC AB 3-0 SH 27X BRD (SUTURE) ×2 IMPLANT
SYR BULB 3OZ (MISCELLANEOUS) ×3 IMPLANT
SYR CONTROL 10ML LL (SYRINGE) ×6 IMPLANT
TOWEL OR 17X24 6PK STRL BLUE (TOWEL DISPOSABLE) ×3 IMPLANT
TOWEL OR NON WOVEN STRL DISP B (DISPOSABLE) IMPLANT
TUBE CONNECTING 20'X1/4 (TUBING) ×1
TUBE CONNECTING 20X1/4 (TUBING) ×2 IMPLANT
YANKAUER SUCT BULB TIP NO VENT (SUCTIONS) ×3 IMPLANT

## 2016-11-09 NOTE — Interval H&P Note (Signed)
History and Physical Interval Note:no change in H and P  11/09/2016 7:07 AM  Kathy Bishop  has presented today for surgery, with the diagnosis of LEFT BREAST CANCER  The various methods of treatment have been discussed with the patient and family. After consideration of risks, benefits and other options for treatment, the patient has consented to  Procedure(s): LEFT BREAST LUMPECTOMY WITH SENTINEL LYMPH NODE BX (Left) as a surgical intervention .  The patient's history has been reviewed, patient examined, no change in status, stable for surgery.  I have reviewed the patient's chart and labs.  Questions were answered to the patient's satisfaction.     Kateri Balch A

## 2016-11-09 NOTE — Anesthesia Procedure Notes (Signed)
Procedure Name: LMA Insertion Date/Time: 11/09/2016 7:34 AM Performed by: Melynda Ripple D Pre-anesthesia Checklist: Patient identified, Emergency Drugs available, Suction available and Patient being monitored Patient Re-evaluated:Patient Re-evaluated prior to inductionOxygen Delivery Method: Circle system utilized Preoxygenation: Pre-oxygenation with 100% oxygen Intubation Type: IV induction Ventilation: Mask ventilation without difficulty LMA: LMA inserted LMA Size: 3.0 Number of attempts: 1 Airway Equipment and Method: Bite block Placement Confirmation: positive ETCO2 Tube secured with: Tape Dental Injury: Teeth and Oropharynx as per pre-operative assessment

## 2016-11-09 NOTE — Transfer of Care (Signed)
Immediate Anesthesia Transfer of Care Note  Patient: Kathy Bishop  Procedure(s) Performed: Procedure(s): LEFT BREAST LUMPECTOMY WITH SENTINEL LYMPH NODE BX (Left)  Patient Location: PACU  Anesthesia Type:GA combined with regional for post-op pain  Level of Consciousness: sedated  Airway & Oxygen Therapy: Patient Spontanous Breathing  Post-op Assessment: Report given to RN and Post -op Vital signs reviewed and stable  Post vital signs: Reviewed and stable  Last Vitals:  Vitals:   11/09/16 0720 11/09/16 0724  BP: 126/71   Pulse: 74 76  Resp: 12 17  Temp:      Last Pain:  Vitals:   11/09/16 0644  TempSrc: Oral         Complications: No apparent anesthesia complications

## 2016-11-09 NOTE — Progress Notes (Signed)
Assisted Dr. Germeroth with left, ultrasound guided, pectoralis block. Side rails up, monitors on throughout procedure. See vital signs in flow sheet. Tolerated Procedure well. 

## 2016-11-09 NOTE — Progress Notes (Signed)
Nuc med staff performed nuc med inj. Additional sedation given for comfort. Husband Clair Gulling called to bedside, updated and emotional support given

## 2016-11-09 NOTE — Anesthesia Postprocedure Evaluation (Signed)
Anesthesia Post Note  Patient: Kathy Bishop  Procedure(s) Performed: Procedure(s) (LRB): LEFT BREAST LUMPECTOMY WITH SENTINEL LYMPH NODE BX (Left)  Patient location during evaluation: PACU Anesthesia Type: General and Regional Level of consciousness: sedated and patient cooperative Pain management: pain level controlled Vital Signs Assessment: post-procedure vital signs reviewed and stable Respiratory status: spontaneous breathing Cardiovascular status: stable Anesthetic complications: no       Last Vitals:  Vitals:   11/09/16 0900 11/09/16 0940  BP: 136/75 138/78  Pulse: 81 82  Resp: 14 18  Temp:  36.5 C    Last Pain:  Vitals:   11/09/16 0940  TempSrc:   PainSc: 2                  Nolon Nations

## 2016-11-09 NOTE — Op Note (Signed)
LEFT BREAST LUMPECTOMY WITH SENTINEL LYMPH NODE BX  Procedure Note  Kathy Bishop 11/09/2016   Pre-op Diagnosis: LEFT BREAST CANCER     Post-op Diagnosis: same  Procedure(s): LEFT BREAST LUMPECTOMY WITH SENTINEL LYMPH NODE BX DEEP LEFT AXILLARY LYMPH NODE INJECTION OF BLUE DYE  Surgeon(s): Coralie Keens, MD  Anesthesia: General  Staff:  Circulator: Lisbeth Ply, RN Scrub Person: Rhea Pink Neiers, CST  Estimated Blood Loss: 20 cc               Specimens: sent to path  Indications: This is a 66 year old female with a large, palpable left breast cancer in the upper outer quadrant. The decision has been made to proceed with an attempt at a lumpectomy and sentinel lymph node biopsy.  Procedure: The patient was met in the holding area. Anesthesiology performed the pectoral nerve block on the left side. The radiation technologist injected radioactive isotope around the areola. The patient was then taken to the operating room. She was placed in a supine position on the operating table and general anesthesia was induced. I then injected blue dye underneath the areola of the left breast and massaged the breast. Her left breast, axilla, and chest were then prepped and draped in the usual sterile fashion. I made a longitudinal incision along the patient's left flank at the very lateral edge of the breast. I then took this down into the breast tissue with electrocautery. I then tunneled underneath the skin medially going up to the palpable tumor. I then attempted to stay widely around the tumor with electrocautery and with all we down to the chest wall and fascia. The tumor itself did not appear to be fixated to the fascia or chest wall but I did remove the fascia over the muscle. I then was able to come around the tumor at the inferior portion and work back toward the axilla performing the wide lumpectomy of the tissue. Once the lumpectomy specimen was removed, I marked all margins marker pain and  the specimen was sent to pathology for evaluation. With the aid is neoprobe and the blue dye I was then able to evaluate the axilla through this same incision. There were no enlarged lymph nodes palpable. Identified one sentinel lymph node with a mild uptake of radioactivity in blue dye. This again was in the deep axilla. I excise it in its entirety and sent to pathology for evaluation. Hemostasis was then achieved with the cautery. I placed surgical clips around the margins of the incision and anesthetized with Marcaine. I then closed subjacent tissue with interrupted 3-0 Vicryl sutures and closed skin with a running 4-0 Monocryl. Skin glue was then applied. The patient tolerated the procedure well. All the counts were correct at the end of the procedure. The patient was then extubated in the operating room and taken in a stable condition to the recovery room.          [name]   Date: 11/09/2016  Time: 8:29 AM

## 2016-11-09 NOTE — Discharge Instructions (Signed)
Post Anesthesia Home Care Instructions  Activity: Get plenty of rest for the remainder of the day. A responsible adult should stay with you for 24 hours following the procedure.  For the next 24 hours, DO NOT: -Drive a car -Paediatric nurse -Drink alcoholic beverages -Take any medication unless instructed by your physician -Make any legal decisions or sign important papers.  Meals: Start with liquid foods such as gelatin or soup. Progress to regular foods as tolerated. Avoid greasy, spicy, heavy foods. If nausea and/or vomiting occur, drink only clear liquids until the nausea and/or vomiting subsides. Call your physician if vomiting continues.  Special Instructions/Symptoms: Your throat may feel dry or sore from the anesthesia or the breathing tube placed in your throat during surgery. If this causes discomfort, gargle with warm salt water. The discomfort should disappear within 24 hours.  If you had a scopolamine patch placed behind your ear for the management of post- operative nausea and/or vomiting:  1. The medication in the patch is effective for 72 hours, after which it should be removed.  Wrap patch in a tissue and discard in the trash. Wash hands thoroughly with soap and water. 2. You may remove the patch earlier than 72 hours if you experience unpleasant side effects which may include dry mouth, dizziness or visual disturbances. 3. Avoid touching the patch. Wash your hands with soap and water after contact with the patch.   Call your surgeon if you experience:   1.  Fever over 101.0. 2.  Inability to urinate. 3.  Nausea and/or vomiting. 4.  Extreme swelling or bruising at the surgical site. 5.  Continued bleeding from the incision. 6.  Increased pain, redness or drainage from the incision. 7.  Problems related to your pain medication. 8.  Any problems and/or concerns   [employer] Phone Number [phone]  BREAST BIOPSY/ PARTIAL MASTECTOMY:  POST OP INSTRUCTIONS  Always review your discharge instruction sheet given to you by the facility where your surgery was performed.  IF YOU HAVE DISABILITY OR FAMILY LEAVE FORMS, YOU MUST BRING THEM TO THE OFFICE FOR PROCESSING.  DO NOT GIVE THEM TO YOUR DOCTOR.  1. A prescription for pain medication may be given to you upon discharge.  Take your pain medication as prescribed, if needed.  If narcotic pain medicine is not needed, then you may take acetaminophen (Tylenol) or ibuprofen (Advil) as needed. 2. Take your usually prescribed medications unless otherwise directed 3. If you need a refill on your pain medication, please contact your pharmacy.  They will contact our office to request authorization.  Prescriptions will not be filled after 5pm or on week-ends. 4. You should eat very light the first 24 hours after surgery, such as soup, crackers, pudding, etc.  Resume your normal diet the day after surgery. 5. Most patients will experience some swelling and bruising in the breast.  Ice packs and a good support bra will help.  Swelling and bruising can take several days to resolve.  6. It is common to experience some constipation if taking pain medication after surgery.  Increasing fluid intake and taking a stool softener will usually help or prevent this problem from occurring.  A mild laxative (Milk of Magnesia or Miralax) should be taken according to package directions if there are no bowel movements after 48 hours. 7. Unless discharge instructions indicate otherwise, you may remove your bandages 24-48 hours after surgery, and you may shower at that time.  You may have steri-strips (small  skin tapes) in place directly over the incision.  These strips should be left on the skin for 7-10 days.  If your surgeon used skin glue on the incision, you may shower in 24 hours.  The glue will flake off over the next 2-3 weeks.  Any sutures or staples will be removed at the office during your follow-up  visit. 8. ACTIVITIES:  You may resume regular daily activities (gradually increasing) beginning the next day.  Wearing a good support bra or sports bra minimizes pain and swelling.  You may have sexual intercourse when it is comfortable. a. You may drive when you no longer are taking prescription pain medication, you can comfortably wear a seatbelt, and you can safely maneuver your car and apply brakes. b. RETURN TO WORK:  ______________________________________________________________________________________ 9. You should see your doctor in the office for a follow-up appointment approximately two weeks after your surgery.  Your doctors nurse will typically make your follow-up appointment when she calls you with your pathology report.  Expect your pathology report 2-3 business days after your surgery.  You may call to check if you do not hear from [place] after three days. 10. OTHER INSTRUCTIONS: _KEEP BINDER ON TODAY.  WEAR IT FOR COMFORT AFTER TODAY. 11. OK TO SHOWER TOMORROW 12. ICE PACK AND IBUPROFEN ALSO FOR PAIN 13. ______________________________________________________________________________________________ _____________________________________________________________________________________________________________________________________ _____________________________________________________________________________________________________________________________________ _____________________________________________________________________________________________________________________________________  WHEN TO CALL YOUR DOCTOR: 1. Fever over 101.0 2. Nausea and/or vomiting. 3. Extreme swelling or bruising. 4. Continued bleeding from incision. 5. Increased pain, redness, or drainage from the incision.  The clinic staff is available to answer your questions during regular business hours.  Please dont hesitate to call and ask to speak to one of the nurses for clinical concerns.  If you have a  medical emergency, go to the nearest emergency room or call 911.  A surgeon from [facility] is always on call at the hospital.  For further questions, please visit [email or web address]

## 2016-11-09 NOTE — Anesthesia Procedure Notes (Addendum)
Anesthesia Regional Block:  Pectoralis block  Pre-Anesthetic Checklist: ,, timeout performed, Correct Patient, Correct Site, Correct Laterality, Correct Procedure, Correct Position, site marked, Risks and benefits discussed,  Surgical consent,  Pre-op evaluation,  At surgeon's request and post-op pain management  Laterality: Left  Prep: chloraprep       Needles:   Needle Type: Stimiplex     Needle Length: 9cm 9 cm     Additional Needles:  Procedures: ultrasound guided (picture in chart) Pectoralis block Narrative:  Start time: 11/09/2016 7:00 AM End time: 11/09/2016 7:10 AM Injection made incrementally with aspirations every 5 mL.  Performed by: Personally  Anesthesiologist: Nolon Nations  Additional Notes: Patient tolerated well. Good fascial spread noted.

## 2016-11-10 ENCOUNTER — Encounter (HOSPITAL_BASED_OUTPATIENT_CLINIC_OR_DEPARTMENT_OTHER): Payer: Self-pay | Admitting: Surgery

## 2016-11-11 ENCOUNTER — Telehealth: Payer: Self-pay | Admitting: *Deleted

## 2016-11-11 NOTE — Telephone Encounter (Signed)
Ordered oncotype per Dr. Jana Hakim.  Faxed requisition to pathology and confirmed receipt and faxed pa to [facility].

## 2016-11-16 ENCOUNTER — Other Ambulatory Visit: Payer: Self-pay | Admitting: Family Medicine

## 2016-11-23 ENCOUNTER — Telehealth: Payer: Self-pay | Admitting: *Deleted

## 2016-11-23 ENCOUNTER — Encounter (HOSPITAL_COMMUNITY): Payer: Self-pay

## 2016-11-23 NOTE — Telephone Encounter (Signed)
Received Oncotype score of 25/17%. Physician team notified. Pt scheduled to see Dr. Jana Hakim on 11/30/16.

## 2016-11-29 ENCOUNTER — Other Ambulatory Visit: Payer: Self-pay | Admitting: Surgery

## 2016-11-29 DIAGNOSIS — C50912 Malignant neoplasm of unspecified site of left female breast: Secondary | ICD-10-CM

## 2016-11-30 ENCOUNTER — Other Ambulatory Visit: Payer: Self-pay | Admitting: Surgery

## 2016-11-30 ENCOUNTER — Ambulatory Visit (HOSPITAL_BASED_OUTPATIENT_CLINIC_OR_DEPARTMENT_OTHER): Payer: BC Managed Care – PPO | Admitting: Oncology

## 2016-11-30 ENCOUNTER — Telehealth: Payer: Self-pay | Admitting: Oncology

## 2016-11-30 VITALS — BP 132/87 | HR 79 | Temp 98.2°F | Resp 17 | Ht 63.0 in | Wt 118.7 lb

## 2016-11-30 DIAGNOSIS — Z170 Estrogen receptor positive status [ER+]: Secondary | ICD-10-CM | POA: Diagnosis not present

## 2016-11-30 DIAGNOSIS — C50912 Malignant neoplasm of unspecified site of left female breast: Secondary | ICD-10-CM

## 2016-11-30 DIAGNOSIS — C50412 Malignant neoplasm of upper-outer quadrant of left female breast: Secondary | ICD-10-CM | POA: Diagnosis not present

## 2016-11-30 MED ORDER — GABAPENTIN 300 MG PO CAPS: 300.0000 mg | ORAL_CAPSULE | Freq: Every day | ORAL | 4 refills | Status: DC

## 2016-11-30 NOTE — Progress Notes (Signed)
[facility]  Telephone:([phone]) [phone] Fax:([phone]) [phone]     ID: [name] DOB: 05-13-1951  MR#: [id number]  NID#:[id number]  Patient Care Team: Lucille Passy, MD as PCP - General Coralie Keens, MD as Consulting Physician (General Surgery) Chauncey Cruel, MD as Consulting Physician (Oncology) Eppie Gibson, MD as Attending Physician (Radiation Oncology) Chauncey Cruel, MD OTHER MD:  CHIEF COMPLAINT: Estrogen receptor positive breast cancer  CURRENT TREATMENT: Awaiting adjuvant radiation   BREAST CANCER HISTORY: From the original intake note:  Ms Arciniega (pronounced "le-veen") reports noticing a mass in her left breast for about a year but did not think to bring this to medical attention, thinking it might be a cyst. The mass was picked up on routine physical exam by Dr. Deborra Medina who set up the patient for bilateral screening mammography at the [facility] 08/10/2016. This showed the breast density to be category C. A possible mass was noted in the left breast and on 08/18/2016 she was recalled for left diagnostic mammography with tomography and left breast ultrasonography. This confirmed a 2.9 cm spiculated mass in the upper outer quadrant of the left breast. This was palpable at the 1:00 position 8 cm from the nipple. Ultrasonography found an irregular hypoechoic mass at this site measuring 3.5 cm. The left axilla was sonographically benign.  The patient was then set up for ultrasound-guided biopsy of the left breast mass 08/20/2016. At the time of this ultrasound multiple solid subcentimeter satellite nodules were noted in the upper-outer quadrant of the left breast and one of these was biopsied as well as the main mass. The distance between the clips is 5.0 cm. 4 The pathology from that procedure ([id number]) both showed invasive ductal carcinoma, grade 2, with similar prognostic panels. Specifically the estrogen receptor was 95% and the progesterone receptor 100% for  both biopsies, both with strong staining intensity. The MIB-1 was between 10 and 15%. Both tumors were HER-2 negative, with a signals ratio between 0.78 and 1.06 and the number per cell between 1.45 and 1.80  The patient's subsequent history is as detailed below thank you  INTERVAL HISTORY: Karys returns today for follow-up of her estrogen receptor positive breast cancer. Since the last visit here she underwent left lumpectomy and sentinel lymph node sampling. This found 2 foci of invasive ductal carcinoma, grade 2, measuring 3.0 and 0.6 cm. There was also intermediate grade ductal carcinoma in situ. The invasive carcinoma was focally 0.1 cm to the posterior margin. The single sentinel lymph node was clear.  An Oncotype DX was obtained with a score of 25 predicting a 10 year risk of recurrence outside the breast of 17% if the patient's only systemic therapy is tamoxifen for 5 years. It also predicts a benefit of additional chemotherapy of about 4% in terms of risk reduction.  Her case was presented in the multidisciplinary breast cancer conference 11/24/2016. At that time there was discussion of and earlier lesion which was initially felt to be suspicious but later was downgraded to probably benign site was never biopsied. There was also a close posterior margin. Because of this additional imaging was suggested and she is scheduled for breast MRI 12/01/2016   REVIEW OF SYSTEMS: She had some bleeding after the surgery when she got home. She took off the binder a little bit too soon. She never had fever or any further bleeding but she feels pretty bruised and is still a little bit on the sore side. Her breast is still swollen  and she wonders if she is going to have to have some fluid removed.Aside from this a detailed review of systems today was noncontributory.  PAST MEDICAL HISTORY: Past Medical History:  Diagnosis Date  . Anxiety   . Depression   . Dermatitis    allergic  . Foot pain, right     . Malignant neoplasm of upper-outer quadrant of left breast in female, estrogen receptor positive ([place]) 08/31/2016  . Need for prophylactic hormone replacement therapy (postmenopausal)   . Nocturia   . Routine general medical examination at a health care facility   . Symptomatic menopausal or female climacteric states   . Thoracic or lumbosacral neuritis or radiculitis, unspecified     PAST SURGICAL HISTORY: Past Surgical History:  Procedure Laterality Date  . BREAST LUMPECTOMY WITH SENTINEL LYMPH NODE BIOPSY Left 11/09/2016   Procedure: LEFT BREAST LUMPECTOMY WITH SENTINEL LYMPH NODE BX;  Surgeon: Coralie Keens, MD;  Location: [facility];  Service: General;  Laterality: Left;  . TONSILLECTOMY     age 58    FAMILY HISTORY No family history on file.  The patient's father died from lung cancer in the setting of tobacco. Patient's mother died at age 92 with Alzheimer's is the patient has no brothers, 2 sisters. There is no history of breast or ovarian cancer in the family   GYNECOLOGIC HISTORY:  No LMP recorded. Patient is postmenopausal. Menarche age 60, the patient is GX P0. She stopped having periods approximately 2013. She has been on oral contraceptives more than 5 years, stopping in mid-November 2017.  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 Her husband Clair Gulling is self-employed in [employer] and runs a grown [employer]. At home is just the 2 of them plus a dog and 3 cats.    ADVANCED DIRECTIVES: Not in place   HEALTH MAINTENANCE: Social History  Substance Use Topics  . Smoking status: Former Research scientist (life sciences)  . Smokeless tobacco: Never Used     Comment: smoked years ago in her 20's socially  . Alcohol use Yes     Comment: social     Colonoscopy:Remote  PAP:  Bone density: Never   No Known Allergies  Current Outpatient Prescriptions  Medication Sig Dispense Refill  . anastrozole  (ARIMIDEX) 1 MG tablet Take 1 tablet (1 mg total) by mouth daily. 90 tablet 4  . escitalopram (LEXAPRO) 20 MG tablet TAKE 1 AND 1/2 TABLETS DAILY 135 tablet 2  . HYDROcodone-acetaminophen (NORCO/VICODIN) 5-325 MG tablet Take 1-2 tablets by mouth every 4 (four) hours as needed for moderate pain or severe pain. 40 tablet 0  . zolpidem (AMBIEN) 5 MG tablet Take 1 tablet (5 mg total) by mouth at bedtime as needed for sleep. 15 tablet 0   No current facility-administered medications for this visit.     OBJECTIVE: middle-aged white woman in no acute distress  Vitals:   11/30/16 0934  BP: 132/87  Pulse: 79  Resp: 17  Temp: 98.2 F (36.8 C)     Body mass index is 21.03 kg/m.    ECOG FS:1 - Symptomatic but completely ambulatory Filed Weights   11/30/16 0934  Weight: 118 lb 11.2 oz (53.8 kg)   Sclerae unicteric, pupils round and equal Oropharynx clear and moist-- no thrush or other lesions No cervical or supraclavicular adenopathy Lungs no rales or rhonchi Heart regular rate and rhythm Abd soft, nontender, positive bowel sounds MSK no focal  spinal tenderness, no upper extremity lymphedema Neuro: nonfocal, well oriented, appropriate affect Breasts: The right breast is benign. Left breast is status post recent lumpectomy. It is approximately 10-15% larger than the contralateral breast because of swelling. There is also some bruising. The left axilla is benign.  LAB RESULTS:  CMP     Component Value Date/Time   NA 139 09/10/2016 1339   K 4.0 09/10/2016 1339   CL 108 08/03/2016 0822   CO2 26 09/10/2016 1339   GLUCOSE 124 09/10/2016 1339   BUN 21.8 09/10/2016 1339   CREATININE 0.8 09/10/2016 1339   CALCIUM 9.2 09/10/2016 1339   PROT 6.8 09/10/2016 1339   ALBUMIN 3.9 09/10/2016 1339   AST 16 09/10/2016 1339   ALT 16 09/10/2016 1339   ALKPHOS 51 09/10/2016 1339   BILITOT 0.51 09/10/2016 1339   GFRNONAA >90 02/14/2013 0658   GFRAA >90 02/14/2013 0658    INo results found for:  SPEP, UPEP  Lab Results  Component Value Date   WBC 5.8 09/10/2016   NEUTROABS 3.5 09/10/2016   HGB 13.8 09/10/2016   HCT 41.9 09/10/2016   MCV 97.4 09/10/2016   PLT 210 09/10/2016      Chemistry      Component Value Date/Time   NA 139 09/10/2016 1339   K 4.0 09/10/2016 1339   CL 108 08/03/2016 0822   CO2 26 09/10/2016 1339   BUN 21.8 09/10/2016 1339   CREATININE 0.8 09/10/2016 1339      Component Value Date/Time   CALCIUM 9.2 09/10/2016 1339   ALKPHOS 51 09/10/2016 1339   AST 16 09/10/2016 1339   ALT 16 09/10/2016 1339   BILITOT 0.51 09/10/2016 1339       No results found for: LABCA2  No components found for: LABCA125  No results for input(s): INR in the last 168 hours.  Urinalysis    Component Value Date/Time   COLORURINE YELLOW 02/14/2013 0703   APPEARANCEUR CLOUDY (A) 02/14/2013 0703   LABSPEC 1.018 02/14/2013 0703   PHURINE 8.0 02/14/2013 0703   GLUCOSEU NEGATIVE 02/14/2013 0703   HGBUR NEGATIVE 02/14/2013 0703   BILIRUBINUR NEGATIVE 02/14/2013 0703   KETONESUR 15 (A) 02/14/2013 0703   PROTEINUR NEGATIVE 02/14/2013 0703   UROBILINOGEN 0.2 02/14/2013 0703   NITRITE NEGATIVE 02/14/2013 0703   LEUKOCYTESUR NEGATIVE 02/14/2013 0703     STUDIES: Nm Sentinel Node Inj-no Rpt (breast)  Result Date: 11/26/2016 There is no Radiologist interpretation  for this exam.   ELIGIBLE FOR AVAILABLE RESEARCH PROTOCOL: no  ASSESSMENT: 66 y.o. [place] woman status post left breast upper outer quadrant biopsy x2 on 08/20/2016 for a clinical mT2 N0, stage IIA invasive ductal carcinoma, both tumors showing identical prognostic panels: Estrogen and progesterone receptor strongly positive, HER-2 negative, with an MIB-1 of 10-15%   (1) neoadjuvant anastrozole started 09/10/2016 in anticipation possible surgical delays  (2) status post left lumpectomy and sentinel lymph node sampling 01/30/2018n for a mpT2 pN0, stage IB (2018 classification) invasive ductal carcinoma,  with negative margins.  (3) Oncotype DX score of 25 predicts a risk of outside the breast recurrence of 17% if the patient's only systemic therapy is tamoxifen for 5 years. It predicts a risk reduction of approximately an additional 4% if chemotherapy is added.  (a) the patient opted against adjuvant chemotherapy  (4) adjuvant radiation to follow   (5) continue on anti-estrogens a minimum of 5 years  PLAN: I spent approximately 30 minutes with Cherrill with most of that time spent discussing  her complex problems. We reviewed her pathology report and she understands that although the deep margin is close is still negative.Nevertheless, it is prudent to do a little bit more imaging of her breast as suggested at the 11/24/2016 conference and thisis scheduled for 12/01/2016. I do not anticipate any significant change from that study  We reviewed her Oncotype score and of course it falls just in the midpoint of the intermediate group.I quoted her a risk of recurrence of 17% within 10 years if her only systemic treatment is tamoxifen for 5 years. She understands we are talking about outside the breast recurrence and therefore stage IV disease. On the positive side this would "her and 83% chance of this not occurring within the next 10 years, without chemotherapy.  If in addition she took chemotherapy, the risk would fall by approximately 3-5%. We discussed standard chemotherapy in this setting which is cyclophosphamide and doxorubicin followed by paclitaxel and we discussed the possible toxicities, side effects and complications of these agents.  After much discussion she was very clear that the small decrease in relapse was not motivating her to undergo chemotherapy. Once became clear we discussed the fact that she is actually taking anastrozole rather than tamoxifen and that 5 years of anastrozole will reduce her risk of recurrence by about 2-3 percent beyond what is predicted and the Oncotype report. This  reinforced her decision.  Accordingly the plan is to proceed to radiation and I have made an appointment for her with Dr. Lanell Persons in 2 weeks or so. Miracle still has some discomfort and swelling in the breast which will have to resolve a little further before she can undergo radiation treatments. In the meantime we are continuing on anastrozole and I will schedule her for a bone density study before she returns to see me in May.  She knows to call for any problems that may develop before her next visit here.     :Chauncey Cruel, MD   11/30/2016 9:50 AM Medical Oncology and Hematology [facility] [place],  [address] Tel. [phone]    Fax. [phone]

## 2016-11-30 NOTE — Telephone Encounter (Signed)
lvm to inform pt of bone density appt 2/28 at 2 pm at [facility] per LOS

## 2016-12-01 ENCOUNTER — Ambulatory Visit
Admission: RE | Admit: 2016-12-01 | Discharge: 2016-12-01 | Disposition: A | Discharge status: Home / Self Care | Payer: BC Managed Care – PPO | Source: Ambulatory Visit | Attending: Surgery | Admitting: Surgery

## 2016-12-01 DIAGNOSIS — C50912 Malignant neoplasm of unspecified site of left female breast: Secondary | ICD-10-CM

## 2016-12-01 HISTORY — DX: Malignant neoplasm of unspecified site of unspecified female breast: C50.919

## 2016-12-02 ENCOUNTER — Encounter: Payer: Self-pay | Admitting: Radiation Oncology

## 2016-12-08 ENCOUNTER — Ambulatory Visit
Admission: RE | Admit: 2016-12-08 | Discharge: 2016-12-08 | Disposition: A | Discharge status: Home / Self Care | Payer: BC Managed Care – PPO | Source: Ambulatory Visit | Attending: Oncology | Admitting: Oncology

## 2016-12-08 DIAGNOSIS — Z170 Estrogen receptor positive status [ER+]: Principal | ICD-10-CM

## 2016-12-08 DIAGNOSIS — C50412 Malignant neoplasm of upper-outer quadrant of left female breast: Secondary | ICD-10-CM

## 2016-12-08 NOTE — Progress Notes (Signed)
Location of Breast Cancer: Left Breast  Histology per Pathology Report:  08/20/16 Diagnosis 1. Breast, left, needle core biopsy, 2 o'clock palpable 8cmfn - INVASIVE DUCTAL CARCINOMA. - DUCTAL CARCINOMA IN SITU WITH NECROSIS. - SEE COMMENT  Receptor Status: ER (95%), PR (100%), Ki- (15%) Her2-neu (NEG)  2. Breast, left, needle core biopsy, 2 o'clock 5 cmfn - INVASIVE DUCTAL CARCINOMA.  Receptor Status: ER(95%), PR (100%), Her2-neu (NEG), Ki-(10%)  09/28/16 Diagnosis Breast, left, needle core biopsy, lower outer quadrant at anterior depth - BENIGN BREAST TISSUE WITH EXTENSIVE HEMORRHAGE. - NO MALIGNANCY IDENTIFIED.  11/09/16 Diagnosis 1. Breast, lumpectomy, Left - INVASIVE DUCTAL CARCINOMA WITH CALCIFICATIONS, GRADE II/III, TWO FOCI, SPANNING 3.0 CM AND 0.6 CM. - DUCTAL CARCINOMA IN SITU WITH CALCIFICATIONS, INTERMEDIATE GRADE. - LYMPHOVASCULAR INVASION IS IDENTIFIED. - INVASIVE DUCTAL CARCINOMA IS FOCALLY 0.1 CM TO THE POSTERIOR MARGIN. - SEE ONCOLOGY TABLE BELOW. 2. Lymph node, sentinel, biopsy, Left axillary - THERE IS NO EVIDENCE OF CARCINOMA IN 1 OF 1 LYMPH NODE (0/1).  Did patient present with symptoms or was this found on screening mammography?: It was found on a screening mammogram. The patient noted however that she had felt a mass in her Left Breast for almost a year.   Past/Anticipated interventions by surgeon, if any: 11/09/16 Procedure(s): LEFT BREAST LUMPECTOMY WITH SENTINEL LYMPH NODE BX DEEP LEFT AXILLARY LYMPH NODE INJECTION OF BLUE DYE Surgeon(s): Coralie Keens, MD  She saw Dr. Ninfa Linden 2 weeks ago and her surgery site was drained. She will see him again today for assessment and possible drainage.  Past/Anticipated interventions by medical oncology, if any:  11/30/16 Dr. Jana Hakim (1) neoadjuvant anastrozole started 09/10/2016 in anticipation possible surgical delays  (2) status post left lumpectomy and sentinel lymph node sampling  01/30/2018n for a mpT2 pN0, stage IB (2018 classification) invasive ductal carcinoma, with negative margins.  (3) Oncotype DX score of 25 predicts a risk of outside the breast recurrence of 17% if the patient's only systemic therapy is tamoxifen for 5 years. It predicts a risk reduction of approximately an additional 4% if chemotherapy is added.             (a) the patient opted against adjuvant chemotherapy  (4) adjuvant radiation to follow   (5) continue on anti-estrogens a minimum of 5 years   Lymphedema issues, if any: She denies  Pain issues, if any: She denies  SAFETY ISSUES:  Prior radiation? No  Pacemaker/ICD? No  Possible current pregnancy? N/A  Is the patient on methotrexate? No  Current Complaints / other details:  She has travel plans for the week of April 15th. She has paid for this trip and is unable to change her plans at this time.   BP 129/83   Pulse 63   Temp 98 F (36.7 C)   Ht 5' 3"  (1.6 m)   Wt 119 lb 9.6 oz (54.3 kg)   SpO2 100% Comment: room air  BMI 21.19 kg/m    Wt Readings from Last 3 Encounters:  12/14/16 119 lb 9.6 oz (54.3 kg)  11/30/16 118 lb 11.2 oz (53.8 kg)  11/09/16 117 lb (53.1 kg)

## 2016-12-14 ENCOUNTER — Encounter: Payer: Self-pay | Admitting: Radiation Oncology

## 2016-12-14 ENCOUNTER — Ambulatory Visit
Admission: RE | Admit: 2016-12-14 | Discharge: 2016-12-14 | Disposition: A | Discharge status: Home / Self Care | Payer: BC Managed Care – PPO | Source: Ambulatory Visit | Attending: Radiation Oncology | Admitting: Radiation Oncology

## 2016-12-14 VITALS — BP 129/83 | HR 63 | Temp 98.0°F | Ht 63.0 in | Wt 119.6 lb

## 2016-12-14 DIAGNOSIS — C50412 Malignant neoplasm of upper-outer quadrant of left female breast: Secondary | ICD-10-CM | POA: Insufficient documentation

## 2016-12-14 DIAGNOSIS — Z170 Estrogen receptor positive status [ER+]: Secondary | ICD-10-CM | POA: Diagnosis not present

## 2016-12-14 DIAGNOSIS — Z87891 Personal history of nicotine dependence: Secondary | ICD-10-CM | POA: Diagnosis not present

## 2016-12-14 DIAGNOSIS — Z79899 Other long term (current) drug therapy: Secondary | ICD-10-CM | POA: Diagnosis not present

## 2016-12-14 NOTE — Progress Notes (Signed)
Radiation Oncology         [phone] ________________________________  Name: Kathy Bishop MRN: [id number]  Date: 12/14/2016  DOB: [birth year]  Follow-Up Visit Note  Outpatient  CC: Arnette Norris, MD  Coralie Keens, MD  Diagnosis:      ICD-9-CM ICD-10-CM   1. Malignant neoplasm of upper-outer quadrant of left breast in female, estrogen receptor positive ([place]) 174.4 C50.412    V86.0 Z17.0    Stage II, T2N0M0 UOQ Invasive Ductal Carcinoma of the Left Breast , Pathologic Multifocal T2N0  ER+ / PR+ / Her2neg, Grade 2  CHIEF COMPLAINT: Here to discuss management of left breast cancer  Narrative:  The patient returns today for follow-up.     Since consultation, she underwent lumpectomy and SLN bx by Dr Ninfa Linden on 11-09-16.  Two tumors were removed, spanning 3.0 cm and 0.6cm.  This was DCIS as well, and LVSI.  The margins are focally 0.1cm to the post margin for invasive disease. Negative to DCIS.  The SLN was negative  She sees Dr. Ninfa Linden today.  She had fluid aspirated post operatively from her seroma.  He will check it again today. It's still swollen but significantly better.  No drainage.  She had an oncotype score of 25, opted out of chemotherapy.  Trip to [place] April 15-22.           ALLERGIES:  has No Known Allergies.  Meds: Current Outpatient Prescriptions  Medication Sig Dispense Refill  . anastrozole (ARIMIDEX) 1 MG tablet Take 1 tablet (1 mg total) by mouth daily. 90 tablet 4  . escitalopram (LEXAPRO) 20 MG tablet TAKE 1 AND 1/2 TABLETS DAILY 135 tablet 2  . gabapentin (NEURONTIN) 300 MG capsule Take 1 capsule (300 mg total) by mouth at bedtime. 90 capsule 4  . HYDROcodone-acetaminophen (NORCO/VICODIN) 5-325 MG tablet Take 1-2 tablets by mouth every 4 (four) hours as needed for moderate pain or severe pain. (Patient not taking: Reported on 12/14/2016) 40 tablet 0  . zolpidem (AMBIEN) 5 MG tablet Take 1 tablet (5 mg total) by mouth at bedtime as needed for sleep.  (Patient not taking: Reported on 12/14/2016) 15 tablet 0   No current facility-administered medications for this encounter.     Physical Findings:  height is 5\' 3"  (1.6 m) and weight is 119 lb 9.6 oz (54.3 kg). Her temperature is 98 F (36.7 C). Her blood pressure is 129/83 and her pulse is 63. Her oxygen saturation is 100%. .     General: Alert and oriented, in no acute distress Musculoskeletal: symmetric strength and muscle tone throughout in arms. Neurologic: No obvious focalities. Speech is fluent. Numb in UOQ left breast  Psychiatric: Judgment and insight are intact. Affect is appropriate. Breast exam reveals seroma, no drainage or sign of infection, in UOQ left breast. Scars healed.  Lab Findings: Lab Results  Component Value Date   WBC 5.8 09/10/2016   HGB 13.8 09/10/2016   HCT 41.9 09/10/2016   MCV 97.4 09/10/2016   PLT 210 09/10/2016    Radiographic Findings: Dg Bone Density  Result Date: 12/08/2016 EXAM: DUAL X-RAY ABSORPTIOMETRY (DXA) FOR BONE MINERAL DENSITY IMPRESSION: Referring Physician:  Lurline Del PATIENT: Name: Kathy Bishop Patient ID: [id number] Birth Date: [birth year] Height: 63.5 in. Sex: Female Measured: 12/08/2016 Weight: 119.2 lbs. Indications: Anastrazole, Breast Cancer History, Caucasian, Estrogen Deficient, Postmenopausal Fractures: None Treatments: Hormone Therapy For Cancer, Vitamin D (E933.5) ASSESSMENT: The BMD measured at Femur Neck Left is 1.017 g/cm2 with a T-score of -0.2. This  patient is considered normal according to [facility]) criteria. There has been a statistically significant increase in BMD of Lumbar spine, and no statistically significant change in BMD of Left hip since prior exam dated 06/25/2009. Site Region Measured Date Measured Age YA BMD Significant CHANGE T-score DualFemur Neck Left 12/08/2016    66         -0.2    1.017 g/cm2 AP Spine  L1-L4     12/08/2016    66         1.3     1.350 g/cm2 * World Health  Organization [facility]) criteria for post-menopausal, Caucasian Women: Normal       T-score at or above -1 SD Osteopenia   T-score between -1 and -2.5 SD Osteoporosis T-score at or below -2.5 SD RECOMMENDATION: [place] recommends that FDA-approved medical therapies be considered in postmenopausal women and men age 29 or older with a: 1. Hip or vertebral (clinical or morphometric) fracture. 2. T-score of <-2.5 at the spine or hip. 3. Ten-year fracture probability by FRAX of 3% or greater for hip fracture or 20% or greater for major osteoporotic fracture. All treatment decisions require clinical judgment and consideration of individual patient factors, including patient preferences, co-morbidities, previous drug use, risk factors not captured in the FRAX model (e.g. falls, vitamin D deficiency, increased bone turnover, interval significant decline in bone density) and possible under - or over-estimation of fracture risk by FRAX. All patients should ensure an adequate intake of dietary calcium (1200 mg/d) and vitamin D (800 IU daily) unless contraindicated. FOLLOW-UP: People with diagnosed cases of osteoporosis or at high risk for fracture should have regular bone mineral density tests. For patients eligible for Medicare, routine testing is allowed once every 2 years. The testing frequency can be increased to one year for patients who have rapidly progressing disease, those who are receiving or discontinuing medical therapy to restore bone mass, or have additional risk factors. I have reviewed this report, and agree with the above findings. [facility] Radiology Electronically Signed   By: Lahoma Crocker M.D.   On: 12/08/2016 14:32   Mm Diag Breast Tomo Uni Left  Result Date: 12/01/2016 CLINICAL DATA:  Status post left lumpectomy 3 weeks ago for multifocal invasive ductal carcinoma. Post lumpectomy mammogram is being obtained to ensure that the clips corresponding to the sites of invasive carcinoma  were removed during the lumpectomy. EXAM: 2D DIGITAL DIAGNOSTIC UNILATERAL LEFT MAMMOGRAM WITH CAD AND ADJUNCT TOMO COMPARISON:  Previous exams including diagnostic post clip film dated 09/28/2016 ACR Breast Density Category c: The breast tissue is heterogeneously dense, which may obscure small masses. FINDINGS: Expected postsurgical changes, with associated seroma, are seen within the upper-outer quadrant of the left breast. Both the ribbon shaped clip and coil shaped clip are no longer present, presumably removed during the lumpectomy. Mammographic images were processed with CAD. IMPRESSION: Expected postsurgical changes, with associated seroma, within the left breast status post recent lumpectomy. Ribbon shaped clip and coil shaped clip, corresponding to the sites of biopsy proven carcinoma, are no longer present. RECOMMENDATION: 1.  Per current treatment plan. 2.  Bilateral diagnostic mammogram in 1 year. I have discussed the findings and recommendations with the patient. Results were also provided in writing at the conclusion of the visit. If applicable, a reminder letter will be sent to the patient regarding the next appointment. BI-RADS CATEGORY  2: Benign. Electronically Signed   By: Franki Cabot M.D.   On: 12/01/2016 10:26  Impression/Plan: Left breast cancer We discussed adjuvant radiotherapy today.  I recommend adjuvant RT to the left breast in order to  Reduce risk of locoregional recurrence by 2/3.  The risks, benefits and side effects of this treatment were discussed in detail.  She understands that radiotherapy is associated with skin irritation and fatigue in the acute setting. Late effects can include cosmetic changes and rare injury to internal organs.   She is enthusiastic about proceeding with treatment. A consent form has been  signed and placed in her chart.  A total of 3 medically necessary complex treatment devices will be fabricated and supervised by me: 2 fields with MLCs for  custom blocks to protect heart, and lungs;  and, a Vac-lok. MORE COMPLEX DEVICES MAY BE MADE IN DOSIMETRY FOR FIELD IN FIELD BEAMS FOR DOSE HOMOGENEITY.  I have requested : 3D Simulation which is medically necessary to give adequate dose to at risk tissues while sparing lungs and heart.  I have requested a DVH of the following structures: lungs, heart, left lumpectomy cavity.    The patient will receive 40.05 Gy in 15 fractions to the left breast with at least 2 fields.  This will be followed by a boost.  Simulation scheduled for tomorrow.  Start RT March 12 to allow her to leave for her trip in April.  I spent 30 minutes face to face with the patient and more than 50% of that time was spent in counseling and/or coordination of care. _____________________________________   Eppie Gibson, MD

## 2016-12-15 ENCOUNTER — Ambulatory Visit
Admission: RE | Admit: 2016-12-15 | Discharge: 2016-12-15 | Disposition: A | Discharge status: Home / Self Care | Payer: BC Managed Care – PPO | Source: Ambulatory Visit | Attending: Radiation Oncology | Admitting: Radiation Oncology

## 2016-12-15 DIAGNOSIS — C50412 Malignant neoplasm of upper-outer quadrant of left female breast: Secondary | ICD-10-CM

## 2016-12-15 DIAGNOSIS — Z170 Estrogen receptor positive status [ER+]: Principal | ICD-10-CM

## 2016-12-15 NOTE — Progress Notes (Signed)
Radiation Oncology         (336) [phone] ________________________________  Name: Kathy Bishop MRN: [id number]  Date: 12/15/2016  DOB: 10/11/1951  SIMULATION AND TREATMENT PLANNING NOTE Special treatment procedure  Outpatient  DIAGNOSIS:     ICD-9-CM ICD-10-CM   1. Malignant neoplasm of upper-outer quadrant of left breast in female, estrogen receptor positive ([place]) 174.4 C50.412    V86.0 Z17.0     NARRATIVE:  The patient was brought to the [facility].  Identity was confirmed.  All relevant records and images related to the planned course of therapy were reviewed.  The patient freely provided informed written consent to proceed with treatment after reviewing the details related to the planned course of therapy. The consent form was witnessed and verified by the simulation staff.    Then, the patient was set-up in a stable reproducible supine position for radiation therapy with her ipsilateral arm over her head, and her upper body secured in a custom-made Vac-lok device.  CT images were obtained.  Surface markings were placed.  The CT images were loaded into the planning software.    Special treatment procedure:  Special treatment procedure was performed today due to the extra time and effort required by myself to plan and prepare this patient for deep inspiration breath hold technique.  I have determined cardiac sparing to be of benefit to this patient to prevent long term cardiac damage due to radiation of the heart.  Bellows were placed on the patient's abdomen. To facilitate cardiac sparing, the patient was coached by the radiation therapists on breath hold techniques and breathing practice was performed. Practice waveforms were obtained. The patient was then scanned while maintaining breath hold in the treatment position.  This image was then transferred over to the imaging specialist. The imaging specialist then created a fusion of the free breathing and breath hold scans using  the chest wall as the stable structure. I personally reviewed the fusion in axial, coronal and sagittal image planes.  Excellent cardiac sparing was obtained.  I felt the patient is an appropriate candidate for breath hold and the patient will be treated as such.  The image fusion was then reviewed with the patient to reinforce the necessity of reproducible breath hold.  TREATMENT PLANNING NOTE: Treatment planning then occurred.  The radiation prescription was entered and confirmed.     A total of 3 medically necessary complex treatment devices were fabricated and supervised by me: 2 fields with MLCs for custom blocks to protect heart, and lungs;  and, a Vac-lok. MORE COMPLEX DEVICES MAY BE MADE IN DOSIMETRY FOR FIELD IN FIELD BEAMS FOR DOSE HOMOGENEITY.  I have requested : 3D Simulation which is medically necessary to give adequate dose to at risk tissues while sparing lungs and heart.  I have requested a DVH of the following structures: lungs, heart, lumpectomy cavity.    The patient will receive 40.05 Gy in 15 fractions to the left breast with 2 tangential fields.  This will be followed by a boost.  Optical Surface Tracking Plan:  Since intensity modulated radiotherapy (IMRT) and 3D conformal radiation treatment methods are predicated on accurate and precise positioning for treatment, intrafraction motion monitoring is medically necessary to ensure accurate and safe treatment delivery. The ability to quantify intrafraction motion without excessive ionizing radiation dose can only be performed with optical surface tracking. Accordingly, surface imaging offers the opportunity to obtain 3D measurements of patient position throughout IMRT and 3D treatments without excessive radiation  exposure. I am ordering optical surface tracking for this patient's upcoming course of radiotherapy.  ________________________________   Reference:  Ursula Alert, J, et al. Surface imaging-based analysis of  intrafraction motion for breast radiotherapy patients.Journal of [place], n. 6, nov. 2014. ISSN [id number].  Available at: <http://www.jacmp.org/index.php/jacmp/article/view/4957>.    -----------------------------------  Eppie Gibson, MD

## 2016-12-17 DIAGNOSIS — C50412 Malignant neoplasm of upper-outer quadrant of left female breast: Secondary | ICD-10-CM | POA: Diagnosis not present

## 2016-12-20 ENCOUNTER — Encounter: Payer: Self-pay | Admitting: Radiation Oncology

## 2016-12-20 ENCOUNTER — Inpatient Hospital Stay
Admission: RE | Admit: 2016-12-20 | Discharge: 2016-12-20 | Disposition: A | Discharge status: Home / Self Care | Payer: Self-pay | Source: Ambulatory Visit | Attending: Radiation Oncology | Admitting: Radiation Oncology

## 2016-12-20 ENCOUNTER — Ambulatory Visit
Admission: RE | Admit: 2016-12-20 | Discharge: 2016-12-20 | Disposition: A | Discharge status: Home / Self Care | Payer: BC Managed Care – PPO | Source: Ambulatory Visit | Attending: Radiation Oncology | Admitting: Radiation Oncology

## 2016-12-20 VITALS — BP 132/84 | HR 56 | Temp 99.3°F | Ht 63.0 in | Wt 118.8 lb

## 2016-12-20 DIAGNOSIS — C50412 Malignant neoplasm of upper-outer quadrant of left female breast: Secondary | ICD-10-CM | POA: Diagnosis not present

## 2016-12-20 DIAGNOSIS — Z170 Estrogen receptor positive status [ER+]: Principal | ICD-10-CM

## 2016-12-20 MED ORDER — RADIAPLEXRX EX GEL
Freq: Once | CUTANEOUS | Status: AC
  Administered 2016-12-20: 17:00:00 via TOPICAL

## 2016-12-20 MED ORDER — ALRA NON-METALLIC DEODORANT (RAD-ONC)
1.0000 "application " | Freq: Once | TOPICAL | Status: AC
  Administered 2016-12-20: 1 via TOPICAL

## 2016-12-20 NOTE — Progress Notes (Signed)

## 2016-12-20 NOTE — Progress Notes (Signed)
Kathy Bishop presents after her first fraction of radiation to her Left Breast. She denies or fatigue. She was provided education today and Radiaplex cream. She will begin using the Radiaplex twice daily.  BP 132/84   Pulse (!) 56   Temp 99.3 F (37.4 C)   Ht 5\' 3"  (1.6 m)   Wt 118 lb 12.8 oz (53.9 kg)   SpO2 98%   BMI 21.04 kg/m    Wt Readings from Last 3 Encounters:  12/20/16 118 lb 12.8 oz (53.9 kg)  12/14/16 119 lb 9.6 oz (54.3 kg)  11/30/16 118 lb 11.2 oz (53.8 kg)

## 2016-12-20 NOTE — Progress Notes (Signed)
   Weekly Management Note:  Outpatient    ICD-9-CM ICD-10-CM   1. Malignant neoplasm of upper-outer quadrant of left breast in female, estrogen receptor positive ([facility]) 174.4 C50.412    V86.0 Z17.0     Current Dose:  2.67 Gy  Projected Dose: 50.05 Gy   Narrative:  The patient presents for routine under treatment assessment.  CBCT/MVCT images/Port film x-rays were reviewed.  The chart was checked. Doing well.  Physical Findings:  height is 5\' 3"  (1.6 m) and weight is 118 lb 12.8 oz (53.9 kg). Her temperature is 99.3 F (37.4 C). Her blood pressure is 132/84 and her pulse is 56 (abnormal). Her oxygen saturation is 98%.   Wt Readings from Last 3 Encounters:  12/20/16 118 lb 12.8 oz (53.9 kg)  12/14/16 119 lb 9.6 oz (54.3 kg)  11/30/16 118 lb 11.2 oz (53.8 kg)   NAD, no skin irritation over left breast  Impression:  The patient is tolerating radiotherapy.  Plan:  Continue radiotherapy as planned. Patient instructed to apply Radiplex to intact skin in treatment fields.      ________________________________   Eppie Gibson, M.D.

## 2016-12-21 ENCOUNTER — Ambulatory Visit
Admission: RE | Admit: 2016-12-21 | Discharge: 2016-12-21 | Disposition: A | Discharge status: Home / Self Care | Payer: BC Managed Care – PPO | Source: Ambulatory Visit | Attending: Radiation Oncology | Admitting: Radiation Oncology

## 2016-12-21 DIAGNOSIS — C50412 Malignant neoplasm of upper-outer quadrant of left female breast: Secondary | ICD-10-CM | POA: Diagnosis not present

## 2016-12-22 ENCOUNTER — Ambulatory Visit
Admission: RE | Admit: 2016-12-22 | Discharge: 2016-12-22 | Disposition: A | Discharge status: Home / Self Care | Payer: BC Managed Care – PPO | Source: Ambulatory Visit | Attending: Radiation Oncology | Admitting: Radiation Oncology

## 2016-12-22 DIAGNOSIS — C50412 Malignant neoplasm of upper-outer quadrant of left female breast: Secondary | ICD-10-CM | POA: Diagnosis not present

## 2016-12-23 ENCOUNTER — Ambulatory Visit
Admission: RE | Admit: 2016-12-23 | Discharge: 2016-12-23 | Disposition: A | Discharge status: Home / Self Care | Payer: BC Managed Care – PPO | Source: Ambulatory Visit | Attending: Radiation Oncology | Admitting: Radiation Oncology

## 2016-12-23 DIAGNOSIS — C50412 Malignant neoplasm of upper-outer quadrant of left female breast: Secondary | ICD-10-CM | POA: Diagnosis not present

## 2016-12-24 ENCOUNTER — Ambulatory Visit
Admission: RE | Admit: 2016-12-24 | Discharge: 2016-12-24 | Disposition: A | Discharge status: Home / Self Care | Payer: BC Managed Care – PPO | Source: Ambulatory Visit | Attending: Radiation Oncology | Admitting: Radiation Oncology

## 2016-12-24 DIAGNOSIS — C50412 Malignant neoplasm of upper-outer quadrant of left female breast: Secondary | ICD-10-CM | POA: Diagnosis not present

## 2016-12-27 ENCOUNTER — Encounter: Payer: Self-pay | Admitting: Radiation Oncology

## 2016-12-27 ENCOUNTER — Ambulatory Visit
Admission: RE | Admit: 2016-12-27 | Discharge: 2016-12-27 | Disposition: A | Discharge status: Home / Self Care | Payer: BC Managed Care – PPO | Source: Ambulatory Visit | Attending: Radiation Oncology | Admitting: Radiation Oncology

## 2016-12-27 VITALS — BP 119/76 | HR 61 | Temp 98.3°F | Ht 63.0 in | Wt 119.8 lb

## 2016-12-27 DIAGNOSIS — Z170 Estrogen receptor positive status [ER+]: Principal | ICD-10-CM

## 2016-12-27 DIAGNOSIS — C50412 Malignant neoplasm of upper-outer quadrant of left female breast: Secondary | ICD-10-CM | POA: Diagnosis not present

## 2016-12-27 NOTE — Progress Notes (Signed)
   Weekly Management Note:  Outpatient    ICD-9-CM ICD-10-CM   1. Malignant neoplasm of upper-outer quadrant of left breast in female, estrogen receptor positive ([facility]) 174.4 C50.412    V86.0 Z17.0     Current Dose: 16.02 Gy  Projected Dose: 50.05 Gy   Narrative:  The patient presents for routine under treatment assessment.  CBCT/MVCT images/Port film x-rays were reviewed.  The chart was checked. Doing well.  Physical Findings:  height is 5\' 3"  (1.6 m) and weight is 119 lb 12.8 oz (54.3 kg). Her temperature is 98.3 F (36.8 C). Her blood pressure is 119/76 and her pulse is 61. Her oxygen saturation is 99%.   Wt Readings from Last 3 Encounters:  12/27/16 119 lb 12.8 oz (54.3 kg)  12/20/16 118 lb 12.8 oz (53.9 kg)  12/14/16 119 lb 9.6 oz (54.3 kg)   NAD, no obvious skin irritation over left breast - it is a little hyperpigmented  Impression:  The patient is tolerating radiotherapy.  Plan:  Continue radiotherapy as planned. Patient instructed to apply Radiplex to intact skin in treatment fields.  ________________________________   Eppie Gibson, M.D.

## 2016-12-27 NOTE — Progress Notes (Signed)
Kathy Bishop presents for her 6th fraction of radiation to her Left Breast. She denies pain. She has some fatigue in the afternoon. Her Left Breast is slightly red. She continues to use Radiaplex twice daily as directed.   BP 119/76   Pulse 61   Temp 98.3 F (36.8 C)   Ht 5\' 3"  (1.6 m)   Wt 119 lb 12.8 oz (54.3 kg)   SpO2 99% Comment: room air  BMI 21.22 kg/m    Wt Readings from Last 3 Encounters:  12/27/16 119 lb 12.8 oz (54.3 kg)  12/20/16 118 lb 12.8 oz (53.9 kg)  12/14/16 119 lb 9.6 oz (54.3 kg)

## 2016-12-28 ENCOUNTER — Ambulatory Visit
Admission: RE | Admit: 2016-12-28 | Discharge: 2016-12-28 | Disposition: A | Discharge status: Home / Self Care | Payer: BC Managed Care – PPO | Source: Ambulatory Visit | Attending: Radiation Oncology | Admitting: Radiation Oncology

## 2016-12-28 DIAGNOSIS — C50412 Malignant neoplasm of upper-outer quadrant of left female breast: Secondary | ICD-10-CM | POA: Diagnosis not present

## 2016-12-29 ENCOUNTER — Ambulatory Visit
Admission: RE | Admit: 2016-12-29 | Discharge: 2016-12-29 | Disposition: A | Discharge status: Home / Self Care | Payer: BC Managed Care – PPO | Source: Ambulatory Visit | Attending: Radiation Oncology | Admitting: Radiation Oncology

## 2016-12-29 DIAGNOSIS — C50412 Malignant neoplasm of upper-outer quadrant of left female breast: Secondary | ICD-10-CM | POA: Diagnosis not present

## 2016-12-30 ENCOUNTER — Ambulatory Visit
Admission: RE | Admit: 2016-12-30 | Discharge: 2016-12-30 | Disposition: A | Discharge status: Home / Self Care | Payer: BC Managed Care – PPO | Source: Ambulatory Visit | Attending: Radiation Oncology | Admitting: Radiation Oncology

## 2016-12-30 DIAGNOSIS — C50412 Malignant neoplasm of upper-outer quadrant of left female breast: Secondary | ICD-10-CM | POA: Diagnosis not present

## 2016-12-31 ENCOUNTER — Ambulatory Visit
Admission: RE | Admit: 2016-12-31 | Discharge: 2016-12-31 | Disposition: A | Discharge status: Home / Self Care | Payer: BC Managed Care – PPO | Source: Ambulatory Visit | Attending: Radiation Oncology | Admitting: Radiation Oncology

## 2016-12-31 DIAGNOSIS — C50412 Malignant neoplasm of upper-outer quadrant of left female breast: Secondary | ICD-10-CM | POA: Diagnosis not present

## 2017-01-03 ENCOUNTER — Ambulatory Visit: Payer: BC Managed Care – PPO

## 2017-01-04 ENCOUNTER — Ambulatory Visit
Admission: RE | Admit: 2017-01-04 | Discharge: 2017-01-04 | Disposition: A | Discharge status: Home / Self Care | Payer: BC Managed Care – PPO | Source: Ambulatory Visit | Attending: Radiation Oncology | Admitting: Radiation Oncology

## 2017-01-04 DIAGNOSIS — C50412 Malignant neoplasm of upper-outer quadrant of left female breast: Secondary | ICD-10-CM | POA: Diagnosis not present

## 2017-01-05 ENCOUNTER — Ambulatory Visit: Payer: BC Managed Care – PPO

## 2017-01-05 ENCOUNTER — Ambulatory Visit
Admission: RE | Admit: 2017-01-05 | Discharge: 2017-01-05 | Disposition: A | Discharge status: Home / Self Care | Payer: BC Managed Care – PPO | Source: Ambulatory Visit | Attending: Radiation Oncology | Admitting: Radiation Oncology

## 2017-01-05 DIAGNOSIS — C50412 Malignant neoplasm of upper-outer quadrant of left female breast: Secondary | ICD-10-CM | POA: Diagnosis not present

## 2017-01-05 DIAGNOSIS — Z170 Estrogen receptor positive status [ER+]: Principal | ICD-10-CM

## 2017-01-05 MED ORDER — BIAFINE EX EMUL
Freq: Once | CUTANEOUS | Status: AC
  Administered 2017-01-05: 14:00:00 via TOPICAL

## 2017-01-06 ENCOUNTER — Ambulatory Visit
Admission: RE | Admit: 2017-01-06 | Discharge: 2017-01-06 | Disposition: A | Discharge status: Home / Self Care | Payer: BC Managed Care – PPO | Source: Ambulatory Visit | Attending: Radiation Oncology | Admitting: Radiation Oncology

## 2017-01-06 DIAGNOSIS — C50412 Malignant neoplasm of upper-outer quadrant of left female breast: Secondary | ICD-10-CM | POA: Diagnosis not present

## 2017-01-07 ENCOUNTER — Ambulatory Visit
Admission: RE | Admit: 2017-01-07 | Discharge: 2017-01-07 | Disposition: A | Discharge status: Home / Self Care | Payer: BC Managed Care – PPO | Source: Ambulatory Visit | Attending: Radiation Oncology | Admitting: Radiation Oncology

## 2017-01-07 DIAGNOSIS — C50412 Malignant neoplasm of upper-outer quadrant of left female breast: Secondary | ICD-10-CM | POA: Diagnosis not present

## 2017-01-10 ENCOUNTER — Ambulatory Visit
Admission: RE | Admit: 2017-01-10 | Discharge: 2017-01-10 | Disposition: A | Discharge status: Home / Self Care | Payer: BC Managed Care – PPO | Source: Ambulatory Visit | Attending: Radiation Oncology | Admitting: Radiation Oncology

## 2017-01-10 ENCOUNTER — Ambulatory Visit: Payer: BC Managed Care – PPO

## 2017-01-10 ENCOUNTER — Ambulatory Visit
Admission: RE | Admit: 2017-01-10 | Payer: BC Managed Care – PPO | Source: Ambulatory Visit | Admitting: Radiation Oncology

## 2017-01-10 DIAGNOSIS — C50412 Malignant neoplasm of upper-outer quadrant of left female breast: Secondary | ICD-10-CM | POA: Diagnosis not present

## 2017-01-11 ENCOUNTER — Encounter: Payer: Self-pay | Admitting: Radiation Oncology

## 2017-01-11 ENCOUNTER — Ambulatory Visit
Admission: RE | Admit: 2017-01-11 | Discharge: 2017-01-11 | Disposition: A | Discharge status: Home / Self Care | Payer: BC Managed Care – PPO | Source: Ambulatory Visit | Attending: Radiation Oncology | Admitting: Radiation Oncology

## 2017-01-11 ENCOUNTER — Ambulatory Visit: Payer: BC Managed Care – PPO

## 2017-01-11 VITALS — BP 110/74 | HR 68 | Temp 98.4°F | Resp 16 | Wt 117.4 lb

## 2017-01-11 DIAGNOSIS — C50412 Malignant neoplasm of upper-outer quadrant of left female breast: Secondary | ICD-10-CM

## 2017-01-11 DIAGNOSIS — Z170 Estrogen receptor positive status [ER+]: Principal | ICD-10-CM

## 2017-01-11 NOTE — Progress Notes (Signed)
Weekly rad txs left breast, 16/20 completed,  erythema, , using biafine bid, helps a little with the itching stated, occasional twitching in breast, appetite good,muld fatigue BP 110/74 (BP Location: Right Arm, Patient Position: Sitting, Cuff Size: Normal)   Pulse 68   Temp 98.4 F (36.9 C) (Oral)   Resp 16   Wt 117 lb 6.4 oz (53.3 kg)   BMI 20.80 kg/m   Wt Readings from Last 3 Encounters:  01/11/17 117 lb 6.4 oz (53.3 kg)  12/27/16 119 lb 12.8 oz (54.3 kg)  12/20/16 118 lb 12.8 oz (53.9 kg)

## 2017-01-11 NOTE — Progress Notes (Signed)
   Weekly Management Note:  Outpatient    ICD-9-CM ICD-10-CM   1. Malignant neoplasm of upper-outer quadrant of left breast in female, estrogen receptor positive ([facility]) 174.4 C50.412    V86.0 Z17.0     Current Dose: 42.05 Gy  Projected Dose: 50.05 Gy   Narrative:  The patient presents for routine under treatment assessment.  CBCT/MVCT images/Port film x-rays were reviewed.  The chart was checked.   The patient completed 16/20 treatments to the left breast. She reports itching of the breast with occasional twitching. She has a good appetite and mild fatigue.  Physical Findings:  weight is 117 lb 6.4 oz (53.3 kg). Her oral temperature is 98.4 F (36.9 C). Her blood pressure is 110/74 and her pulse is 68. Her respiration is 16.   Wt Readings from Last 3 Encounters:  01/11/17 117 lb 6.4 oz (53.3 kg)  12/27/16 119 lb 12.8 oz (54.3 kg)  12/20/16 118 lb 12.8 oz (53.9 kg)   Lungs are clear to auscultation bilaterally. Heart has regular rate and rhythm. Hyperpigmentation and erythema of the left breast, no moist desquamation.  Impression:  The patient is tolerating radiotherapy.  Plan:  Continue radiotherapy as planned. The patient will continue using biafine in the treatment area. ________________________________ -----------------------------------  Blair Promise, PhD, MD  This document serves as a record of services personally performed by Gery Pray, MD. It was created on his behalf by Darcus Austin, a trained medical scribe. The creation of this record is based on the scribe's personal observations and the provider's statements to them. This document has been checked and approved by the attending provider.

## 2017-01-12 ENCOUNTER — Ambulatory Visit
Admission: RE | Admit: 2017-01-12 | Discharge: 2017-01-12 | Disposition: A | Discharge status: Home / Self Care | Payer: BC Managed Care – PPO | Source: Ambulatory Visit | Attending: Radiation Oncology | Admitting: Radiation Oncology

## 2017-01-12 DIAGNOSIS — C50412 Malignant neoplasm of upper-outer quadrant of left female breast: Secondary | ICD-10-CM | POA: Diagnosis not present

## 2017-01-13 ENCOUNTER — Encounter: Payer: Self-pay | Admitting: *Deleted

## 2017-01-13 ENCOUNTER — Ambulatory Visit
Admission: RE | Admit: 2017-01-13 | Discharge: 2017-01-13 | Disposition: A | Discharge status: Home / Self Care | Payer: BC Managed Care – PPO | Source: Ambulatory Visit | Attending: Radiation Oncology | Admitting: Radiation Oncology

## 2017-01-13 DIAGNOSIS — C50412 Malignant neoplasm of upper-outer quadrant of left female breast: Secondary | ICD-10-CM | POA: Diagnosis not present

## 2017-01-13 NOTE — Progress Notes (Signed)
[place] over after radiation treatment for a nurse to check her left breast.  C/O itching at times with erythema already changed to biafine.  Asked if she has been using hydrocortisone cream 1%, she mentioned that Dr. Isidore Moos mentioned to her on Monday, 01-10-17 about using the Hydrocortisone and she will purchase some this afternoon.  Telfa pads given showed how to apply the telfa pad under her left breast.

## 2017-01-14 ENCOUNTER — Ambulatory Visit
Admission: RE | Admit: 2017-01-14 | Discharge: 2017-01-14 | Disposition: A | Discharge status: Home / Self Care | Payer: BC Managed Care – PPO | Source: Ambulatory Visit | Attending: Radiation Oncology | Admitting: Radiation Oncology

## 2017-01-14 ENCOUNTER — Ambulatory Visit: Admission: RE | Admit: 2017-01-14 | Payer: BC Managed Care – PPO | Source: Ambulatory Visit

## 2017-01-14 DIAGNOSIS — C50412 Malignant neoplasm of upper-outer quadrant of left female breast: Secondary | ICD-10-CM | POA: Diagnosis not present

## 2017-01-17 ENCOUNTER — Ambulatory Visit
Admission: RE | Admit: 2017-01-17 | Discharge: 2017-01-17 | Disposition: A | Discharge status: Home / Self Care | Payer: BC Managed Care – PPO | Source: Ambulatory Visit | Attending: Radiation Oncology | Admitting: Radiation Oncology

## 2017-01-17 ENCOUNTER — Telehealth: Payer: Self-pay | Admitting: *Deleted

## 2017-01-17 DIAGNOSIS — C50412 Malignant neoplasm of upper-outer quadrant of left female breast: Secondary | ICD-10-CM

## 2017-01-17 DIAGNOSIS — Z170 Estrogen receptor positive status [ER+]: Principal | ICD-10-CM

## 2017-01-17 MED ORDER — BIAFINE EX EMUL
CUTANEOUS | Status: DC | PRN
  Administered 2017-01-17: 16:00:00 via TOPICAL

## 2017-01-17 NOTE — Telephone Encounter (Signed)
  Oncology Nurse Navigator Documentation  Navigator Location: [facility] (01/17/17 1100)   )Navigator Encounter Type: Telephone (01/17/17 1100) Telephone: [place] Call (01/17/17 1100)     Surgery Date: 11/09/16 (01/17/17 1100)             Patient Visit Type: RadOnc (01/17/17 1100) Treatment Phase: Final Radiation Tx (01/17/17 1100)                            Time Spent with Patient: 15 (01/17/17 1100)

## 2017-01-18 ENCOUNTER — Encounter: Payer: Self-pay | Admitting: Radiation Oncology

## 2017-01-18 NOTE — Progress Notes (Signed)
  Radiation Oncology         (336) [phone] ________________________________  Name: Kathy Bishop MRN: [id number]  Date: 01/18/2017  DOB: 02/14/1951  End of Treatment Note  Diagnosis:   T2N0M0 UOQ Invasive Ductal Carcinoma of the Left Breast , Multifocal T2N0  ER+ / PR+ / Her2neg, Grade 2   Indication for treatment:  Curative       Radiation treatment dates:   12/20/16 - 01/17/17  Site/dose:   Left Breast treated to 40.05 Gy in 15 fx of 2.67 Gy/fx  and a 10 Gy boost in 5 fx of 2 Gy/fx  Beams/energy:    Left breast: 3D // 6X  Boost: Isodose Plan // 10X, 6X  Narrative: The patient tolerated radiation treatment relatively well.   Pt endorsed a good appetite and only mild fatigue throughout treatment. She also endorsed itching of the breast with occasional twitching.  Plan: The patient has completed radiation treatment. The patient will return to radiation oncology clinic for routine followup in one month. I advised them to call or return sooner if they have any questions or concerns related to their recovery or treatment.  -----------------------------------  Eppie Gibson, MD  This document serves as a record of services personally performed by Eppie Gibson, MD. It was created on his behalf by Linward Natal, a trained medical scribe. The creation of this record is based on the scribe's personal observations and the provider's statements to them. This document has been checked and approved by the attending provider.

## 2017-02-08 ENCOUNTER — Other Ambulatory Visit: Payer: Self-pay | Admitting: Adult Health

## 2017-02-08 DIAGNOSIS — Z170 Estrogen receptor positive status [ER+]: Principal | ICD-10-CM

## 2017-02-08 DIAGNOSIS — C50412 Malignant neoplasm of upper-outer quadrant of left female breast: Secondary | ICD-10-CM

## 2017-02-09 ENCOUNTER — Telehealth: Payer: Self-pay | Admitting: Oncology

## 2017-02-09 ENCOUNTER — Other Ambulatory Visit (HOSPITAL_BASED_OUTPATIENT_CLINIC_OR_DEPARTMENT_OTHER): Payer: BC Managed Care – PPO

## 2017-02-09 ENCOUNTER — Ambulatory Visit (HOSPITAL_BASED_OUTPATIENT_CLINIC_OR_DEPARTMENT_OTHER): Payer: BC Managed Care – PPO | Admitting: Oncology

## 2017-02-09 VITALS — BP 113/85 | HR 72 | Temp 98.0°F | Resp 20 | Wt 118.2 lb

## 2017-02-09 DIAGNOSIS — Z170 Estrogen receptor positive status [ER+]: Principal | ICD-10-CM

## 2017-02-09 DIAGNOSIS — C50412 Malignant neoplasm of upper-outer quadrant of left female breast: Secondary | ICD-10-CM

## 2017-02-09 LAB — CBC WITH DIFFERENTIAL/PLATELET
BASO%: 0.6 % (ref 0.0–2.0)
Basophils Absolute: 0 10*3/uL (ref 0.0–0.1)
EOS ABS: 0.1 10*3/uL (ref 0.0–0.5)
EOS%: 1.8 % (ref 0.0–7.0)
HCT: 40.5 % (ref 34.8–46.6)
HEMOGLOBIN: 13.5 g/dL (ref 11.6–15.9)
LYMPH%: 26.2 % (ref 14.0–49.7)
MCH: 32 pg (ref 25.1–34.0)
MCHC: 33.5 g/dL (ref 31.5–36.0)
MCV: 95.7 fL (ref 79.5–101.0)
MONO#: 0.3 10*3/uL (ref 0.1–0.9)
MONO%: 8.2 % (ref 0.0–14.0)
NEUT%: 63.2 % (ref 38.4–76.8)
NEUTROS ABS: 2.5 10*3/uL (ref 1.5–6.5)
Platelets: 210 10*3/uL (ref 145–400)
RBC: 4.23 10*6/uL (ref 3.70–5.45)
RDW: 12.7 % (ref 11.2–14.5)
WBC: 3.9 10*3/uL (ref 3.9–10.3)
lymph#: 1 10*3/uL (ref 0.9–3.3)

## 2017-02-09 LAB — COMPREHENSIVE METABOLIC PANEL
ALBUMIN: 4.2 g/dL (ref 3.5–5.0)
ALK PHOS: 65 U/L (ref 40–150)
ALT: 14 U/L (ref 0–55)
AST: 16 U/L (ref 5–34)
Anion Gap: 8 mEq/L (ref 3–11)
BUN: 18.8 mg/dL (ref 7.0–26.0)
CO2: 30 mEq/L — ABNORMAL HIGH (ref 22–29)
CREATININE: 0.8 mg/dL (ref 0.6–1.1)
Calcium: 9.6 mg/dL (ref 8.4–10.4)
Chloride: 103 mEq/L (ref 98–109)
EGFR: 76 mL/min/{1.73_m2} — AB (ref >90)
GLUCOSE: 117 mg/dL (ref 70–140)
Potassium: 4.3 mEq/L (ref 3.5–5.1)
SODIUM: 141 meq/L (ref 136–145)
TOTAL PROTEIN: 6.8 g/dL (ref 6.4–8.3)
Total Bilirubin: 0.36 mg/dL (ref 0.20–1.20)

## 2017-02-09 MED ORDER — ANASTROZOLE 1 MG PO TABS: 1.0000 mg | ORAL_TABLET | Freq: Every day | ORAL | 4 refills | Status: DC

## 2017-02-09 MED ORDER — KETOCONAZOLE 2 % EX CREA: 1.0000 "application " | TOPICAL_CREAM | Freq: Every day | CUTANEOUS | 0 refills | Status: DC

## 2017-02-09 NOTE — Progress Notes (Signed)
Objective: Middle-aged white woman who appears well. Vitals:   02/09/17 1326  BP: 113/85  Pulse: 72  Resp: 20  Temp: 98 F (36.7 C)     Body mass index is 20.94 kg/m.    ECOG FS:0 - Asymptomatic Filed Weights   02/09/17 1326  Weight: 118 lb 3.2 oz (53.6 kg)   Sclerae unicteric, EOMs intact Oropharynx clear and moist No cervical or supraclavicular adenopathy Lungs no rales or rhonchi Heart regular rate and rhythm Abd soft, nontender, positive bowel sounds MSK no focal spinal tenderness, no upper extremity lymphedema Neuro: nonfocal, well oriented, appropriate affect Breasts: The right breast is unremarkable. The left breast is status post lumpectomy and radiation. There is significant irregularity in the lateral superior aspect of the breast, with a stellate area of induration but nothing suspicious for residual or recurrent disease. There is minimal fungal rash in the inframammary fold on the left. Both axillae are benign.  LAB RESULTS:  CMP     Component Value Date/Time   NA 139 09/10/2016 1339   K 4.0 09/10/2016 1339   CL 108 08/03/2016 0822   CO2 26 09/10/2016 1339   GLUCOSE 124 09/10/2016 1339   BUN 21.8 09/10/2016 1339   CREATININE 0.8 09/10/2016 1339   CALCIUM 9.2 09/10/2016 1339   PROT 6.8 09/10/2016 1339   ALBUMIN 3.9 09/10/2016 1339   AST 16 09/10/2016 1339   ALT 16 09/10/2016 1339   ALKPHOS 51 09/10/2016 1339   BILITOT 0.51 09/10/2016 1339   GFRNONAA >90 02/14/2013 0658   GFRAA >90 02/14/2013 0658    INo results found for: SPEP, UPEP  Lab Results  Component Value Date   WBC 3.9 02/09/2017   NEUTROABS 2.5 02/09/2017   HGB 13.5 02/09/2017   HCT 40.5 02/09/2017   MCV 95.7 02/09/2017   PLT 210 02/09/2017      Chemistry      Component Value Date/Time   NA 139 09/10/2016 1339   K 4.0 09/10/2016 1339   CL 108 08/03/2016 0822   CO2 26 09/10/2016 1339   BUN 21.8 09/10/2016 1339   CREATININE 0.8 09/10/2016 1339      Component Value Date/Time   CALCIUM 9.2 09/10/2016 1339   ALKPHOS 51 09/10/2016 1339   AST 16 09/10/2016 1339   ALT 16 09/10/2016 1339   BILITOT 0.51 09/10/2016 1339       No results found for: LABCA2  No components found for: LABCA125  No results for input(s): INR in the last 168 hours.  Urinalysis    Component Value Date/Time   COLORURINE YELLOW 02/14/2013 0703   APPEARANCEUR CLOUDY (A) 02/14/2013 0703   LABSPEC 1.018 02/14/2013 0703   PHURINE 8.0 02/14/2013 0703   GLUCOSEU NEGATIVE 02/14/2013 0703   HGBUR NEGATIVE 02/14/2013 0703   BILIRUBINUR NEGATIVE 02/14/2013 0703   KETONESUR 15 (A) 02/14/2013 0703   PROTEINUR NEGATIVE 02/14/2013 0703   UROBILINOGEN 0.2 02/14/2013 0703   NITRITE NEGATIVE 02/14/2013 0703   LEUKOCYTESUR NEGATIVE 02/14/2013 0703     STUDIES: Bone density results reviewed with the patient  ELIGIBLE FOR AVAILABLE RESEARCH PROTOCOL: no  ASSESSMENT: 66 y.o. [place] woman status post left breast upper outer quadrant biopsy x2 on 08/20/2016 for a clinical mT2 N0, stage IIA invasive ductal carcinoma, both tumors showing identical prognostic panels: Estrogen and progesterone receptor strongly positive, HER-2 negative, with an MIB-1 of 10-15%   (1) neoadjuvant anastrozole started 09/10/2016 in anticipation possible surgical delays  (2) status post left lumpectomy and sentinel lymph node  sampling 11/09/2016 for a mpT2 pN0, stage IB (2018 classification) invasive ductal carcinoma, with negative margins.  (3) Oncotype DX score of 25 predicts a risk of outside the breast recurrence of 17% if the patient's only systemic therapy is tamoxifen for 5 years. It predicts a risk reduction of approximately an additional 4% if chemotherapy is added.  (a) the patient opted against adjuvant chemotherapy  (4) adjuvant radiation  12/20/16 - 01/17/17 Site/dose:   Left Breast treated to 40.05 Gy in 15 fx of 2.67 Gy/fx  and a 10 Gy boost in 5 fx of 2 Gy/fx   (5) continue anastrozole a minimum of 5  years  (a) bone density 12/08/2016 showed a T score of -0.2 (normal).  PLAN: I spent approximately 30 minutes with Markel with most of that time spent discussing her situation to date and future plans. In particular she understands that we do not do routine scans in patients like her. We do evaluate new symptoms aggressively however.  Jaryn did very well with her radiation treatments, and she has 80-90% recovered her baseline energy. Her left breast also has recovered nicely, although there is a small fungal rash in the inframammary fold on the left. I wrote her a prescription for ketoconazole which should clear that.  She is tolerating the anastrozole well and the plan will be to continue that for a total of 5 years.  At this point she is not interested in our intimacy and pelvic health physical therapy program  We reviewed her bone density results, and these are very favorable. She is taking vitamin D 1000 units daily. She has an excellent walking program. We will repeat a bone density in 2 years.  She will be seeing Dr. Rush Farmer in October. She will see her primary care physician in July or August. Accordingly she will return to see me in January of next year. I will see her on a yearly basis thereafter. She knows to call for any problems that may develop before that visit.   ::Chauncey Cruel, MD   02/09/2017 1:38 PM Medical Oncology and Hematology [facility] [place], [place] [address] Tel. [phone]    Fax. [phone]

## 2017-02-09 NOTE — Telephone Encounter (Signed)
Gave patient AVS and calender per 5/2 los.  

## 2017-02-18 ENCOUNTER — Encounter: Payer: Self-pay | Admitting: Radiation Oncology

## 2017-02-25 ENCOUNTER — Ambulatory Visit
Admission: RE | Admit: 2017-02-25 | Discharge: 2017-02-25 | Disposition: A | Discharge status: Home / Self Care | Payer: BC Managed Care – PPO | Source: Ambulatory Visit | Attending: Radiation Oncology | Admitting: Radiation Oncology

## 2017-02-25 ENCOUNTER — Encounter: Payer: Self-pay | Admitting: Radiation Oncology

## 2017-02-25 VITALS — BP 141/79 | HR 64 | Temp 98.5°F | Ht 63.0 in | Wt 120.0 lb

## 2017-02-25 DIAGNOSIS — Z923 Personal history of irradiation: Secondary | ICD-10-CM | POA: Diagnosis not present

## 2017-02-25 DIAGNOSIS — Z79811 Long term (current) use of aromatase inhibitors: Secondary | ICD-10-CM | POA: Diagnosis not present

## 2017-02-25 DIAGNOSIS — C50412 Malignant neoplasm of upper-outer quadrant of left female breast: Secondary | ICD-10-CM | POA: Insufficient documentation

## 2017-02-25 DIAGNOSIS — Z08 Encounter for follow-up examination after completed treatment for malignant neoplasm: Secondary | ICD-10-CM | POA: Insufficient documentation

## 2017-02-25 DIAGNOSIS — Z170 Estrogen receptor positive status [ER+]: Secondary | ICD-10-CM | POA: Diagnosis not present

## 2017-02-25 DIAGNOSIS — Z79899 Other long term (current) drug therapy: Secondary | ICD-10-CM | POA: Diagnosis not present

## 2017-02-25 HISTORY — DX: Personal history of irradiation: Z92.3

## 2017-02-25 NOTE — Addendum Note (Signed)
Encounter addended by: Rocklin Soderquist, Stephani Police, RN on: 02/25/2017  4:41 PM<BR>    Actions taken: Charge Capture section accepted

## 2017-02-25 NOTE — Progress Notes (Signed)
Ms. Berens presents for follow up of radiation completed 01/17/17 to her Left Breast. She denies pain. She does have some fatigue, and will rest at times as needed. Her Left Breast has healed. She has occasional itching. She will begin using a vitamin E cream as needed. She is taking anastrozole. She has a survivorship appointment on 05/10/17  BP (!) 141/79   Pulse 64   Temp 98.5 F (36.9 C)   Ht 5\' 3"  (1.6 m)   Wt 120 lb (54.4 kg)   SpO2 100% Comment: room air  BMI 21.26 kg/m    Wt Readings from Last 3 Encounters:  02/25/17 120 lb (54.4 kg)  02/09/17 118 lb 3.2 oz (53.6 kg)  01/11/17 117 lb 6.4 oz (53.3 kg)

## 2017-02-25 NOTE — Progress Notes (Signed)
  Radiation Oncology         [phone] ________________________________  Name: Kathy Bishop MRN: [id number]  Date: 02/25/2017  DOB: [birth year]  Follow-Up Visit Note  Outpatient  CC: Lucille Passy, MD  Magrinat, Virgie Dad, MD  Diagnosis and Prior Radiotherapy:    ICD-9-CM ICD-10-CM   1. Malignant neoplasm of upper-outer quadrant of left breast in female, estrogen receptor positive ([place]) 174.4 C50.412    V86.0 Z17.0     T2N0M0 UOQ Invasive Ductal Carcinoma of the Left Breast , Multifocal T2N0  ER+ / PR+ / Her2neg, Grade 2   12/20/16 - 01/17/17 : Left Breast treated to 40.05 Gy in 15 fractions, and then boosted an additional 10 Gy in 5 fractions  CHIEF COMPLAINT: Here for follow-up and surveillance of left breast cancer  Narrative:  The patient returns today for routine follow-up of radiation completed 01/17/17.  On review of systems, the patient denies pain. She reports some fatigue, and rests as needed. She reports occasional itching to her left breast. The patient feels she is healing very well.   The patient is now taking Anastrozole. She has a Survivorship appointment on 05/10/17.                         ALLERGIES:  has No Known Allergies.  Meds: Current Outpatient Prescriptions  Medication Sig Dispense Refill  . anastrozole (ARIMIDEX) 1 MG tablet Take 1 tablet (1 mg total) by mouth daily. 90 tablet 4  . escitalopram (LEXAPRO) 20 MG tablet TAKE 1 AND 1/2 TABLETS DAILY 135 tablet 2  . gabapentin (NEURONTIN) 300 MG capsule Take 1 capsule (300 mg total) by mouth at bedtime. 90 capsule 4  . ketoconazole (NIZORAL) 2 % cream Apply 1 application topically daily. 15 g 0   No current facility-administered medications for this encounter.     Physical Findings: The patient is in no acute distress. Patient is alert and oriented.  height is 5\' 3"  (1.6 m) and weight is 120 lb (54.4 kg). Her temperature is 98.5 F (36.9 C). Her blood pressure is 141/79 (abnormal) and her pulse is 64. Her  oxygen saturation is 100%.    Left breast is mildly hyperpigmented, skin has healed very well and is completely intact.    Lab Findings: Lab Results  Component Value Date   WBC 3.9 02/09/2017   HGB 13.5 02/09/2017   HCT 40.5 02/09/2017   MCV 95.7 02/09/2017   PLT 210 02/09/2017    Radiographic Findings: No results found.  Impression/Plan:   The patient will use Vitamin E lotion on skin within the treatment fields 1-2 times daily for 2-3 months.  She will continue with yearly mammograms. She will follow with medical oncology as indicated. The patient will follow up with me prn.   _____________________________________   Eppie Gibson, MD  This document serves as a record of services personally performed by Eppie Gibson, MD. It was created on her behalf by Maryla Morrow, a trained medical scribe. The creation of this record is based on the scribe's personal observations and the provider's statements to them. This document has been checked and approved by the attending provider.

## 2017-04-28 ENCOUNTER — Encounter: Payer: Self-pay | Admitting: Family Medicine

## 2017-04-28 ENCOUNTER — Ambulatory Visit (INDEPENDENT_AMBULATORY_CARE_PROVIDER_SITE_OTHER): Payer: BC Managed Care – PPO | Admitting: Family Medicine

## 2017-04-28 ENCOUNTER — Encounter: Payer: Self-pay | Admitting: *Deleted

## 2017-04-28 DIAGNOSIS — R21 Rash and other nonspecific skin eruption: Secondary | ICD-10-CM

## 2017-04-28 MED ORDER — PREDNISONE 20 MG PO TABS: ORAL_TABLET | ORAL | 0 refills | Status: DC

## 2017-04-28 MED ORDER — PERMETHRIN 1 % EX LOTN: 1.0000 "application " | TOPICAL_LOTION | Freq: Once | CUTANEOUS | 0 refills | Status: DC

## 2017-04-28 MED ORDER — PERMETHRIN 5 % EX CREA: TOPICAL_CREAM | CUTANEOUS | 0 refills | Status: DC

## 2017-04-28 MED ORDER — HYDROXYZINE HCL 10 MG PO TABS: 10.0000 mg | ORAL_TABLET | Freq: Three times a day (TID) | ORAL | 1 refills | Status: DC | PRN

## 2017-04-28 NOTE — Progress Notes (Signed)
Rash.  Started about 1 week ago.  Itching on the L upper breast.  H/o radiation with CA tx prev but not currently.  Now with rash on the B arms, and on the trunk.  All the lesions itch.  "it's driving me crazy."  No triggers, no new meds, soaps, etc.  Lesions didn't blister.  Failed otc tx.  No one else with similar.    She has a mild ST earlier this week.  Also with dry mouth noted recently, unclear if related to gabapentin, d/w pt.    Meds, vitals, and allergies reviewed.   ROS: Per HPI unless specifically indicated in ROS section   GEN: nad, alert and oriented HEENT: mucous membranes moist, TM , OP with minimal posterior irritation NECK: supple w/o LA CV: rrr. PULM: ctab, no inc wob EXT: no edema SKIN: diffuse blanching irregular non linear non dermatomal small red papules with excoriation w/o blistering on the B arms and legs and trunk, sparing the palms and neck and face.

## 2017-04-28 NOTE — Assessment & Plan Note (Signed)
Unclear source.  Itchy, reasonable to treat symptomatically with prednisone and atarax, with routine cautions given.  I don't think this is scabies but rx printed for permethrin in case she doesn't improve.  D/w pt.  She agrees.  Update [place] as needed.  Not systemically ill appearing and okay for outpatient f/u.

## 2017-04-28 NOTE — Patient Instructions (Signed)
Prednisone with food, atarax as needed.  If persistent, then use the permethrin lotion.  Take care.  Glad to see you.  Update [place] as needed.

## 2017-05-10 ENCOUNTER — Encounter: Payer: BC Managed Care – PPO | Admitting: Adult Health

## 2017-05-10 NOTE — Progress Notes (Deleted)
CLINIC:  Survivorship   REASON FOR VISIT:  Routine follow-up post-treatment for a recent history of breast cancer.  BRIEF ONCOLOGIC HISTORY:  Oncology History   3 1/     Malignant neoplasm of upper-outer quadrant of left breast in female, estrogen receptor positive ([place])   08/20/2016 Initial Biopsy    Left breast, needle core biopsy, 2 o'clock, 8cmfn: IDC, DCIS with necrosis, grade 2, ER+(95%), PR+(100%), Ki-67 15%, HER-2 negative (ratio 0.78). Left breast, needle core biopsy, 2 o'clock, 5cmfn: IDC, grade 2, ER+(95%), PR+(100%), Ki-67 10%, HER-2 negative (ratio 1.06).        08/31/2016 Initial Diagnosis    Malignant neoplasm of upper-outer quadrant of left breast in female, estrogen receptor positive ([place])     09/10/2016 -  Anti-estrogen oral therapy    Anastrozole daily bone density 12/08/2016 showed a T score of -0.2 (normal).      11/09/2016 Surgery    Left breast lumpectomy Kathy Bishop): IDC with calcifications, Grade II/III, two foci, 3cm and 0.6 cm, DCIS with calcifications, +lymphovascular invasion, 1 SLN negative, T2, N0      11/09/2016 Oncotype testing    25/17% intermediate risk      12/20/2016 - 01/17/2017 Radiation Therapy    Left Breast treated to 40.05 Gy in15 fx of 2.67 Gy/fx and a 10 Gy boost in5 fx of 2 Gy/fx        INTERVAL HISTORY:  Kathy Bishop presents to the [facility] today for our initial meeting to review her survivorship care plan detailing her treatment course for breast cancer, as well as monitoring long-term side effects of that treatment, education regarding health maintenance, screening, and overall wellness and health promotion.     Overall, Kathy Bishop reports feeling quite well since completing her radiation therapy approximately 3 months ago.  She ***    REVIEW OF SYSTEMS:  Review of Systems - Oncology Breast: Denies any new nodularity, masses, tenderness, nipple changes, or nipple discharge.      ONCOLOGY TREATMENT TEAM:    1. Surgeon:  Dr. Marland Bishop at [facility] 2. Medical Oncologist: Dr. Marland Bishop  3. Radiation Oncologist: Dr. Marland Bishop    PAST MEDICAL/SURGICAL HISTORY:  Past Medical History:  Diagnosis Date  . Anxiety   . Breast cancer ([place])   . Depression   . Dermatitis    allergic  . Foot pain, right   . History of radiation therapy 12/20/16- 01/17/17   Left Breast 40.05 Gy in 15 fractions and a 10 Gy boost in 5 fractions.   . Malignant neoplasm of upper-outer quadrant of left breast in female, estrogen receptor positive ([place]) 08/31/2016  . Need for prophylactic hormone replacement therapy (postmenopausal)   . Nocturia   . Routine general medical examination at a health care facility   . Symptomatic menopausal or female climacteric states   . Thoracic or lumbosacral neuritis or radiculitis, unspecified    Past Surgical History:  Procedure Laterality Date  . BREAST LUMPECTOMY Left   . BREAST LUMPECTOMY WITH SENTINEL LYMPH NODE BIOPSY Left 11/09/2016   Procedure: LEFT BREAST LUMPECTOMY WITH SENTINEL LYMPH NODE BX;  Surgeon: Kathy Bishop;  Location: [facility];  Service: General;  Laterality: Left;  . TONSILLECTOMY     age 4     ALLERGIES:  No Known Allergies   CURRENT MEDICATIONS:  Outpatient Encounter Prescriptions as of 05/10/2017  Medication Sig  . anastrozole (ARIMIDEX) 1 MG tablet Take 1 tablet (1 mg total) by mouth daily.  Kathy Bishop escitalopram (  LEXAPRO) 20 MG tablet TAKE 1 AND 1/2 TABLETS DAILY  . gabapentin (NEURONTIN) 300 MG capsule Take 1 capsule (300 mg total) by mouth at bedtime.  . hydrOXYzine (ATARAX/VISTARIL) 10 MG tablet Take 1 tablet (10 mg total) by mouth 3 (three) times daily as needed for itching.  . permethrin (ELIMITE) 5 % cream Apply from the neck down, let dry, wash off after 8 hours.  . predniSONE (DELTASONE) 20 MG tablet 2 tabs a day for 5 days, then 1 tab a day for 5 days, with food.   No facility-administered encounter medications on file as of  05/10/2017.      ONCOLOGIC FAMILY HISTORY:  No family history on file.   GENETIC COUNSELING/TESTING: ***  SOCIAL HISTORY:  Kathy Bishop is /single/married/divorced/widowed/separated and lives alone/with her spouse/family/friend in (city), [place].  She has (#) children and they live in (city).  Kathy Bishop is currently retired/disabled/working part-time/full-time as ***.  She denies any current or history of tobacco, alcohol, or illicit drug use.     PHYSICAL EXAMINATION:  Vital Signs:  There were no vitals filed for this visit. There were no vitals filed for this visit. General: Well-nourished, well-appearing female in no acute distress.  She is unaccompanied/accompanied in clinic by her ***** today.   HEENT: Head is normocephalic.  Pupils equal and reactive to light. Conjunctivae clear without exudate.  Sclerae anicteric. Oral mucosa is pink, moist.  Oropharynx is pink without lesions or erythema.  Lymph: No cervical, supraclavicular, or infraclavicular lymphadenopathy noted on palpation.  Cardiovascular: Regular rate and rhythm.Kathy Bishop Respiratory: Clear to auscultation bilaterally. Chest expansion symmetric; breathing non-labored.  GI: Abdomen soft and round; non-tender, non-distended. Bowel sounds normoactive.  GU: Deferred.  Neuro: No focal deficits. Steady gait.  Psych: Mood and affect normal and appropriate for situation.  Extremities: No edema. MSK: No focal spinal tenderness to palpation.  Full range of motion in bilateral upper extremities Skin: Warm and dry.  LABORATORY DATA:  None for this visit.  DIAGNOSTIC IMAGING:  None for this visit.      ASSESSMENT AND PLAN:  Kathy Bishop is a pleasant 66 y.o. female with Stage *** right/left breast invasive ductal carcinoma, ER+/PR+/HER2-, diagnosed in (date), treated with lumpectomy, adjuvant radiation therapy, and anti-estrogen therapy with *** beginning in (date).  She presents to the Survivorship Clinic for our initial  meeting and routine follow-up post-completion of treatment for breast cancer.    1. Stage *** right/left breast cancer:  [name] is continuing to recover from definitive treatment for breast cancer. She will follow-up with her medical oncologist, Dr. Ross Ludwig in (month) /2017 with history and physical exam per surveillance protocol.  She will continue her anti-estrogen therapy with (drug). Thus far, she is tolerating the *** well, with minimal side effects. She was instructed to make Dr. Lindi Adie or myself aware if she begins to experience any worsening side effects of the medication and I could see her back in clinic to help manage those side effects, as needed. Though the incidence is low, there is an associated risk of endometrial cancer with anti-estrogen therapies like Tamoxifen.  Ms. Seedorf was encouraged to contact Dr. Carrington Clamp or myself with any vaginal bleeding while taking Tamoxifen. Other side effects of Tamoxifen were again reviewed with her as well. Today, a comprehensive survivorship care plan and treatment summary was reviewed with the patient today detailing her breast cancer diagnosis, treatment course, potential late/long-term effects of treatment, appropriate follow-up care with recommendations for the future, and patient education  resources.  A copy of this summary, along with a letter will be sent to the patient's primary care provider via mail/fax/In Basket message after today's visit.    #. Problem(s) at Visit______________  #. Bone health:  Given Ms. Westry's age/history of breast cancer and her current treatment regimen including anti-estrogen therapy with _______, she is at risk for bone demineralization.  Her last DEXA scan was **/**/20**, which showed (results).***  In the meantime, she was encouraged to increase her consumption of foods rich in calcium, as well as increase her weight-bearing activities.  She was given education on specific activities to promote  bone health.  #. Cancer screening:  Due to Ms. Rushlow's history and her age, she should receive screening for skin cancers, colon cancer, and gynecologic cancers.  The information and recommendations are listed on the patient's comprehensive care plan/treatment summary and were reviewed in detail with the patient.    #. Health maintenance and wellness promotion: Ms. Ovitt was encouraged to consume 5-7 servings of fruits and vegetables per day. We reviewed the "Nutrition Rainbow" handout, as well as the handout "Take Control of Your Health and Reduce Your Cancer Risk" from the [place].  She was also encouraged to engage in moderate to vigorous exercise for 30 minutes per day most days of the week. We discussed the [employer] fitness program, which is designed for cancer survivors to help them become more physically fit after cancer treatments.  She was instructed to limit her alcohol consumption and continue to abstain from tobacco use/***was encouraged stop smoking.     #. Support services/counseling: It is not uncommon for this period of the patient's cancer care trajectory to be one of many emotions and stressors.  We discussed an opportunity for her to participate in the next session of [facility] ("Finding Your New Normal") support group series designed for patients after they have completed treatment.   Ms. Mole was encouraged to take advantage of our many other support services programs, support groups, and/or counseling in coping with her new life as a cancer survivor after completing anti-cancer treatment.  She was offered support today through active listening and expressive supportive counseling.  She was given information regarding our available services and encouraged to contact me with any questions or for help enrolling in any of our support group/programs.    Dispo:   -Return to cancer center ***  -Mammogram due in *** -Follow up with surgery *** -She is welcome to  return back to the Survivorship Clinic at any time; no additional follow-up needed at this time.  -Consider referral back to survivorship as a long-term survivor for continued surveillance  A total of (30) minutes of face-to-face time was spent with this patient with greater than 50% of that time in counseling and care-coordination.   Gardenia Phlegm, NP Survivorship Program [facility] [phone]   Note: PRIMARY CARE PROVIDER Lucille Passy, [place] [phone]

## 2017-07-14 ENCOUNTER — Other Ambulatory Visit: Payer: Self-pay | Admitting: Family Medicine

## 2017-07-14 NOTE — Telephone Encounter (Signed)
Received refill electronically Last refill office visit 04/28/17 Last refill 10/12/16 #135/2 refills

## 2017-08-09 ENCOUNTER — Other Ambulatory Visit: Payer: Self-pay | Admitting: Oncology

## 2017-08-09 DIAGNOSIS — Z9889 Other specified postprocedural states: Secondary | ICD-10-CM

## 2017-09-13 ENCOUNTER — Ambulatory Visit
Admission: RE | Admit: 2017-09-13 | Discharge: 2017-09-13 | Disposition: A | Discharge status: Home / Self Care | Payer: BC Managed Care – PPO | Source: Ambulatory Visit | Attending: Oncology | Admitting: Oncology

## 2017-09-13 DIAGNOSIS — Z9889 Other specified postprocedural states: Secondary | ICD-10-CM

## 2017-10-12 ENCOUNTER — Other Ambulatory Visit (HOSPITAL_BASED_OUTPATIENT_CLINIC_OR_DEPARTMENT_OTHER): Payer: BC Managed Care – PPO

## 2017-10-12 ENCOUNTER — Telehealth: Payer: Self-pay | Admitting: Oncology

## 2017-10-12 ENCOUNTER — Ambulatory Visit (HOSPITAL_BASED_OUTPATIENT_CLINIC_OR_DEPARTMENT_OTHER): Payer: BC Managed Care – PPO | Admitting: Oncology

## 2017-10-12 VITALS — BP 143/94 | HR 77 | Temp 98.4°F | Resp 18 | Ht 63.0 in | Wt 126.1 lb

## 2017-10-12 DIAGNOSIS — C50412 Malignant neoplasm of upper-outer quadrant of left female breast: Secondary | ICD-10-CM

## 2017-10-12 DIAGNOSIS — Z170 Estrogen receptor positive status [ER+]: Principal | ICD-10-CM

## 2017-10-12 LAB — COMPREHENSIVE METABOLIC PANEL
ALBUMIN: 4.3 g/dL (ref 3.5–5.0)
ALK PHOS: 66 U/L (ref 40–150)
ALT: 13 U/L (ref 0–55)
ANION GAP: 8 meq/L (ref 3–11)
AST: 17 U/L (ref 5–34)
BILIRUBIN TOTAL: 0.59 mg/dL (ref 0.20–1.20)
BUN: 11.4 mg/dL (ref 7.0–26.0)
CO2: 27 mEq/L (ref 22–29)
Calcium: 9.3 mg/dL (ref 8.4–10.4)
Chloride: 104 mEq/L (ref 98–109)
Creatinine: 0.8 mg/dL (ref 0.6–1.1)
Glucose: 101 mg/dl (ref 70–140)
POTASSIUM: 4 meq/L (ref 3.5–5.1)
SODIUM: 140 meq/L (ref 136–145)
TOTAL PROTEIN: 6.9 g/dL (ref 6.4–8.3)

## 2017-10-12 LAB — CBC WITH DIFFERENTIAL/PLATELET
BASO%: 0.5 % (ref 0.0–2.0)
Basophils Absolute: 0 10*3/uL (ref 0.0–0.1)
EOS%: 1.9 % (ref 0.0–7.0)
Eosinophils Absolute: 0.1 10*3/uL (ref 0.0–0.5)
HCT: 41.1 % (ref 34.8–46.6)
HEMOGLOBIN: 13.8 g/dL (ref 11.6–15.9)
LYMPH#: 1 10*3/uL (ref 0.9–3.3)
LYMPH%: 22.4 % (ref 14.0–49.7)
MCH: 32.3 pg (ref 25.1–34.0)
MCHC: 33.6 g/dL (ref 31.5–36.0)
MCV: 96.3 fL (ref 79.5–101.0)
MONO#: 0.3 10*3/uL (ref 0.1–0.9)
MONO%: 6.8 % (ref 0.0–14.0)
NEUT%: 68.4 % (ref 38.4–76.8)
NEUTROS ABS: 3.1 10*3/uL (ref 1.5–6.5)
PLATELETS: 210 10*3/uL (ref 145–400)
RBC: 4.26 10*6/uL (ref 3.70–5.45)
RDW: 12.8 % (ref 11.2–14.5)
WBC: 4.6 10*3/uL (ref 3.9–10.3)

## 2017-10-12 NOTE — Progress Notes (Signed)
ID: [name] DOB: [birth year]  MR#: [id number]  YTK#:[id number]  Patient Care Team: Lucille Passy, MD as PCP - General Coralie Keens, MD as Consulting Physician (General Surgery) Chauncey Cruel, MD as Consulting Physician (Oncology) Eppie Gibson, MD as Attending Physician (Radiation Oncology) Chauncey Cruel, MD OTHER MD:  CHIEF COMPLAINT: Estrogen receptor positive breast cancer  CURRENT TREATMENT: anastrozole   BREAST CANCER HISTORY: From the original intake note:  [name] (pronounced "le-veen") reports noticing a mass in her left breast for about a year but did not think to bring this to medical attention, thinking it might be a cyst. The mass was picked up on routine physical exam by Dr. Deborra Medina who set up the patient for bilateral screening mammography at the [facility] 08/10/2016. This showed the breast density to be category C. A possible mass was noted in the left breast and on 08/18/2016 she was recalled for left diagnostic mammography with tomography and left breast ultrasonography. This confirmed a 2.9 cm spiculated mass in the upper outer quadrant of the left breast. This was palpable at the 1:00 position 8 cm from the nipple. Ultrasonography found an irregular hypoechoic mass at this site measuring 3.5 cm. The left axilla was sonographically benign.  The patient was then set up for ultrasound-guided biopsy of the left breast mass 08/20/2016. At the time of this ultrasound multiple solid subcentimeter satellite nodules were noted in the upper-outer quadrant of the left breast and one of these was biopsied as well as the main mass. The distance between the clips is 5.0 cm. 4 The pathology from that procedure ([id number]) both showed invasive ductal carcinoma, grade 2, with similar prognostic panels. Specifically the estrogen receptor was 95% and the progesterone receptor 100% for both biopsies, both with strong staining intensity. The MIB-1 was between 10 and  15%. Both tumors were HER-2 negative, with a signals ratio between 0.78 and 1.06 and the number per cell between 1.45 and 1.80  The patient's subsequent history is as detailed below thank you   INTERVAL HISTORY: [name] returns today for follow-up and treatment of her estrogen receptor positive breast cancer.  She continues on anastrozole, with fair tolerance. She notes that she has intense hot flashes that occur during the day and night. She notes that the hot flashes occasionally prevent her from going to sleep. She notes that she always had trouble sleeping, and she doesn't sleep through the night. She notes that the gabapentin may help with the night sweats. She also reports arthralgias and myalgias that she notices more after exercise. She notes that her toes and feet burn after taking walks on her lunch break. She notes that her arms started hurting at night and notes discomfort particularly in the left arm that has gotten worse since Thanksgiving.   Since her last visit, she underwent diagnostic bilateral mammography with CAD and tomography on 09/13/2017 at [facility] showing: breast density category C. There is no evidence of malignancy.   REVIEW OF SYSTEMS: Saprina reports that she is retiring from her job May 1st this year. She is looking forward to this.  She reports that she had a rash from the neck to the knees in which she followed up with her PCP. She was placed on prednisone, and it took a month to go away. She notes that it came back and went away. She notes that it has not came back since. She denies unusual headaches, visual changes, nausea, vomiting, or dizziness. There  has been no unusual cough, phlegm production, or pleurisy. This been no change in bowel or bladder habits. She denies unexplained fatigue or unexplained weight loss, bleeding, rash, or fever. A detailed review of systems was otherwise stable.   Past Medical History:  Diagnosis Date  . Anxiety   . Breast  cancer ([place])   . Depression   . Dermatitis    allergic  . Foot pain, right   . History of radiation therapy 12/20/16- 01/17/17   Left Breast 40.05 Gy in 15 fractions and a 10 Gy boost in 5 fractions.   . Malignant neoplasm of upper-outer quadrant of left breast in female, estrogen receptor positive ([place]) 08/31/2016  . Need for prophylactic hormone replacement therapy (postmenopausal)   . Nocturia   . Routine general medical examination at a health care facility   . Symptomatic menopausal or female climacteric states   . Thoracic or lumbosacral neuritis or radiculitis, unspecified    Past Surgical History:  Procedure Laterality Date  . BREAST LUMPECTOMY Left   . BREAST LUMPECTOMY WITH SENTINEL LYMPH NODE BIOPSY Left 11/09/2016   Procedure: LEFT BREAST LUMPECTOMY WITH SENTINEL LYMPH NODE BX;  Surgeon: Coralie Keens, MD;  Location: [facility];  Service: General;  Laterality: Left;  . TONSILLECTOMY     age 53    No family history on file. The patient's father died from lung cancer in the setting of tobacco. Patient's mother died at age 46 with Alzheimer's is the patient has no brothers, 2 sisters. There is no history of breast or ovarian cancer in the family   GYNECOLOGIC HISTORY:  No LMP recorded. Patient is postmenopausal. Menarche age 19, the patient is GX P0. She stopped having periods approximately 2013. She has been on oral contraceptives more than 5 years, stopping in mid-November 2017.  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 Her husband Clair Gulling is self-employed in [employer] and runs a grown [employer]. At home is just the 2 of them plus a dog and 3 cats.                          ADVANCED DIRECTIVES: Not in place   Objective: Middle-aged white woman in no acute distress  Vitals:   10/12/17 1331  BP: (!) 143/94  Pulse: 77  Resp: 18  Temp: 98.4 F (36.9 C)  SpO2: 100%     Body  mass index is 22.34 kg/m.    ECOG FS:1 - Symptomatic but completely ambulatory Filed Weights   10/12/17 1331  Weight: 126 lb 1.6 oz (57.2 kg)   Sclerae unicteric, pupils round and equal Oropharynx clear and moist No cervical or supraclavicular adenopathy Lungs no rales or rhonchi Heart regular rate and rhythm Abd soft, nontender, positive bowel sounds MSK no focal spinal tenderness, no upper extremity lymphedema Neuro: nonfocal, well oriented, appropriate affect Breasts: The right breast is benign.  The left breast is status post lumpectomy and radiation with no evidence of local recurrence.  Both axillae are benign.  LAB RESULTS:  CMP     Component Value Date/Time   NA 140 10/12/2017 1302   K 4.0 10/12/2017 1302   CL 108 08/03/2016 0822   CO2 27 10/12/2017 1302   GLUCOSE 101 10/12/2017 1302   BUN 11.4 10/12/2017 1302   CREATININE 0.8 10/12/2017 1302   CALCIUM 9.3 10/12/2017 1302   PROT 6.9 10/12/2017  1302   ALBUMIN 4.3 10/12/2017 1302   AST 17 10/12/2017 1302   ALT 13 10/12/2017 1302   ALKPHOS 66 10/12/2017 1302   BILITOT 0.59 10/12/2017 1302   GFRNONAA >90 02/14/2013 0658   GFRAA >90 02/14/2013 0658    INo results found for: SPEP, UPEP  Lab Results  Component Value Date   WBC 4.6 10/12/2017   NEUTROABS 3.1 10/12/2017   HGB 13.8 10/12/2017   HCT 41.1 10/12/2017   MCV 96.3 10/12/2017   PLT 210 10/12/2017      Chemistry      Component Value Date/Time   NA 140 10/12/2017 1302   K 4.0 10/12/2017 1302   CL 108 08/03/2016 0822   CO2 27 10/12/2017 1302   BUN 11.4 10/12/2017 1302   CREATININE 0.8 10/12/2017 1302      Component Value Date/Time   CALCIUM 9.3 10/12/2017 1302   ALKPHOS 66 10/12/2017 1302   AST 17 10/12/2017 1302   ALT 13 10/12/2017 1302   BILITOT 0.59 10/12/2017 1302       No results found for: LABCA2  No components found for: LABCA125  No results for input(s): INR in the last 168 hours.  Urinalysis    Component Value Date/Time    COLORURINE YELLOW 02/14/2013 0703   APPEARANCEUR CLOUDY (A) 02/14/2013 0703   LABSPEC 1.018 02/14/2013 0703   PHURINE 8.0 02/14/2013 0703   GLUCOSEU NEGATIVE 02/14/2013 0703   HGBUR NEGATIVE 02/14/2013 0703   BILIRUBINUR NEGATIVE 02/14/2013 0703   KETONESUR 15 (A) 02/14/2013 0703   PROTEINUR NEGATIVE 02/14/2013 0703   UROBILINOGEN 0.2 02/14/2013 0703   NITRITE NEGATIVE 02/14/2013 0703   LEUKOCYTESUR NEGATIVE 02/14/2013 0703     STUDIES: Since her last visit, she underwent diagnostic bilateral mammography with CAD and tomography on 09/13/2017 at [facility] showing: breast density category C. There is no evidence of malignancy.  ELIGIBLE FOR AVAILABLE RESEARCH PROTOCOL: no  ASSESSMENT: 67 y.o.  woman status post left breast upper outer quadrant biopsy x2 on 08/20/2016 for a clinical mT2 N0, stage IIA invasive ductal carcinoma, both tumors showing identical prognostic panels: Estrogen and progesterone receptor strongly positive, HER-2 negative, with an MIB-1 of 10-15%   (1) neoadjuvant anastrozole started 09/10/2016 in anticipation possible surgical delays  (2) status post left lumpectomy and sentinel lymph node sampling 11/09/2016 for a mpT2 pN0, stage IB (2018 classification) invasive ductal carcinoma, with negative margins.  (3) Oncotype DX score of 25 predicts a risk of outside the breast recurrence of 17% if the patient's only systemic therapy is tamoxifen for 5 years. It predicts a risk reduction of approximately an additional 4% if chemotherapy is added.  (a) the patient opted against adjuvant chemotherapy  (4) adjuvant radiation  12/20/16 - 01/17/17 Site/dose:   Left Breast treated to 40.05 Gy in 15 fx of 2.67 Gy/fx  and a 10 Gy boost in 5 fx of 2 Gy/fx   (5) continue anastrozole a minimum of 5 years  (a) bone density 12/08/2016 showed a T score of -0.2 (normal).  PLAN: Rynlee is now a year out from definitive surgery for breast cancer with no evidence of  disease recurrence.  This is favorable.  She is having significant arthralgias and myalgias which I have to believe are going to be related to her last generally arthritis pains feel better when a person exercises on hers do the opposite.  I did let her know that her weight gain is likely not related.  I think the only  way to find out what is going on is to stop the anastrozole and were doing that now.  She is going to call me in about 6 weeks, on Bastille day, and let us know how her symptoms are doing.  If she continues exactly as now, than what were dealing with is arthritis and she will deal with that with yoga or tai chi and similar activities.  I suspect though that she will feel considerably better at that time.  In that case we will start tamoxifen.  Today we did discuss the possible toxicities side effects and complications of this agent and I will in that case start her on the drug and see her again in June to assess tolerance  She knows to call for any other issues that may develop before her next visit.  Thetis Schwimmer, Virgie Dad, MD  10/12/17 2:15 PM Medical Oncology and Hematology [facility] [address] [place], [place] [address] Tel. [phone]    Fax. [phone]  This document serves as a record of services personally performed by Lurline Del, MD. It was created on his behalf by Sheron Nightingale, a trained medical scribe. The creation of this record is based on the scribe's personal observations and the provider's statements to them.   I have reviewed the above documentation for accuracy and completeness, and I agree with the above.

## 2017-10-12 NOTE — Telephone Encounter (Signed)
Gave patient avs and calendar with appts per 1/2 los.  °

## 2017-11-28 ENCOUNTER — Telehealth: Payer: Self-pay | Admitting: *Deleted

## 2017-11-28 ENCOUNTER — Other Ambulatory Visit: Payer: Self-pay | Admitting: Oncology

## 2017-11-28 MED ORDER — TAMOXIFEN CITRATE 20 MG PO TABS: 20.0000 mg | ORAL_TABLET | Freq: Every day | ORAL | 12 refills | Status: AC

## 2017-11-28 NOTE — Telephone Encounter (Signed)
Kathy Bishop called to this RN - to give update per stopping arimidex approximately 6 weeks ago regarding possible side effects.  Kathy Bishop states symptoms are 90% resolved.  Verified current pharmacy as in [place].  Pt is scheduled for f/u in June 2019.  This note will be given to MD for further recommendations.  Return call number for pt is [phone]

## 2017-11-28 NOTE — Progress Notes (Signed)
We asked her to start tamoxifen on 11/28/2017

## 2018-02-23 ENCOUNTER — Other Ambulatory Visit: Payer: Self-pay | Admitting: Oncology

## 2018-03-17 NOTE — Progress Notes (Signed)
ID: Kathy Bishop DOB: 01-31-1951  MR#: [id number]  PJA#:[id number]  Patient Care Team: Lucille Passy, MD as PCP - General Coralie Keens, MD as Consulting Physician (General Surgery) Chauncey Cruel, MD as Consulting Physician (Oncology) Eppie Gibson, MD as Attending Physician (Radiation Oncology) Chauncey Cruel, MD OTHER MD:  CHIEF COMPLAINT: Estrogen receptor positive breast cancer  CURRENT TREATMENT: tamoxifen   BREAST CANCER HISTORY: From the original intake note:  Kathy Bishop (pronounced "le-veen") reports noticing a mass in her left breast for about a year but did not think to bring this to medical attention, thinking it might be a cyst. The mass was picked up on routine physical exam by Dr. Deborra Medina who set up the patient for bilateral screening mammography at the [facility] 08/10/2016. This showed the breast density to be category C. A possible mass was noted in the left breast and on 08/18/2016 she was recalled for left diagnostic mammography with tomography and left breast ultrasonography. This confirmed a 2.9 cm spiculated mass in the upper outer quadrant of the left breast. This was palpable at the 1:00 position 8 cm from the nipple. Ultrasonography found an irregular hypoechoic mass at this site measuring 3.5 cm. The left axilla was sonographically benign.  The patient was then set up for ultrasound-guided biopsy of the left breast mass 08/20/2016. At the time of this ultrasound multiple solid subcentimeter satellite nodules were noted in the upper-outer quadrant of the left breast and one of these was biopsied as well as the main mass. The distance between the clips is 5.0 cm. 4 The pathology from that procedure ([id number]) both showed invasive ductal carcinoma, grade 2, with similar prognostic panels. Specifically the estrogen receptor was 95% and the progesterone receptor 100% for both biopsies, both with strong staining intensity. The MIB-1 was between 10 and 15%.  Both tumors were HER-2 negative, with a signals ratio between 0.78 and 1.06 and the number per cell between 1.45 and 1.80  The patient's subsequent history is as detailed below thank you   INTERVAL HISTORY: Kathy Bishop returns today for follow-up and treatment of her estrogen receptor positive breast cancer. She developed significant arthralgias and myalgias on anastrozole, which greatly improved when this medication was discontinued.  Accordingly she was started on tamoxifen February 2019. She tolerates this well. She denies increased vaginal discharge. She has frequent hot flashes that occur several times per day and at night. She takes gabapentin, but this minimally helps. She wake sup frequently at night.    REVIEW OF SYSTEMS: Kathy Bishop reports that she retired 02/07/2018. She has been walking her dog, reading, and gardening. She joined the [employer]. She plans on doing water aerobics and other fitness classes. She denies unusual headaches, visual changes, nausea, vomiting, or dizziness. There has been no unusual cough, phlegm production, or pleurisy. This been no change in bowel or bladder habits. She denies unexplained fatigue or unexplained weight loss, bleeding, rash, or fever. A detailed review of systems was otherwise stable.    Past Medical History:  Diagnosis Date  . Anxiety   . Breast cancer ([place])   . Depression   . Dermatitis    allergic  . Foot pain, right   . History of radiation therapy 12/20/16- 01/17/17   Left Breast 40.05 Gy in 15 fractions and a 10 Gy boost in 5 fractions.   . Malignant neoplasm of upper-outer quadrant of left breast in female, estrogen receptor positive ([place]) 08/31/2016  . Need for  prophylactic hormone replacement therapy (postmenopausal)   . Nocturia   . Routine general medical examination at a health care facility   . Symptomatic menopausal or female climacteric states   . Thoracic or lumbosacral neuritis or radiculitis, unspecified     Past Surgical History:  Procedure Laterality Date  . BREAST LUMPECTOMY Left   . BREAST LUMPECTOMY WITH SENTINEL LYMPH NODE BIOPSY Left 11/09/2016   Procedure: LEFT BREAST LUMPECTOMY WITH SENTINEL LYMPH NODE BX;  Surgeon: Coralie Keens, MD;  Location: [facility];  Service: General;  Laterality: Left;  . TONSILLECTOMY     age 67    No family history on file. The patient's father died from lung cancer in the setting of tobacco. Patient's mother died at age 67 with Alzheimer's is the patient has no brothers, 2 sisters. There is no history of breast or ovarian cancer in the family   GYNECOLOGIC HISTORY:  No LMP recorded. Patient is postmenopausal. Menarche age 27, the patient is GX P0. She stopped having periods approximately 2013. She has been on oral contraceptives more than 5 years, stopping in mid-November 2017.  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  She retired April 2019.  Her husband Kathy Bishop is self-employed in [employer] and runs a grown [employer]. At home is just the 2 of them plus a dog and 3 cats.                          ADVANCED DIRECTIVES: Not in place   Objective: Middle-aged white woman who appears well  Vitals:   03/20/18 0915  BP: 131/83  Pulse: 60  Resp: 18  Temp: 99 F (37.2 C)  SpO2: 95%     Body mass index is 21.68 kg/m.    ECOG FS:0 - Asymptomatic Filed Weights   03/20/18 0915  Weight: 122 lb 6.4 oz (55.5 kg)   Sclerae unicteric, EOMs intact Oropharynx clear and moist No cervical or supraclavicular adenopathy Lungs no rales or rhonchi Heart regular rate and rhythm Abd soft, nontender, positive bowel sounds MSK no focal spinal tenderness, no upper extremity lymphedema Neuro: nonfocal, well oriented, appropriate affect Breasts: Right breast is unremarkable.  The left breast status post lumpectomy and radiation.  There is mild distortion of the breast  contour.  There is no evidence of local recurrence.  Both axillae are benign.  LAB RESULTS:  CMP     Component Value Date/Time   NA 140 10/12/2017 1302   K 4.0 10/12/2017 1302   CL 108 08/03/2016 0822   CO2 27 10/12/2017 1302   GLUCOSE 101 10/12/2017 1302   BUN 11.4 10/12/2017 1302   CREATININE 0.8 10/12/2017 1302   CALCIUM 9.3 10/12/2017 1302   PROT 6.9 10/12/2017 1302   ALBUMIN 4.3 10/12/2017 1302   AST 17 10/12/2017 1302   ALT 13 10/12/2017 1302   ALKPHOS 66 10/12/2017 1302   BILITOT 0.59 10/12/2017 1302   GFRNONAA >90 02/14/2013 0658   GFRAA >90 02/14/2013 0658    INo results found for: SPEP, UPEP  Lab Results  Component Value Date   WBC 3.8 (L) 03/20/2018   NEUTROABS 2.3 03/20/2018   HGB 12.8 03/20/2018   HCT 37.8 03/20/2018   MCV 97.1 03/20/2018   PLT 166 03/20/2018      Chemistry      Component Value Date/Time   NA 140 10/12/2017 1302  K 4.0 10/12/2017 1302   CL 108 08/03/2016 0822   CO2 27 10/12/2017 1302   BUN 11.4 10/12/2017 1302   CREATININE 0.8 10/12/2017 1302      Component Value Date/Time   CALCIUM 9.3 10/12/2017 1302   ALKPHOS 66 10/12/2017 1302   AST 17 10/12/2017 1302   ALT 13 10/12/2017 1302   BILITOT 0.59 10/12/2017 1302       No results found for: LABCA2  No components found for: LABCA125  No results for input(s): INR in the last 168 hours.  Urinalysis    Component Value Date/Time   COLORURINE YELLOW 02/14/2013 0703   APPEARANCEUR CLOUDY (A) 02/14/2013 0703   LABSPEC 1.018 02/14/2013 0703   PHURINE 8.0 02/14/2013 0703   GLUCOSEU NEGATIVE 02/14/2013 0703   HGBUR NEGATIVE 02/14/2013 0703   BILIRUBINUR NEGATIVE 02/14/2013 0703   KETONESUR 15 (A) 02/14/2013 0703   PROTEINUR NEGATIVE 02/14/2013 0703   UROBILINOGEN 0.2 02/14/2013 0703   NITRITE NEGATIVE 02/14/2013 0703   LEUKOCYTESUR NEGATIVE 02/14/2013 0703     STUDIES: Next mammography due late November  ELIGIBLE FOR AVAILABLE RESEARCH PROTOCOL: no  ASSESSMENT: 68  y.o.  woman status post left breast upper outer quadrant biopsy x2 on 08/20/2016 for a clinical mT2 N0, stage IIA invasive ductal carcinoma, both tumors showing identical prognostic panels: Estrogen and progesterone receptor strongly positive, HER-2 negative, with an MIB-1 of 10-15%   (1) neoadjuvant anastrozole started 09/10/2016 in anticipation possible surgical delays  (2) status post left lumpectomy and sentinel lymph node sampling 11/09/2016 for a mpT2 pN0, stage IB (2018 classification) invasive ductal carcinoma, with negative margins.  (3) Oncotype DX score of 25 predicts a risk of outside the breast recurrence of 17% if the patient's only systemic therapy is tamoxifen for 5 years. It predicts a risk reduction of approximately an additional 4% if chemotherapy is added.  (a) the patient opted against adjuvant chemotherapy  (4) adjuvant radiation  12/20/16 - 01/17/17 Site/dose:   Left Breast treated to 40.05 Gy in 15 fx of 2.67 Gy/fx  and a 10 Gy boost in 5 fx of 2 Gy/fx   (5) anastrozole started January 2018, stopped January 2019 with arthralgias/myalgias(  (a) bone density 12/08/2016 showed a T score of -0.2 (normal).  (6) tamoxifen started 11/28/2017  PLAN: Bettylou is now approximately a year and a half out from definitive surgery for her breast cancer with no evidence of disease recurrence.  This is very favorable.  She is tolerating tamoxifen better, the problem being hot flashes.  She is already on nighttime gabapentin.  That is helping some.  We are going to add venlafaxine at very low dose.  If that does not work for her she will double the dose to 75 mg daily.  If that does not work then that drug will not be helpful to her.  Otherwise she will continue on tamoxifen until December 2022  She will return to see me early December of this year, after her November mammography  She knows to call for any other issues that may develop before the next visit.   Kolston Lacount,  Virgie Dad, MD  03/20/18 9:48 AM Medical Oncology and Hematology [facility] [address] [place], [place] [address] Tel. [phone]    Fax. [phone]  Alice Rieger, am acting as scribe for Chauncey Cruel MD.  I, Lurline Del MD, have reviewed the above documentation for accuracy and completeness, and I agree with the above.

## 2018-03-20 ENCOUNTER — Telehealth: Payer: Self-pay | Admitting: Oncology

## 2018-03-20 ENCOUNTER — Inpatient Hospital Stay: Payer: PPO

## 2018-03-20 ENCOUNTER — Inpatient Hospital Stay: Payer: PPO | Attending: Oncology | Admitting: Oncology

## 2018-03-20 VITALS — BP 131/83 | HR 60 | Temp 99.0°F | Resp 18 | Ht 63.0 in | Wt 122.4 lb

## 2018-03-20 DIAGNOSIS — Z170 Estrogen receptor positive status [ER+]: Secondary | ICD-10-CM

## 2018-03-20 DIAGNOSIS — Z79810 Long term (current) use of selective estrogen receptor modulators (SERMs): Secondary | ICD-10-CM | POA: Insufficient documentation

## 2018-03-20 DIAGNOSIS — C50412 Malignant neoplasm of upper-outer quadrant of left female breast: Secondary | ICD-10-CM | POA: Diagnosis not present

## 2018-03-20 DIAGNOSIS — Z79899 Other long term (current) drug therapy: Secondary | ICD-10-CM | POA: Insufficient documentation

## 2018-03-20 DIAGNOSIS — Z923 Personal history of irradiation: Secondary | ICD-10-CM | POA: Diagnosis not present

## 2018-03-20 DIAGNOSIS — R232 Flushing: Secondary | ICD-10-CM | POA: Diagnosis not present

## 2018-03-20 DIAGNOSIS — F418 Other specified anxiety disorders: Secondary | ICD-10-CM | POA: Diagnosis not present

## 2018-03-20 DIAGNOSIS — Z801 Family history of malignant neoplasm of trachea, bronchus and lung: Secondary | ICD-10-CM | POA: Diagnosis not present

## 2018-03-20 LAB — CBC WITH DIFFERENTIAL/PLATELET
BASOS ABS: 0 10*3/uL (ref 0.0–0.1)
BASOS PCT: 1 %
Eosinophils Absolute: 0.1 10*3/uL (ref 0.0–0.5)
Eosinophils Relative: 2 %
HEMATOCRIT: 37.8 % (ref 34.8–46.6)
Hemoglobin: 12.8 g/dL (ref 11.6–15.9)
LYMPHS PCT: 28 %
Lymphs Abs: 1.1 10*3/uL (ref 0.9–3.3)
MCH: 32.8 pg (ref 25.1–34.0)
MCHC: 33.8 g/dL (ref 31.5–36.0)
MCV: 97.1 fL (ref 79.5–101.0)
MONO ABS: 0.3 10*3/uL (ref 0.1–0.9)
Monocytes Relative: 8 %
NEUTROS ABS: 2.3 10*3/uL (ref 1.5–6.5)
NEUTROS PCT: 61 %
Platelets: 166 10*3/uL (ref 145–400)
RBC: 3.89 MIL/uL (ref 3.70–5.45)
RDW: 12.9 % (ref 11.2–14.5)
WBC: 3.8 10*3/uL — ABNORMAL LOW (ref 3.9–10.3)

## 2018-03-20 LAB — COMPREHENSIVE METABOLIC PANEL
ALBUMIN: 4 g/dL (ref 3.5–5.0)
ALK PHOS: 42 U/L (ref 40–150)
ALT: 16 U/L (ref 0–55)
ANION GAP: 7 (ref 3–11)
AST: 17 U/L (ref 5–34)
BILIRUBIN TOTAL: 0.5 mg/dL (ref 0.2–1.2)
BUN: 13 mg/dL (ref 7–26)
CALCIUM: 9.1 mg/dL (ref 8.4–10.4)
CO2: 28 mmol/L (ref 22–29)
Chloride: 106 mmol/L (ref 98–109)
Creatinine, Ser: 0.82 mg/dL (ref 0.60–1.10)
Glucose, Bld: 81 mg/dL (ref 70–140)
POTASSIUM: 4 mmol/L (ref 3.5–5.1)
Sodium: 141 mmol/L (ref 136–145)
TOTAL PROTEIN: 6.3 g/dL — AB (ref 6.4–8.3)

## 2018-03-20 MED ORDER — VENLAFAXINE HCL ER 37.5 MG PO CP24: 37.5000 mg | ORAL_CAPSULE | Freq: Every day | ORAL | 4 refills | Status: DC

## 2018-03-20 NOTE — Telephone Encounter (Signed)
Gave avs and calendar ° °

## 2018-04-25 ENCOUNTER — Other Ambulatory Visit: Payer: Self-pay | Admitting: Family Medicine

## 2018-05-22 ENCOUNTER — Other Ambulatory Visit: Payer: Self-pay | Admitting: *Deleted

## 2018-05-22 MED ORDER — VENLAFAXINE HCL ER 37.5 MG PO CP24: 75.0000 mg | ORAL_CAPSULE | Freq: Every day | ORAL | 4 refills | Status: DC

## 2018-05-23 ENCOUNTER — Other Ambulatory Visit: Payer: Self-pay | Admitting: Oncology

## 2018-06-10 ENCOUNTER — Other Ambulatory Visit: Payer: Self-pay | Admitting: Oncology

## 2018-07-20 ENCOUNTER — Other Ambulatory Visit: Payer: Self-pay | Admitting: Family Medicine

## 2018-07-20 NOTE — Telephone Encounter (Signed)
TA-Not sure if refill is appropriate since you have not seen since 2017 but I do see that pt is being seen by Oncology quite a lot/plz advise/thx dmf

## 2018-08-22 ENCOUNTER — Other Ambulatory Visit: Payer: Self-pay | Admitting: Oncology

## 2018-08-22 DIAGNOSIS — Z853 Personal history of malignant neoplasm of breast: Secondary | ICD-10-CM

## 2018-08-25 ENCOUNTER — Ambulatory Visit: Payer: Self-pay | Admitting: *Deleted

## 2018-08-25 NOTE — Telephone Encounter (Signed)
Pt called with complaints of rectal bleeding that started 11/ /19; she saw blood when she wiped and blood on the surface of the stool; the pt says that she is unable to been in the office today, and requests an appointment any day next week except Thursday; recommendations made per nurse triage protocol; pt offered and accepted appointment with Dr Deborra Medina, Audrie Lia, 11/ 19/14/ at 1040; she verbalized understanding; will route to office for notification of this upcoming appointment.    Reason for Disposition . MILD rectal bleeding (more than just a few drops or streaks)  Answer Assessment - Initial Assessment Questions 1. APPEARANCE of BLOOD: "What color is it?" "Is it passed separately, on the surface of the stool, or mixed in with the stool?"      Bright red on tissue and on the surface of stool 2. AMOUNT: "How much blood was passed?"      Drops of blood coming out of rectum  3. FREQUENCY: "How many times has blood been passed with the stools?"      Almost every time she has a bowel movement 4. ONSET: "When was the blood first seen in the stools?" (Days or weeks)      08/15/18 5. DIARRHEA: "Is there also some diarrhea?" If so, ask: "How many diarrhea stools were passed in past 24 hours?"      no 6. CONSTIPATION: "Do you have constipation?" If so, "How bad is it?"     no 7. RECURRENT SYMPTOMS: "Have you had blood in your stools before?" If so, ask: "When was the last time?" and "What happened that time?"      Yes tiny specks intermittently for the past few years 8. BLOOD THINNERS: "Do you take any blood thinners?" (e.g., Coumadin/warfarin, Pradaxa/dabigatran, aspirin)    no 9. OTHER SYMPTOMS: "Do you have any other symptoms?"  (e.g., abdominal pain, vomiting, dizziness, fever)     no 10. PREGNANCY: "Is there any chance you are pregnant?" "When was your last menstrual period?"       no  Protocols used: RECTAL BLEEDING-A-AH

## 2018-08-29 ENCOUNTER — Ambulatory Visit: Payer: PPO | Admitting: Family Medicine

## 2018-08-30 ENCOUNTER — Other Ambulatory Visit: Payer: PPO

## 2018-08-30 ENCOUNTER — Encounter: Payer: Self-pay | Admitting: Family Medicine

## 2018-08-30 ENCOUNTER — Ambulatory Visit (INDEPENDENT_AMBULATORY_CARE_PROVIDER_SITE_OTHER): Payer: PPO | Admitting: Family Medicine

## 2018-08-30 VITALS — BP 136/84 | HR 78 | Temp 98.3°F | Ht 63.75 in | Wt 116.4 lb

## 2018-08-30 DIAGNOSIS — K625 Hemorrhage of anus and rectum: Secondary | ICD-10-CM | POA: Diagnosis not present

## 2018-08-30 DIAGNOSIS — R110 Nausea: Secondary | ICD-10-CM | POA: Diagnosis not present

## 2018-08-30 DIAGNOSIS — R634 Abnormal weight loss: Secondary | ICD-10-CM | POA: Diagnosis not present

## 2018-08-30 DIAGNOSIS — Z170 Estrogen receptor positive status [ER+]: Secondary | ICD-10-CM

## 2018-08-30 DIAGNOSIS — C50412 Malignant neoplasm of upper-outer quadrant of left female breast: Secondary | ICD-10-CM | POA: Diagnosis not present

## 2018-08-30 LAB — COMPREHENSIVE METABOLIC PANEL
ALT: 12 U/L (ref 0–35)
AST: 15 U/L (ref 0–37)
Albumin: 4.6 g/dL (ref 3.5–5.2)
Alkaline Phosphatase: 38 U/L — ABNORMAL LOW (ref 39–117)
BILIRUBIN TOTAL: 0.6 mg/dL (ref 0.2–1.2)
BUN: 12 mg/dL (ref 6–23)
CO2: 28 meq/L (ref 19–32)
Calcium: 9.5 mg/dL (ref 8.4–10.5)
Chloride: 104 mEq/L (ref 96–112)
Creatinine, Ser: 0.73 mg/dL (ref 0.40–1.20)
GFR: 84.55 mL/min (ref >60.00)
GLUCOSE: 92 mg/dL (ref 70–99)
POTASSIUM: 4.6 meq/L (ref 3.5–5.1)
Sodium: 140 mEq/L (ref 135–145)
Total Protein: 7 g/dL (ref 6.0–8.3)

## 2018-08-30 LAB — CBC WITH DIFFERENTIAL/PLATELET
BASOS PCT: 0.6 % (ref 0.0–3.0)
Basophils Absolute: 0 10*3/uL (ref 0.0–0.1)
EOS PCT: 1 % (ref 0.0–5.0)
Eosinophils Absolute: 0 10*3/uL (ref 0.0–0.7)
HCT: 42.2 % (ref 36.0–46.0)
Hemoglobin: 14.2 g/dL (ref 12.0–15.0)
LYMPHS ABS: 1 10*3/uL (ref 0.7–4.0)
Lymphocytes Relative: 26.5 % (ref 12.0–46.0)
MCHC: 33.8 g/dL (ref 30.0–36.0)
MCV: 98.2 fl (ref 78.0–100.0)
MONO ABS: 0.3 10*3/uL (ref 0.1–1.0)
MONOS PCT: 6.9 % (ref 3.0–12.0)
NEUTROS ABS: 2.5 10*3/uL (ref 1.4–7.7)
NEUTROS PCT: 65 % (ref 43.0–77.0)
PLATELETS: 205 10*3/uL (ref 150.0–400.0)
RBC: 4.3 Mil/uL (ref 3.87–5.11)
RDW: 12.8 % (ref 11.5–15.5)
WBC: 3.8 10*3/uL — ABNORMAL LOW (ref 4.0–10.5)

## 2018-08-30 LAB — LIPASE: Lipase: 7 U/L — ABNORMAL LOW (ref 11.0–59.0)

## 2018-08-30 LAB — H. PYLORI ANTIBODY, IGG: H PYLORI IGG: NEGATIVE

## 2018-08-30 MED ORDER — PROMETHAZINE HCL 25 MG PO TABS: 25.0000 mg | ORAL_TABLET | Freq: Three times a day (TID) | ORAL | 0 refills | Status: DC | PRN

## 2018-08-30 MED ORDER — HYDROXYZINE HCL 10 MG PO TABS: 10.0000 mg | ORAL_TABLET | Freq: Three times a day (TID) | ORAL | 5 refills | Status: DC | PRN

## 2018-08-30 MED ORDER — ONDANSETRON HCL 4 MG PO TABS: 4.0000 mg | ORAL_TABLET | Freq: Three times a day (TID) | ORAL | 0 refills | Status: DC | PRN

## 2018-08-30 NOTE — Assessment & Plan Note (Signed)
Persistent, daily for 2 months.  Unclear etiology but it is concerning.  She questions if it is from the tamoxifen and certainly that could be a side effect.  I am more concerned that she has been on tamoxifen since last year and these symptoms just started two months ago.  Refer stat to GI for endoscopy/colonoscopy. Stat labs today. eRxs sent for as needed zofran (advised this is less sedation and not as strong) and phenergan as needed for severe nausea.  She is aware to not take them together and not take phenergan if she has to drive or operate any machinery. The patient indicates understanding of these issues and agrees with the plan. Orders Placed This Encounter  Procedures  . CBC with Differential/Platelet  . H. pylori antibody, IgG  . Comprehensive metabolic panel  . Lipase  . Ambulatory referral to Gastroenterology

## 2018-08-30 NOTE — Progress Notes (Signed)
Subjective:   Patient ID: Kathy Bishop, female    DOB: 03-11-1951, 67 y.o.   MRN: [id number]  Kathy Bishop is a pleasant 67 y.o. year old female who presents to clinic today with Hematochezia (Patient is here today C/O hematochezia.  It happened on 11.6.19 but not since.  Denies any constipation or straining during BM that she can think of.  She states that she has had bowel issues for years.  Has had blood in stool before but on the 6th the blood was dripping and has never had that happen before. Denies taking NSAID's on a regular basis. Last Colonoscopy was over 20-years-ago when she was living in [place]. She states that she is nauseated all the time and is unsure if it is related to her CA med) and addendum (She sees Oncologist in December and will be able to discuss the Tamoxifen at that time if the nausea is related to that.  She declines flu and PNV vaccines.)  on 08/30/2018  HPI:  Rectal bleeding- actually had not yet had a BM when it happened.  It  happened only one time on 08/16/18.  Denies constipation or straining during BM. BMs have not been painful.  No black stools. Has had some blood in stool before but never like on 08/16/18- it was dripping into the water. No black stools.   Denies taking NSAIDs regularly. No abdominal pain.  No diarrhea or constipation.  She is on Tamoxifen for breast CA. Started Tamoxifen on 11/28/17- was on Arimidex prior to that but it caused myalgias.   Followed by Dr .Gwenlyn Perking. Was last seen by him on 03/20/18- note reviewed.  Has another appointment scheduled with him next month, mammogram scheduled for next month as well (09/14/18).  Last colonoscopy was over 20 years ago.  Has been having constant nausea for 2 months.  No vomiting. Has lost 8 pounds since June 2019 but she feels she is eating better and exercising more.  She actually wants to eat all the time because food is the only thing that makes the nausea better.  Lab Results  Component Value Date   WBC 3.8 (L) 03/20/2018   HGB 12.8 03/20/2018   HCT 37.8 03/20/2018   MCV 97.1 03/20/2018   PLT 166 03/20/2018     Wt Readings from Last 3 Encounters:  08/30/18 116 lb 6.4 oz (52.8 kg)  03/20/18 122 lb 6.4 oz (55.5 kg)  10/12/17 126 lb 1.6 oz (57.2 kg)    Current Outpatient Medications on File Prior to Visit  Medication Sig Dispense Refill  . escitalopram (LEXAPRO) 20 MG tablet TAKE 1 AND 1/2 TABLETS DAILY 135 tablet 0  . gabapentin (NEURONTIN) 300 MG capsule TAKE 1 CAPSULE (300 MG TOTAL) BY MOUTH AT BEDTIME. 90 capsule 4  . tamoxifen (NOLVADEX) 20 MG tablet Take 20 mg by mouth daily.     No current facility-administered medications on file prior to visit.     No Known Allergies  Past Medical History:  Diagnosis Date  . Anxiety   . Breast cancer ([place])   . Depression   . Dermatitis    allergic  . Foot pain, right   . History of radiation therapy 12/20/16- 01/17/17   Left Breast 40.05 Gy in 15 fractions and a 10 Gy boost in 5 fractions.   . Malignant neoplasm of upper-outer quadrant of left breast in female, estrogen receptor positive ([place]) 08/31/2016  . Need for prophylactic hormone replacement therapy (postmenopausal)   . Nocturia   .  Routine general medical examination at a health care facility   . Symptomatic menopausal or female climacteric states   . Thoracic or lumbosacral neuritis or radiculitis, unspecified     Past Surgical History:  Procedure Laterality Date  . BREAST LUMPECTOMY Left   . BREAST LUMPECTOMY WITH SENTINEL LYMPH NODE BIOPSY Left 11/09/2016   Procedure: LEFT BREAST LUMPECTOMY WITH SENTINEL LYMPH NODE BX;  Surgeon: Coralie Keens, MD;  Location: [facility];  Service: General;  Laterality: Left;  . TONSILLECTOMY     age 38    No family history on file.  Social History   Socioeconomic History  . Marital status: Married    Spouse name: Not on file  . Number of children: 0  . Years of education: Not on file  . 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 Financial resource strain: Not on file  . Food insecurity:    Worry: Not on file    Inability: Not on file  . Transportation needs:    Medical: Not on file    Non-medical: Not on file  Tobacco Use  . Smoking status: Former Research scientist (life sciences)  . Smokeless tobacco: Never Used  . Tobacco comment: smoked years ago in her 55's socially  Substance and Sexual Activity  . Alcohol use: Yes    Comment: social  . Drug use: No  . Sexual activity: Not on file  Lifestyle  . Physical activity:    Days per week: Not on file    Minutes per session: Not on file  . Stress: Not on file  Relationships  . Social connections:    Talks on phone: Not on file    Gets together: Not on file    Attends religious service: Not on file    Active member of club or organization: Not on file    Attends meetings of clubs or organizations: Not on file    Relationship status: Not on file  . Intimate partner violence:    Fear of current or ex partner: Not on file    Emotionally abused: Not on file    Physically abused: Not on file    Forced sexual activity: Not on file  Other Topics Concern  . Not on file  Social History Narrative  . Not on file   The PMH, PSH, Social History, Family History, Medications, and allergies have been reviewed in [facility], and have been updated if relevant.    Review of Systems  Constitutional: Positive for unexpected weight change. Negative for appetite change.  Eyes: Negative.   Respiratory: Negative.   Cardiovascular: Negative.   Gastrointestinal: Positive for anal bleeding, blood in stool and nausea. Negative for abdominal distention, abdominal pain, constipation, diarrhea, rectal pain and vomiting.  Endocrine: Positive for heat intolerance.  Genitourinary: Negative.   Musculoskeletal: Negative.   Skin: Negative.   Allergic/Immunologic: Negative.    Neurological: Negative.   Hematological: Negative.   Psychiatric/Behavioral: Negative.        Objective:    BP 136/84 (BP Location: Left Arm, Patient Position: Sitting, Cuff Size: Normal)   Pulse 78   Temp 98.3 F (36.8 C) (Oral)   Ht 5' 3.75" (1.619 m)   Wt 116 lb 6.4 oz (52.8 kg)   SpO2 98%   BMI 20.14 kg/m    Physical Exam  Constitutional: She is oriented to person, place, and  time. She appears well-developed and well-nourished. No distress.  HENT:  Head: Normocephalic and atraumatic.  Eyes: EOM are normal.  Neck: Normal range of motion.  Cardiovascular: Normal rate.  Pulmonary/Chest: Effort normal.  Abdominal: Soft. Bowel sounds are normal. She exhibits no distension and no mass. There is no tenderness. There is no rebound and no guarding. No hernia.  Genitourinary: Rectal exam shows no external hemorrhoid and no fissure.  Musculoskeletal: Normal range of motion. She exhibits no edema.  Neurological: She is alert and oriented to person, place, and time. No cranial nerve deficit.  Skin: Skin is warm and dry. She is not diaphoretic.  Psychiatric: She has a normal mood and affect. Her behavior is normal. Judgment and thought content normal.  Nursing note and vitals reviewed.         Assessment & Plan:   Rectal bleeding - Plan: CBC with Differential/Platelet, H. pylori antibody, IgG, Comprehensive metabolic panel, Lipase, Ambulatory referral to Gastroenterology  Unintended weight loss - Plan: CBC with Differential/Platelet, H. pylori antibody, IgG, Comprehensive metabolic panel, Lipase, Ambulatory referral to Gastroenterology  Nausea - Plan: CBC with Differential/Platelet, H. pylori antibody, IgG  Malignant neoplasm of upper-outer quadrant of left breast in female, estrogen receptor positive ([place]) No follow-ups on file.

## 2018-08-30 NOTE — Assessment & Plan Note (Signed)
>  40 minutes spent in face to face time with patient, >50% spent in counselling or coordination of care. One episode of quite significant, yet painless rectal bleeding.  Unlike a fissure as it was painless and no external hemorrhoids on exam.   Complicated situation given her breast cancer history, persistent nausea, unintentional weight loss.  Bleeding certainly could be coming from an internal hemorrhoid however the daily nausea and weight loss are concerning- she is long overdue for a colonoscopy.  Will refer urgently to GI for colonoscopy/endoscopy. Check CBC.

## 2018-08-30 NOTE — Patient Instructions (Signed)
It was great to see you, Kathy Bishop. I will call you with your lab results from today. Please stop by on your way out to discuss your GI referral.  I have sent in two nausea medications to your pharmacy- Zofran (ondansetron)- which is not as strong or sedating as the other- phenergan (promethazine) like we discussed today.

## 2018-08-31 ENCOUNTER — Telehealth: Payer: Self-pay

## 2018-08-31 NOTE — Telephone Encounter (Signed)
PEC-ok to give lab results and collect information that Dr. Deborra Medina is requesting  Overall her labs look good but her Lipase is a bit low which can indicate pancreatic insufficiency or chronic pancreatitis which would explain some of her nausea and weight loss/Dr. Deborra Medina would like to order a CT of her abdomen prior to her going to GI and we need to know if the patient is ok with this order being placed/plz advise/thx dmf

## 2018-08-31 NOTE — Telephone Encounter (Signed)
-----   Message from Lucille Passy, MD sent at 08/30/2018  7:46 PM EST ----- Please call pt- overall her labs look good.  Her lipase was a bit low which can indicate pancreatic insufficiency or chronic pancreatitis.  This could explain some her nausea and weight loss.  I would like to order a CT of her abdomen before she sees GI.  Please let me know if she is okay with this and I will place order.

## 2018-08-31 NOTE — Telephone Encounter (Signed)
Pt called to get lab results °

## 2018-09-01 ENCOUNTER — Other Ambulatory Visit: Payer: Self-pay | Admitting: *Deleted

## 2018-09-01 NOTE — Telephone Encounter (Signed)
Pt returning phone call and Pt given lab results per notes of Dr. Deborra Medina on 08/30/18. Pt verbalized understanding. Pt is agreeable to have CT of abdomen. Pt states that she will be out of town next week and also has previously scheduled appts on Dec. 4th and 5th. Pt states she will be available any time for the CT other than the dates mentioned above.

## 2018-09-01 NOTE — Telephone Encounter (Signed)
Yes.  Can she tolerate meclizine?  If so , okay to end in rx as entered and pended below.

## 2018-09-01 NOTE — Telephone Encounter (Signed)
Submitted PA for RX Hydroxyzine HCL 10 mg-PA was denied-called [facility] and even for a non-formulary PA would be required. DX was BPV. Do you want to send an alternate RX? Please advise

## 2018-09-01 NOTE — Telephone Encounter (Signed)
Pt returning call to receive lab results.

## 2018-09-04 ENCOUNTER — Ambulatory Visit: Payer: Self-pay | Admitting: *Deleted

## 2018-09-04 MED ORDER — MECLIZINE HCL 25 MG PO TABS: 25.0000 mg | ORAL_TABLET | Freq: Three times a day (TID) | ORAL | 0 refills | Status: DC | PRN

## 2018-09-04 NOTE — Telephone Encounter (Signed)
Meclizine to take place of Hydroxyzine-Patient aware to take as needed, if she can tolerate. PA approved for Promethazine. Patient verbalized understanding.

## 2018-09-04 NOTE — Telephone Encounter (Signed)
Pt calling for clarification regarding medications. Pt questioning why meclizine was called in to pharmacy.  Reviewed note from [facility] and Dr. Deborra Medina as hydroxyzine was denied.  Pt states pharmacy only filled the Zofran, not the promethazine. Questioning if Meclizine is taking the place of promethazine. Please advise: [phone]  Reason for Disposition . Caller has NON-URGENT medication question about med that PCP prescribed and triager unable to answer question  Answer Assessment - Initial Assessment Questions 1. SYMPTOMS: "Do you have any symptoms?"     no 2. SEVERITY: If symptoms are present, ask "Are they mild, moderate or severe?"     No   Question re: new meds for nausea  Protocols used: MEDICATION QUESTION CALL-A-AH

## 2018-09-12 ENCOUNTER — Other Ambulatory Visit: Payer: Self-pay | Admitting: Family Medicine

## 2018-09-12 DIAGNOSIS — R110 Nausea: Secondary | ICD-10-CM

## 2018-09-12 DIAGNOSIS — R634 Abnormal weight loss: Secondary | ICD-10-CM

## 2018-09-12 DIAGNOSIS — R748 Abnormal levels of other serum enzymes: Secondary | ICD-10-CM

## 2018-09-12 NOTE — Progress Notes (Signed)
ID: Kathy Bishop DOB: 19-May-1951  MR#: [id number]  JOI#:[id number]  Patient Care Team: Lucille Passy, MD as PCP - General Coralie Keens, MD as Consulting Physician (General Surgery) Chauncey Cruel, MD as Consulting Physician (Oncology) Eppie Gibson, MD as Attending Physician (Radiation Oncology) Chauncey Cruel, MD OTHER MD:  CHIEF COMPLAINT: Estrogen receptor positive breast cancer  CURRENT TREATMENT: tamoxifen   BREAST CANCER HISTORY: From the original intake note:  Kathy Bishop (pronounced "le-veen") reports noticing a mass in her left breast for about a year but did not think to bring this to medical attention, thinking it might be a cyst. The mass was picked up on routine physical exam by Dr. Deborra Medina who set up the patient for bilateral screening mammography at the [facility] 08/10/2016. This showed the breast density to be category C. A possible mass was noted in the left breast and on 08/18/2016 she was recalled for left diagnostic mammography with tomography and left breast ultrasonography. This confirmed a 2.9 cm spiculated mass in the upper outer quadrant of the left breast. This was palpable at the 1:00 position 8 cm from the nipple. Ultrasonography found an irregular hypoechoic mass at this site measuring 3.5 cm. The left axilla was sonographically benign.  The patient was then set up for ultrasound-guided biopsy of the left breast mass 08/20/2016. At the time of this ultrasound multiple solid subcentimeter satellite nodules were noted in the upper-outer quadrant of the left breast and one of these was biopsied as well as the main mass. The distance between the clips is 5.0 cm. 4 The pathology from that procedure ([id number]) both showed invasive ductal carcinoma, grade 2, with similar prognostic panels. Specifically the estrogen receptor was 95% and the progesterone receptor 100% for both biopsies, both with strong staining intensity. The MIB-1 was between 10 and 15%.  Both tumors were HER-2 negative, with a signals ratio between 0.78 and 1.06 and the number per cell between 1.45 and 1.80  The patient's subsequent history is as detailed below thank you   INTERVAL HISTORY: Kathy Bishop returns today for follow-up for her estrogen receptor positive breast cancer.   The patient continues on tamoxifen, which she is tolerating well. She has began to have hot flashes during the day after taking herself off of venlafaxine (because she was having extreme diarrhea and insomnia which resolved when she went came off that medication). Her nighttime hot flashes have gotten better since she started gabapentin.  Kathy Bishop had her last 2D diagnostic bilateral mammogram with adjunct tomography on 09/13/2017 showing: Breast Density Category C. There are interval post lumpectomy and postradiation changes on the left. There are no suspicious findings for malignancy in either breast.  She is scheduled for repeat routine yearly mammography due tomorrow 09/14/2018   REVIEW OF SYSTEMS: Kathy Bishop is doing well overall. She has been reading 'The oral History of 9/11,' which she finds sobering. She was experiencing nausea 24/7 with some rectal bleeding, which she went to her Primary Care Physician (PCP), Dr. Deborra Medina, for valuation. Her PCP ordered a CT of her pancreas, which is i pending. The PCP also recommended a colonoscopy and possibly and endoscopy.  She does not experience pain in her stomach, or have any gastric discomfort between meals. The patient denies unusual headaches, visual changes, vomiting, or dizziness. There has been no unusual cough, phlegm production, or pleurisy. This been no change in bladder habits. The patient denies unexplained fatigue or unexplained weight loss, rash, or fever. A detailed review of  systems was otherwise noncontributory.    Past Medical History:  Diagnosis Date  . Anxiety   . Breast cancer ([place])   . Depression   . Dermatitis    allergic  . Foot pain, right    . History of radiation therapy 12/20/16- 01/17/17   Left Breast 40.05 Gy in 15 fractions and a 10 Gy boost in 5 fractions.   . Malignant neoplasm of upper-outer quadrant of left breast in female, estrogen receptor positive ([place]) 08/31/2016  . Need for prophylactic hormone replacement therapy (postmenopausal)   . Nocturia   . Routine general medical examination at a health care facility   . Symptomatic menopausal or female climacteric states   . Thoracic or lumbosacral neuritis or radiculitis, unspecified    Past Surgical History:  Procedure Laterality Date  . BREAST LUMPECTOMY Left   . BREAST LUMPECTOMY WITH SENTINEL LYMPH NODE BIOPSY Left 11/09/2016   Procedure: LEFT BREAST LUMPECTOMY WITH SENTINEL LYMPH NODE BX;  Surgeon: Coralie Keens, MD;  Location: [facility];  Service: General;  Laterality: Left;  . TONSILLECTOMY     age 67    No family history on file. The patient's father died from lung cancer in the setting of tobacco. Patient's mother died at age 31 with Alzheimer's is the patient has no brothers, 2 sisters. There is no history of breast or ovarian cancer in the family   GYNECOLOGIC HISTORY:  No LMP recorded. Patient is postmenopausal. Menarche age 57, the patient is GX P0. She stopped having periods approximately 2013. She has been on oral contraceptives more than 5 years, stopping in mid-November 2017.  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  She retired April 2019.  Her husband Clair Gulling is self-employed in [place] and runs a [employer]. At home is just the 2 of them plus a dog and 3 cats.                          ADVANCED DIRECTIVES: Not in place   Objective: Middle-aged white woman in no acute distress  Vitals:   09/13/18 1030  BP: (!) 141/95  Pulse: 70  Resp: 18  Temp: 97.9 F (36.6 C)  SpO2: 100%     Body mass index is 20.47 kg/m.    ECOG FS:0 -  Asymptomatic Filed Weights   09/13/18 1030  Weight: 118 lb 4.8 oz (53.7 kg)   Sclerae unicteric, pupils round and equal Oropharynx clear and moist No cervical or supraclavicular adenopathy Lungs no rales or rhonchi Heart regular rate and rhythm Abd soft, nontender, positive bowel sounds MSK no focal spinal tenderness, no upper extremity lymphedema Neuro: nonfocal, well oriented, appropriate affect Breasts: The right breast is unremarkable.  The left breast has undergone lumpectomy followed by radiation.  There is no evidence of disease recurrence.  Both axillae are benign.  LAB RESULTS:  CMP     Component Value Date/Time   NA 140 08/30/2018 1139   NA 140 10/12/2017 1302   K 4.6 08/30/2018 1139   K 4.0 10/12/2017 1302   CL 104 08/30/2018 1139   CO2 28 08/30/2018 1139   CO2 27 10/12/2017 1302   GLUCOSE 92 08/30/2018 1139   GLUCOSE 101 10/12/2017 1302   BUN 12 08/30/2018 1139   BUN 11.4 10/12/2017 1302   CREATININE 0.73 08/30/2018 1139   CREATININE 0.8 10/12/2017 1302   CALCIUM 9.5 08/30/2018  1139   CALCIUM 9.3 10/12/2017 1302   PROT 7.0 08/30/2018 1139   PROT 6.9 10/12/2017 1302   ALBUMIN 4.6 08/30/2018 1139   ALBUMIN 4.3 10/12/2017 1302   AST 15 08/30/2018 1139   AST 17 10/12/2017 1302   ALT 12 08/30/2018 1139   ALT 13 10/12/2017 1302   ALKPHOS 38 (L) 08/30/2018 1139   ALKPHOS 66 10/12/2017 1302   BILITOT 0.6 08/30/2018 1139   BILITOT 0.59 10/12/2017 1302   GFRNONAA >60 03/20/2018 0859   GFRAA >60 03/20/2018 0859    INo results found for: SPEP, UPEP  Lab Results  Component Value Date   WBC 4.2 09/13/2018   NEUTROABS 2.4 09/13/2018   HGB 13.0 09/13/2018   HCT 40.0 09/13/2018   MCV 98.8 09/13/2018   PLT 180 09/13/2018      Chemistry      Component Value Date/Time   NA 140 08/30/2018 1139   NA 140 10/12/2017 1302   K 4.6 08/30/2018 1139   K 4.0 10/12/2017 1302   CL 104 08/30/2018 1139   CO2 28 08/30/2018 1139   CO2 27 10/12/2017 1302   BUN 12  08/30/2018 1139   BUN 11.4 10/12/2017 1302   CREATININE 0.73 08/30/2018 1139   CREATININE 0.8 10/12/2017 1302      Component Value Date/Time   CALCIUM 9.5 08/30/2018 1139   CALCIUM 9.3 10/12/2017 1302   ALKPHOS 38 (L) 08/30/2018 1139   ALKPHOS 66 10/12/2017 1302   AST 15 08/30/2018 1139   AST 17 10/12/2017 1302   ALT 12 08/30/2018 1139   ALT 13 10/12/2017 1302   BILITOT 0.6 08/30/2018 1139   BILITOT 0.59 10/12/2017 1302       No results found for: LABCA2  No components found for: LABCA125  No results for input(s): INR in the last 168 hours.  Urinalysis    Component Value Date/Time   COLORURINE YELLOW 02/14/2013 0703   APPEARANCEUR CLOUDY (A) 02/14/2013 0703   LABSPEC 1.018 02/14/2013 0703   PHURINE 8.0 02/14/2013 0703   GLUCOSEU NEGATIVE 02/14/2013 0703   HGBUR NEGATIVE 02/14/2013 0703   BILIRUBINUR NEGATIVE 02/14/2013 0703   KETONESUR 15 (A) 02/14/2013 0703   PROTEINUR NEGATIVE 02/14/2013 0703   UROBILINOGEN 0.2 02/14/2013 0703   NITRITE NEGATIVE 02/14/2013 0703   LEUKOCYTESUR NEGATIVE 02/14/2013 0703     STUDIES: Most recent mammogram reviewed  ELIGIBLE FOR AVAILABLE RESEARCH PROTOCOL: no  ASSESSMENT: 67 y.o. [place] woman status post left breast upper outer quadrant biopsy x2 on 08/20/2016 for a clinical mT2 N0, stage IIA invasive ductal carcinoma, both tumors showing identical prognostic panels: Estrogen and progesterone receptor strongly positive, HER-2 negative, with an MIB-1 of 10-15%   (1) neoadjuvant anastrozole started 09/10/2016 in anticipation possible surgical delays  (2) status post left lumpectomy and sentinel lymph node sampling 11/09/2016 for a mpT2 pN0, stage IB (2018 classification) invasive ductal carcinoma, with negative margins.  (3) Oncotype DX score of 25 predicts a risk of outside the breast recurrence of 17% if the patient's only systemic therapy is tamoxifen for 5 years. It predicts a risk reduction of approximately an additional 4%  if chemotherapy is added.  (a) the patient opted against adjuvant chemotherapy  (4) adjuvant radiation  12/20/16 - 01/17/17 Site/dose:   Left Breast treated to 40.05 Gy in 15 fx of 2.67 Gy/fx  and a 10 Gy boost in 5 fx of 2 Gy/fx   (5) anastrozole started January 2018, stopped January 2019 with arthralgias/myalgias  (a) bone density  12/08/2016 showed a T score of -0.2 (normal).  (6) tamoxifen started 11/28/2017  PLAN: Kathy Bishop is now just about 2 years out from definitive surgery for her breast cancer with no evidence of disease recurrence.  This is very favorable.  She is tolerating tamoxifen well and the plan will be to continue antiestrogens for a total of 5 years (recall she did take anastrozole for 1 year so she will continue on antiestrogens through January 2023).  Her blood pressure today was on the high side.  She has not been monitoring this closely.  I think she would benefit from clonidine patches both on that account and because of her hot flashes.  I have gone ahead and discussed that with her and placed the prescription.  She will let me know if any problems develop  Given the fact that her mammogram will be in December of next year I will see her again in January 2021  She knows to call for any other issues that may develop before that visit.   Kathy Bishop, Virgie Dad, MD  09/13/18 10:43 AM Medical Oncology and Hematology [facility] [address] [place], [place] [address] Tel. [phone]    Fax. [phone]   I, Jacqualyn Posey am acting as a Education administrator for Chauncey Cruel, MD.   I, Lurline Del MD, have reviewed the above documentation for accuracy and completeness, and I agree with the above.

## 2018-09-13 ENCOUNTER — Telehealth: Payer: Self-pay | Admitting: Internal Medicine

## 2018-09-13 ENCOUNTER — Inpatient Hospital Stay: Payer: PPO | Attending: Oncology | Admitting: Oncology

## 2018-09-13 ENCOUNTER — Inpatient Hospital Stay: Payer: PPO

## 2018-09-13 VITALS — BP 141/95 | HR 70 | Temp 97.9°F | Resp 18 | Ht 63.75 in | Wt 118.3 lb

## 2018-09-13 DIAGNOSIS — Z79810 Long term (current) use of selective estrogen receptor modulators (SERMs): Secondary | ICD-10-CM

## 2018-09-13 DIAGNOSIS — C50412 Malignant neoplasm of upper-outer quadrant of left female breast: Secondary | ICD-10-CM

## 2018-09-13 DIAGNOSIS — R232 Flushing: Secondary | ICD-10-CM

## 2018-09-13 DIAGNOSIS — Z79899 Other long term (current) drug therapy: Secondary | ICD-10-CM | POA: Diagnosis not present

## 2018-09-13 DIAGNOSIS — R351 Nocturia: Secondary | ICD-10-CM | POA: Diagnosis not present

## 2018-09-13 DIAGNOSIS — Z923 Personal history of irradiation: Secondary | ICD-10-CM | POA: Diagnosis not present

## 2018-09-13 DIAGNOSIS — Z780 Asymptomatic menopausal state: Secondary | ICD-10-CM

## 2018-09-13 DIAGNOSIS — F418 Other specified anxiety disorders: Secondary | ICD-10-CM

## 2018-09-13 DIAGNOSIS — Z170 Estrogen receptor positive status [ER+]: Secondary | ICD-10-CM | POA: Diagnosis not present

## 2018-09-13 LAB — CBC WITH DIFFERENTIAL/PLATELET
Abs Immature Granulocytes: 0.01 10*3/uL (ref 0.00–0.07)
BASOS PCT: 1 %
Basophils Absolute: 0 10*3/uL (ref 0.0–0.1)
Eosinophils Absolute: 0.1 10*3/uL (ref 0.0–0.5)
Eosinophils Relative: 2 %
HCT: 40 % (ref 36.0–46.0)
Hemoglobin: 13 g/dL (ref 12.0–15.0)
Immature Granulocytes: 0 %
Lymphocytes Relative: 32 %
Lymphs Abs: 1.3 10*3/uL (ref 0.7–4.0)
MCH: 32.1 pg (ref 26.0–34.0)
MCHC: 32.5 g/dL (ref 30.0–36.0)
MCV: 98.8 fL (ref 80.0–100.0)
Monocytes Absolute: 0.3 10*3/uL (ref 0.1–1.0)
Monocytes Relative: 8 %
Neutro Abs: 2.4 10*3/uL (ref 1.7–7.7)
Neutrophils Relative %: 57 %
PLATELETS: 180 10*3/uL (ref 150–400)
RBC: 4.05 MIL/uL (ref 3.87–5.11)
RDW: 11.9 % (ref 11.5–15.5)
WBC: 4.2 10*3/uL (ref 4.0–10.5)
nRBC: 0 % (ref 0.0–0.2)

## 2018-09-13 LAB — COMPREHENSIVE METABOLIC PANEL
ALBUMIN: 4 g/dL (ref 3.5–5.0)
ALT: 8 U/L (ref 0–44)
ANION GAP: 7 (ref 5–15)
AST: 15 U/L (ref 15–41)
Alkaline Phosphatase: 41 U/L (ref 38–126)
BILIRUBIN TOTAL: 0.5 mg/dL (ref 0.3–1.2)
BUN: 15 mg/dL (ref 8–23)
CO2: 27 mmol/L (ref 22–32)
Calcium: 9.1 mg/dL (ref 8.9–10.3)
Chloride: 107 mmol/L (ref 98–111)
Creatinine, Ser: 0.81 mg/dL (ref 0.44–1.00)
GFR calc Af Amer: >60 mL/min (ref >60)
GLUCOSE: 94 mg/dL (ref 70–99)
POTASSIUM: 4.1 mmol/L (ref 3.5–5.1)
Sodium: 141 mmol/L (ref 135–145)
TOTAL PROTEIN: 6.5 g/dL (ref 6.5–8.1)

## 2018-09-13 MED ORDER — TAMOXIFEN CITRATE 20 MG PO TABS: 20.0000 mg | ORAL_TABLET | Freq: Every day | ORAL | 4 refills | Status: DC

## 2018-09-13 MED ORDER — CLONIDINE 0.1 MG/24HR TD PTWK: 0.1000 mg | MEDICATED_PATCH | TRANSDERMAL | 12 refills | Status: DC

## 2018-09-13 NOTE — Telephone Encounter (Signed)
Gave patient avs report and appointments for January 2021. Per 09/13/18 los lab/fu January 2021.

## 2018-09-14 ENCOUNTER — Ambulatory Visit
Admission: RE | Admit: 2018-09-14 | Discharge: 2018-09-14 | Disposition: A | Discharge status: Home / Self Care | Payer: PPO | Source: Ambulatory Visit | Attending: Oncology | Admitting: Oncology

## 2018-09-14 DIAGNOSIS — Z853 Personal history of malignant neoplasm of breast: Secondary | ICD-10-CM

## 2018-09-14 DIAGNOSIS — R922 Inconclusive mammogram: Secondary | ICD-10-CM | POA: Diagnosis not present

## 2018-09-14 HISTORY — DX: Personal history of irradiation: Z92.3

## 2018-09-20 ENCOUNTER — Ambulatory Visit
Admission: RE | Admit: 2018-09-20 | Discharge: 2018-09-20 | Disposition: A | Discharge status: Home / Self Care | Payer: PPO | Source: Ambulatory Visit | Attending: Family Medicine | Admitting: Family Medicine

## 2018-09-20 DIAGNOSIS — R634 Abnormal weight loss: Secondary | ICD-10-CM

## 2018-09-20 DIAGNOSIS — R748 Abnormal levels of other serum enzymes: Secondary | ICD-10-CM

## 2018-09-20 DIAGNOSIS — R110 Nausea: Secondary | ICD-10-CM

## 2018-09-20 DIAGNOSIS — K625 Hemorrhage of anus and rectum: Secondary | ICD-10-CM | POA: Diagnosis not present

## 2018-10-02 ENCOUNTER — Encounter: Payer: Self-pay | Admitting: Family Medicine

## 2018-10-02 ENCOUNTER — Other Ambulatory Visit: Payer: Self-pay | Admitting: Family Medicine

## 2018-10-02 NOTE — Telephone Encounter (Signed)
This was not supposed to be taken off of chart/refilled/thx dmf

## 2018-10-19 ENCOUNTER — Other Ambulatory Visit: Payer: Self-pay | Admitting: Family Medicine

## 2018-10-30 DIAGNOSIS — C50912 Malignant neoplasm of unspecified site of left female breast: Secondary | ICD-10-CM | POA: Diagnosis not present

## 2019-01-29 ENCOUNTER — Other Ambulatory Visit: Payer: Self-pay | Admitting: Family Medicine

## 2019-04-22 ENCOUNTER — Other Ambulatory Visit: Payer: Self-pay | Admitting: Family Medicine

## 2019-05-25 ENCOUNTER — Other Ambulatory Visit: Payer: Self-pay | Admitting: Oncology

## 2019-07-21 ENCOUNTER — Other Ambulatory Visit: Payer: Self-pay | Admitting: Family Medicine

## 2019-10-15 ENCOUNTER — Encounter: Payer: Self-pay | Admitting: Oncology

## 2019-10-15 NOTE — Progress Notes (Signed)
ID: Kathy Bishop DOB: Feb 12, 1951  MR#: [id number]  JTT#:[id number]  Patient Care Team: Lucille Passy, MD as PCP - General Kathy Keens, MD as Consulting Physician (General Surgery) Rebecca Motta, Virgie Dad, MD as Consulting Physician (Oncology) Eppie Gibson, MD as Attending Physician (Radiation Oncology) Delice Bison Charlestine Massed, NP as Nurse Practitioner (Hematology and Oncology) OTHER MD:  CHIEF COMPLAINT: Estrogen receptor positive breast cancer  CURRENT TREATMENT: tamoxifen   INTERVAL HISTORY: Keiry returns today for follow-up for her estrogen receptor positive breast cancer.   She continues on tamoxifen, but also continues to have problems from it.  She has hot flashes which are "never ending".  She has tried Neurontin which did not help and venlafaxine which she said caused diarrhea, although that would be a very unusual side effect at the very low dose she received.  She also had a prescription for the clonidine patch which she never filled.  Since her last visit, she underwent bilateral diagnostic mammography with tomography at [facility] on 09/14/2018 showing: breast density category C; no evidence of malignancy in either breast. She is due for repeat imaging.  She presented to her PCP with nausea, unintended weight loss, and rectal bleeding. She underwent abdomen/pelvis CT on 09/20/2018, which showed no abnormality to explain these symptoms.   REVIEW OF SYSTEMS: Kathy Bishop's 69th birthday is tomorrow!  [facility] and is being very careful regarding pandemic precautions.  They were also careful over the holidays.  A detailed review of systems today was otherwise noncontributory   BREAST CANCER HISTORY: From the original intake note:  [name] (pronounced "le-veen") reports noticing a mass in her left breast for about a year but did not think to bring this to medical attention, thinking it might be a cyst. The mass was picked up on  routine physical exam by Dr. Deborra Bishop who set up the patient for bilateral screening mammography at the [facility] 08/10/2016. This showed the breast density to be category C. A possible mass was noted in the left breast and on 08/18/2016 she was recalled for left diagnostic mammography with tomography and left breast ultrasonography. This confirmed a 2.9 cm spiculated mass in the upper outer quadrant of the left breast. This was palpable at the 1:00 position 8 cm from the nipple. Ultrasonography found an irregular hypoechoic mass at this site measuring 3.5 cm. The left axilla was sonographically benign.  The patient was then set up for ultrasound-guided biopsy of the left breast mass 08/20/2016. At the time of this ultrasound multiple solid subcentimeter satellite nodules were noted in the upper-outer quadrant of the left breast and one of these was biopsied as well as the main mass. The distance between the clips is 5.0 cm. 4 The pathology from that procedure ([id number]) both showed invasive ductal carcinoma, grade 2, with similar prognostic panels. Specifically the estrogen receptor was 95% and the progesterone receptor 100% for both biopsies, both with strong staining intensity. The MIB-1 was between 10 and 15%. Both tumors were HER-2 negative, with a signals ratio between 0.78 and 1.06 and the number per cell between 1.45 and 1.80  The patient's subsequent history is as detailed below   PAST MEDICAL HISTORY: Past Medical History:  Diagnosis Date  . Anxiety   . Breast cancer ([place])   . Depression   . Dermatitis    allergic  . Foot pain, right   . History of radiation therapy 12/20/16- 01/17/17  Left Breast 40.05 Gy in 15 fractions and a 10 Gy boost in 5 fractions.   . Malignant neoplasm of upper-outer quadrant of left breast in female, estrogen receptor positive ([place]) 08/31/2016  . Need for prophylactic hormone replacement therapy (postmenopausal)   . Nocturia   . Personal history of  radiation therapy   . Routine general medical examination at a health care facility   . Symptomatic menopausal or female climacteric states   . Thoracic or lumbosacral neuritis or radiculitis, unspecified     PAST SURGICAL HISTORY: Past Surgical History:  Procedure Laterality Date  . BREAST LUMPECTOMY Left   . BREAST LUMPECTOMY WITH SENTINEL LYMPH NODE BIOPSY Left 11/09/2016   Procedure: LEFT BREAST LUMPECTOMY WITH SENTINEL LYMPH NODE BX;  Surgeon: Kathy Keens, MD;  Location: [facility];  Service: General;  Laterality: Left;  . TONSILLECTOMY     age 51    FAMILY HISTORY:  Family History  Problem Relation Age of Onset  . Alzheimer's disease Mother   . Lung cancer Father        smoker   The patient's father died from lung cancer in the setting of tobacco. Patient's mother died at age 66 with Alzheimer's is the patient has no brothers, 2 sisters. There is no history of breast or ovarian cancer in the family    GYNECOLOGIC HISTORY:  No LMP recorded. Patient is postmenopausal. Menarche age 44, the patient is GX P0. She stopped having periods approximately 2013. She has been on oral contraceptives more than 5 years, stopping in mid-November 2017.   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  She retired April 2019.  Her husband Kathy Bishop is self-employed in [employer] and runs a [employer]. At home is just the 2 of them plus a dog and 3 cats.                          ADVANCED DIRECTIVES: Not in place   HEALTH MAINTENANCE: Social History   Tobacco Use  . Smoking status: Former Research scientist (life sciences)  . Smokeless tobacco: Never Used  . Tobacco comment: smoked years ago in her 20's socially  Substance Use Topics  . Alcohol use: Yes    Comment: social  . Drug use: No    Colonoscopy:  Pap:  Bone Density:  No Known Allergies  Current Outpatient Medications  Medication Sig Dispense Refill  .  cloNIDine (CATAPRES-TTS-1) 0.1 mg/24hr patch Place 1 patch (0.1 mg total) onto the skin once a week. 4 patch 12  . escitalopram (LEXAPRO) 20 MG tablet TAKE 1 AND 1/2 TABLETS DAILY BY MOUTH 135 tablet 0  . gabapentin (NEURONTIN) 300 MG capsule TAKE 1 CAPSULE (300 MG TOTAL) BY MOUTH AT BEDTIME. 90 capsule 1  . hydrOXYzine (ATARAX/VISTARIL) 10 MG tablet Take 1 tablet (10 mg total) by mouth 3 (three) times daily as needed. 60 tablet 5  . ondansetron (ZOFRAN) 4 MG tablet Take 1 tablet (4 mg total) by mouth every 8 (eight) hours as needed for nausea. 30 tablet 0  . promethazine (PHENERGAN) 25 MG tablet TAKE 1 TABLET (25 MG TOTAL) BY MOUTH EVERY 8 (EIGHT) HOURS AS NEEDED FOR NAUSEA. 60 tablet 2  . tamoxifen (NOLVADEX) 20 MG tablet Take 1 tablet (20 mg total) by mouth daily. 90 tablet 4   No current facility-administered medications for this visit.    OBJECTIVE: Middle-aged white woman who appears  stated age  39:   10/16/19 1251  BP: 134/77  Pulse: 68  Resp: 17  Temp: 98.7 F (37.1 C)  SpO2: 100%     Body mass index is 22.28 kg/m.    ECOG FS:0 - Asymptomatic Filed Weights   10/16/19 1251  Weight: 128 lb 12.8 oz (58.4 kg)    Sclerae unicteric, EOMs intact Wearing a mask No cervical or supraclavicular adenopathy Lungs no rales or rhonchi Heart regular rate and rhythm Abd soft, nontender, positive bowel sounds MSK no focal spinal tenderness, no upper extremity lymphedema Neuro: nonfocal, well oriented, appropriate affect Breasts: The right breast is benign.  The left breast is status post lumpectomy and radiation with no evidence of disease recurrence.  Both axillae are benign.   LAB RESULTS:  CMP     Component Value Date/Time   NA 141 09/13/2018 0959   NA 140 10/12/2017 1302   K 4.1 09/13/2018 0959   K 4.0 10/12/2017 1302   CL 107 09/13/2018 0959   CO2 27 09/13/2018 0959   CO2 27 10/12/2017 1302   GLUCOSE 94 09/13/2018 0959   GLUCOSE 101 10/12/2017 1302   BUN 15  09/13/2018 0959   BUN 11.4 10/12/2017 1302   CREATININE 0.81 09/13/2018 0959   CREATININE 0.8 10/12/2017 1302   CALCIUM 9.1 09/13/2018 0959   CALCIUM 9.3 10/12/2017 1302   PROT 6.5 09/13/2018 0959   PROT 6.9 10/12/2017 1302   ALBUMIN 4.0 09/13/2018 0959   ALBUMIN 4.3 10/12/2017 1302   AST 15 09/13/2018 0959   AST 17 10/12/2017 1302   ALT 8 09/13/2018 0959   ALT 13 10/12/2017 1302   ALKPHOS 41 09/13/2018 0959   ALKPHOS 66 10/12/2017 1302   BILITOT 0.5 09/13/2018 0959   BILITOT 0.59 10/12/2017 1302   GFRNONAA >60 09/13/2018 0959   GFRAA >60 09/13/2018 0959    INo results found for: SPEP, UPEP  Lab Results  Component Value Date   WBC 4.7 10/16/2019   NEUTROABS 2.9 10/16/2019   HGB 13.0 10/16/2019   HCT 38.9 10/16/2019   MCV 98.0 10/16/2019   PLT 201 10/16/2019      Chemistry      Component Value Date/Time   NA 141 09/13/2018 0959   NA 140 10/12/2017 1302   K 4.1 09/13/2018 0959   K 4.0 10/12/2017 1302   CL 107 09/13/2018 0959   CO2 27 09/13/2018 0959   CO2 27 10/12/2017 1302   BUN 15 09/13/2018 0959   BUN 11.4 10/12/2017 1302   CREATININE 0.81 09/13/2018 0959   CREATININE 0.8 10/12/2017 1302      Component Value Date/Time   CALCIUM 9.1 09/13/2018 0959   CALCIUM 9.3 10/12/2017 1302   ALKPHOS 41 09/13/2018 0959   ALKPHOS 66 10/12/2017 1302   AST 15 09/13/2018 0959   AST 17 10/12/2017 1302   ALT 8 09/13/2018 0959   ALT 13 10/12/2017 1302   BILITOT 0.5 09/13/2018 0959   BILITOT 0.59 10/12/2017 1302       No results found for: LABCA2  No components found for: [id number]  No results for input(s): INR in the last 168 hours.  Urinalysis    Component Value Date/Time   COLORURINE YELLOW 02/14/2013 0703   APPEARANCEUR CLOUDY (A) 02/14/2013 0703   LABSPEC 1.018 02/14/2013 0703   PHURINE 8.0 02/14/2013 0703   GLUCOSEU NEGATIVE 02/14/2013 0703   HGBUR NEGATIVE 02/14/2013 0703   BILIRUBINUR NEGATIVE 02/14/2013 0703   KETONESUR 15 (A) 02/14/2013 0703  PROTEINUR NEGATIVE 02/14/2013 0703   UROBILINOGEN 0.2 02/14/2013 0703   NITRITE NEGATIVE 02/14/2013 0703   LEUKOCYTESUR NEGATIVE 02/14/2013 0703    STUDIES: CLINICAL DATA:  Nausea. Unintended weight loss. Rectal bleeding. History of previous breast cancer.  EXAM: CT ABDOMEN AND PELVIS WITHOUT CONTRAST  TECHNIQUE: Multidetector CT imaging of the abdomen and pelvis was performed following the standard protocol without IV contrast.  COMPARISON:  None.  FINDINGS: Lower chest: Normal  Hepatobiliary: 7 mm cyst in the right lobe of the liver. No other liver abnormality seen on this noncontrast study. No calcified gallstones.  Pancreas: Normal  Spleen: Normal  Adrenals/Urinary Tract: Adrenal glands are normal. Right kidney is normal. Left kidney is normal except for 2 round low-density areas in the upper pole of left kidney likely to represent cysts and a 4 mm angiomyolipoma at the lower pole of the left kidney. Bladder is normal.  Stomach/Bowel: No bowel pathology is seen. No evidence of ileus or obstruction. No sign of focal bowel lesion. No abnormality seen to explain rectal bleeding.  Vascular/Lymphatic: No adenopathy. Minimal atherosclerotic plaque of the aorta. No aneurysm. IVC is normal.  Reproductive: Retroverted uterus.  No pelvic mass.  Other: No free fluid or air.  Musculoskeletal: Negative  IMPRESSION: No abnormality seen to explain the presenting symptoms. Few incidental insignificant findings as outlined above.   Electronically Signed   By: Nelson Chimes M.D.   On: 09/20/2018 15:57   ELIGIBLE FOR AVAILABLE RESEARCH PROTOCOL: no  ASSESSMENT: 69 y.o. [place] woman status post left breast upper outer quadrant biopsy x2 on 08/20/2016 for a clinical mT2 N0, stage IIA invasive ductal carcinoma, both tumors showing identical prognostic panels: Estrogen and progesterone receptor strongly positive, HER-2 negative, with an MIB-1 of 10-15%    (1) neoadjuvant anastrozole started 09/10/2016 in anticipation possible surgical delays  (2) status post left lumpectomy and sentinel lymph node sampling 11/09/2016 for a mpT2 pN0, stage IB (2018 classification) invasive ductal carcinoma, with negative margins.  (3) Oncotype DX score of 25 predicts a risk of outside the breast recurrence of 17% if the patient's only systemic therapy is tamoxifen for 5 years. It predicts a risk reduction of approximately an additional 4% if chemotherapy is added.  (a) the patient opted against adjuvant chemotherapy  (4) adjuvant radiation  12/20/16 - 01/17/17 Site/dose:   Left Breast treated to 40.05 Gy in 15 fx of 2.67 Gy/fx  and a 10 Gy boost in 5 fx of 2 Gy/fx   (5) anastrozole started January 2018, stopped January 2019 with arthralgias/myalgias  (a) bone density 12/08/2016 showed a T score of -0.2 (normal).  (6) tamoxifen started 11/28/2017, discontinued January 2021 secondary to hot flashes  (7) to start Aromasin first week in February 2021   PLAN: Doniesha is now 3 years out from definitive surgery with no evidence of disease recurrence.  This is favorable.  She continues on tamoxifen but has not been able to quite become comfortable with this medication.  Part of the problem is that she has nausea and other GI symptoms which she attributes to tamoxifen and which are likely not related.  Similarly she was unable to tolerate venlafaxine for the hot flashes because she had GI issues at the time, which again may or may not have been related.  In any case I think this is a good time to switch to an aromatase inhibitor.  We discussed the possible benefits including further risk reduction and then simply continuing tamoxifen.  I asked her to  stop tamoxifen today, wait a month, and at that time reassess how she is doing in terms of hot flashes and any other symptoms she attributes to the drug.  She will then start Aromasin and then I will see her again in April.   If she tolerates Aromasin well the plan will be to continue that for an additional 2 years.  I think she needs a GI work-up for her GI symptoms.  In addition she has never had a colonoscopy.  I am referring her to Dr. Collene Mares with that in mind  [place] has a good understanding of this plan.  She agrees with it.  She will call with any other issues that may develop before the next visit.  I spent 50 minutes total time in this encounter with Marylou Flesher, Virgie Dad, MD  10/16/19 1:11 PM Medical Oncology and Hematology [facility] [place], [place] [address] Tel. [phone]    Fax. [phone]   I, Wilburn Mylar, am acting as scribe for Dr. Virgie Dad. Shyra Emile.  I, Lurline Del MD, have reviewed the above documentation for accuracy and completeness, and I agree with the above.

## 2019-10-16 ENCOUNTER — Inpatient Hospital Stay: Payer: PPO | Attending: Oncology | Admitting: Oncology

## 2019-10-16 ENCOUNTER — Encounter: Payer: Self-pay | Admitting: Oncology

## 2019-10-16 ENCOUNTER — Inpatient Hospital Stay: Payer: PPO

## 2019-10-16 ENCOUNTER — Other Ambulatory Visit: Payer: Self-pay

## 2019-10-16 VITALS — BP 134/77 | HR 68 | Temp 98.7°F | Resp 17 | Ht 63.75 in | Wt 128.8 lb

## 2019-10-16 DIAGNOSIS — F418 Other specified anxiety disorders: Secondary | ICD-10-CM | POA: Diagnosis not present

## 2019-10-16 DIAGNOSIS — Z87891 Personal history of nicotine dependence: Secondary | ICD-10-CM | POA: Insufficient documentation

## 2019-10-16 DIAGNOSIS — Z170 Estrogen receptor positive status [ER+]: Secondary | ICD-10-CM | POA: Insufficient documentation

## 2019-10-16 DIAGNOSIS — Z79899 Other long term (current) drug therapy: Secondary | ICD-10-CM | POA: Diagnosis not present

## 2019-10-16 DIAGNOSIS — Z801 Family history of malignant neoplasm of trachea, bronchus and lung: Secondary | ICD-10-CM | POA: Insufficient documentation

## 2019-10-16 DIAGNOSIS — Z79811 Long term (current) use of aromatase inhibitors: Secondary | ICD-10-CM | POA: Diagnosis not present

## 2019-10-16 DIAGNOSIS — C50412 Malignant neoplasm of upper-outer quadrant of left female breast: Secondary | ICD-10-CM | POA: Diagnosis not present

## 2019-10-16 DIAGNOSIS — Z923 Personal history of irradiation: Secondary | ICD-10-CM | POA: Diagnosis not present

## 2019-10-16 DIAGNOSIS — R232 Flushing: Secondary | ICD-10-CM | POA: Diagnosis not present

## 2019-10-16 LAB — CBC WITH DIFFERENTIAL/PLATELET
Abs Immature Granulocytes: 0.01 10*3/uL (ref 0.00–0.07)
Basophils Absolute: 0 10*3/uL (ref 0.0–0.1)
Basophils Relative: 0 %
Eosinophils Absolute: 0.1 10*3/uL (ref 0.0–0.5)
Eosinophils Relative: 2 %
HCT: 38.9 % (ref 36.0–46.0)
Hemoglobin: 13 g/dL (ref 12.0–15.0)
Immature Granulocytes: 0 %
Lymphocytes Relative: 30 %
Lymphs Abs: 1.4 10*3/uL (ref 0.7–4.0)
MCH: 32.7 pg (ref 26.0–34.0)
MCHC: 33.4 g/dL (ref 30.0–36.0)
MCV: 98 fL (ref 80.0–100.0)
Monocytes Absolute: 0.3 10*3/uL (ref 0.1–1.0)
Monocytes Relative: 6 %
Neutro Abs: 2.9 10*3/uL (ref 1.7–7.7)
Neutrophils Relative %: 62 %
Platelets: 201 10*3/uL (ref 150–400)
RBC: 3.97 MIL/uL (ref 3.87–5.11)
RDW: 11.9 % (ref 11.5–15.5)
WBC: 4.7 10*3/uL (ref 4.0–10.5)
nRBC: 0 % (ref 0.0–0.2)

## 2019-10-16 LAB — COMPREHENSIVE METABOLIC PANEL
ALT: 11 U/L (ref 0–44)
AST: 15 U/L (ref 15–41)
Albumin: 4.1 g/dL (ref 3.5–5.0)
Alkaline Phosphatase: 39 U/L (ref 38–126)
Anion gap: 8 (ref 5–15)
BUN: 18 mg/dL (ref 8–23)
CO2: 26 mmol/L (ref 22–32)
Calcium: 8.9 mg/dL (ref 8.9–10.3)
Chloride: 107 mmol/L (ref 98–111)
Creatinine, Ser: 0.81 mg/dL (ref 0.44–1.00)
GFR calc Af Amer: >60 mL/min (ref >60)
GFR calc non Af Amer: >60 mL/min (ref >60)
Glucose, Bld: 87 mg/dL (ref 70–99)
Potassium: 4.4 mmol/L (ref 3.5–5.1)
Sodium: 141 mmol/L (ref 135–145)
Total Bilirubin: 0.3 mg/dL (ref 0.3–1.2)
Total Protein: 6.4 g/dL — ABNORMAL LOW (ref 6.5–8.1)

## 2019-10-16 MED ORDER — EXEMESTANE 25 MG PO TABS: 25.0000 mg | ORAL_TABLET | Freq: Every day | ORAL | 4 refills | Status: DC

## 2019-10-17 ENCOUNTER — Telehealth: Payer: Self-pay | Admitting: Oncology

## 2019-10-17 NOTE — Telephone Encounter (Signed)
I left a message regarding schedule  

## 2019-11-06 ENCOUNTER — Other Ambulatory Visit: Payer: Self-pay

## 2019-11-06 MED ORDER — ESCITALOPRAM OXALATE 20 MG PO TABS: ORAL_TABLET | ORAL | 0 refills | Status: DC

## 2019-11-12 NOTE — Progress Notes (Signed)
Virtual Visit via Video   Due to the COVID-19 pandemic, this visit was completed with telemedicine (audio/video) technology to reduce patient and provider exposure as well as to preserve personal protective equipment.   I connected with Kathy Bishop by a video enabled telemedicine application and verified that I am speaking with the correct person using two identifiers. Location patient: Home Location provider: [facility], Office Persons participating in the virtual visit: Kathy Bishop, Arnette Norris, MD   I discussed the limitations of evaluation and management by telemedicine and the availability of in person appointments. The patient expressed understanding and agreed to proceed.  Care Team   Patient Care Team: Lucille Passy, MD as PCP - Army Fossa, MD as Consulting Physician (General Surgery) Magrinat, Virgie Dad, MD as Consulting Physician (Oncology) Eppie Gibson, MD as Attending Physician (Radiation Oncology) Delice Bison Charlestine Massed, NP as Nurse Practitioner (Hematology and Oncology)  Subjective:   HPI:   Back pain and insomnia-  See problem based charting.  Patient is connecting to discuss a pain that is just above bottom in center of spine that radiates into left hip and into thigh and calf area. Has problems walking when it is painful. Last 1.5 wk is when it has started radiating. Pain is between 7-8 on a 10-point-scale.   Review of Systems  Constitutional: Negative for fever and malaise/fatigue.  HENT: Negative for congestion and hearing loss.   Eyes: Negative for blurred vision, discharge and redness.  Respiratory: Negative for cough and shortness of breath.   Cardiovascular: Negative for chest pain, palpitations and leg swelling.  Gastrointestinal: Negative for abdominal pain and heartburn.  Genitourinary: Negative for dysuria.  Musculoskeletal: Positive for back pain. Negative for falls.  Skin: Negative for rash.  Neurological: Positive for  tingling. Negative for tremors, sensory change, loss of consciousness and headaches.  Endo/Heme/Allergies: Does not bruise/bleed easily.  Psychiatric/Behavioral: Negative for depression, hallucinations, memory loss, substance abuse and suicidal ideas. The patient has insomnia. The patient is not nervous/anxious.   All other systems reviewed and are negative.    Patient Active Problem List   Diagnosis Date Noted  . Rectal bleeding 08/30/2018  . Unintended weight loss 08/30/2018  . Nausea 08/30/2018  . Rash 04/28/2017  . Malignant neoplasm of upper-outer quadrant of left breast in female, estrogen receptor positive ([place]) 08/31/2016  . BPV (benign positional vertigo) 03/09/2013  . Fatigue 02/15/2012  . Leukopenia 02/15/2012  . MENOPAUSE-RELATED VASOMOTOR SYMPTOMS, HOT FLASHES 12/26/2009  . BACK PAIN, LUMBAR, WITH RADICULOPATHY 06/17/2009  . NOCTURIA 05/06/2008  . Depression 03/02/2007    Social History   Tobacco Use  . Smoking status: Former Research scientist (life sciences)  . Smokeless tobacco: Never Used  . Tobacco comment: smoked years ago in her 20's socially  Substance Use Topics  . Alcohol use: Yes    Comment: social    Current Outpatient Medications:  .  escitalopram (LEXAPRO) 20 MG tablet, TAKE 1 AND 1/2 TABLETS DAILY BY MOUTH, Disp: 135 tablet, Rfl: 0 .  exemestane (AROMASIN) 25 MG tablet, Take 1 tablet (25 mg total) by mouth daily after breakfast., Disp: 90 tablet, Rfl: 4 .  hydrOXYzine (ATARAX/VISTARIL) 10 MG tablet, Take 1 tablet (10 mg total) by mouth 3 (three) times daily as needed., Disp: 60 tablet, Rfl: 5 .  promethazine (PHENERGAN) 25 MG tablet, TAKE 1 TABLET (25 MG TOTAL) BY MOUTH EVERY 8 (EIGHT) HOURS AS NEEDED FOR NAUSEA., Disp: 60 tablet, Rfl: 2  No Known Allergies  Objective:   VITALS: Per  patient if applicable, see vitals. GENERAL: Alert, appears well and in no acute distress. HEENT: Atraumatic, conjunctiva clear, no obvious abnormalities on inspection of external nose and  ears. NECK: Normal movements of the head and neck. CARDIOPULMONARY: No increased WOB. Speaking in clear sentences. I:E ratio WNL.  MS: Moves all visible extremities without noticeable abnormality. PSYCH: Pleasant and cooperative, well-groomed. Speech normal rate and rhythm. Affect is appropriate. Insight and judgement are appropriate. Attention is focused, linear, and appropriate.  NEURO: CN grossly intact. Oriented as arrived to appointment on time with no prompting. Moves both UE equally.  SKIN: No obvious lesions, wounds, erythema, or cyanosis noted on face or hands.  Depression screen [facility] 2/9 02/25/2017 12/14/2016 09/07/2016  Decreased Interest 0 0 0  Down, Depressed, Hopeless 0 0 0  PHQ - 2 Score 0 0 0     . COVID-19 Education: The signs and symptoms of COVID-19 were discussed with the patient and how to seek care for testing if needed. The importance of social distancing was discussed today. . Reviewed expectations re: course of current medical issues. . Discussed self-management of symptoms. . Outlined signs and symptoms indicating need for more acute intervention. . Patient verbalized understanding and all questions were answered. Marland Kitchen Health Maintenance issues including appropriate healthy diet, exercise, and smoking avoidance were discussed with patient. . See orders for this visit as documented in the electronic medical record.  Arnette Norris, MD  Records requested if needed. Time spent: 25 minutes, of which >50% was spent in obtaining information about her symptoms, reviewing her previous labs, evaluations, and treatments, counseling her about her condition (please see the discussed topics above), and developing a plan to further investigate it; she had a number of questions which I addressed.   Lab Results  Component Value Date   WBC 4.7 10/16/2019   HGB 13.0 10/16/2019   HCT 38.9 10/16/2019   PLT 201 10/16/2019   GLUCOSE 87 10/16/2019   CHOL 168 08/03/2016   TRIG 44.0  08/03/2016   HDL 80.90 08/03/2016   LDLCALC 78 08/03/2016   ALT 11 10/16/2019   AST 15 10/16/2019   NA 141 10/16/2019   K 4.4 10/16/2019   CL 107 10/16/2019   CREATININE 0.81 10/16/2019   BUN 18 10/16/2019   CO2 26 10/16/2019   TSH 1.51 08/03/2016    Lab Results  Component Value Date   TSH 1.51 08/03/2016   Lab Results  Component Value Date   WBC 4.7 10/16/2019   HGB 13.0 10/16/2019   HCT 38.9 10/16/2019   MCV 98.0 10/16/2019   PLT 201 10/16/2019   Lab Results  Component Value Date   NA 141 10/16/2019   K 4.4 10/16/2019   CHLORIDE 104 10/12/2017   CO2 26 10/16/2019   GLUCOSE 87 10/16/2019   BUN 18 10/16/2019   CREATININE 0.81 10/16/2019   BILITOT 0.3 10/16/2019   ALKPHOS 39 10/16/2019   AST 15 10/16/2019   ALT 11 10/16/2019   PROT 6.4 (L) 10/16/2019   ALBUMIN 4.1 10/16/2019   CALCIUM 8.9 10/16/2019   ANIONGAP 8 10/16/2019   EGFR >60 10/12/2017   GFR 84.55 08/30/2018   Lab Results  Component Value Date   CHOL 168 08/03/2016   Lab Results  Component Value Date   HDL 80.90 08/03/2016   Lab Results  Component Value Date   LDLCALC 78 08/03/2016   Lab Results  Component Value Date   TRIG 44.0 08/03/2016   Lab Results  Component Value Date  CHOLHDL 2 08/03/2016   No results found for: HGBA1C     Assessment & Plan:   Problem List Items Addressed This Visit    None      I am having Kathy Bishop maintain her hydrOXYzine, promethazine, exemestane, and escitalopram.  No orders of the defined types were placed in this encounter.    Arnette Norris, MD

## 2019-11-13 ENCOUNTER — Encounter: Payer: Self-pay | Admitting: Family Medicine

## 2019-11-13 ENCOUNTER — Other Ambulatory Visit: Payer: Self-pay

## 2019-11-13 ENCOUNTER — Ambulatory Visit (INDEPENDENT_AMBULATORY_CARE_PROVIDER_SITE_OTHER): Payer: PPO | Admitting: Family Medicine

## 2019-11-13 DIAGNOSIS — G4700 Insomnia, unspecified: Secondary | ICD-10-CM

## 2019-11-13 DIAGNOSIS — M549 Dorsalgia, unspecified: Secondary | ICD-10-CM | POA: Insufficient documentation

## 2019-11-13 MED ORDER — PREDNISONE 10 MG PO TABS: ORAL_TABLET | ORAL | 0 refills | Status: DC

## 2019-11-13 MED ORDER — ZOLPIDEM TARTRATE 5 MG PO TABS: 5.0000 mg | ORAL_TABLET | Freq: Every evening | ORAL | 1 refills | Status: DC | PRN

## 2019-11-13 NOTE — Assessment & Plan Note (Signed)
  The problem of recurrent insomnia is discussed. Avoidance of caffeine sources is strongly encouraged. Sleep hygiene issues are reviewed. The use of sedative hypnotics for temporary relief is appropriate; we discussed the addictive nature of these drugs, and a one-time only prescription for prn use of a hypnotic is given, to use no more than 3 times per week for 2-3 weeks.    She has tried OTC meds and they are not helping.

## 2019-11-13 NOTE — Assessment & Plan Note (Addendum)
SUBJECTIVE:  Kathy Bishop is a 69 y.o. female who complains of low back pain for 4 month(s), positional with bending or lifting, with radiation down the legs. Precipitating factors: none recalled by the patient. Prior history of back problems: recurrent self limited episodes of low back pain in the past. There is no numbness in the legs.    ASSESSMENT:  osteoarthritis of lumbo-sacral spine and with radiculopathy  PLAN: For acute pain, rest, intermittent application of heat (do not sleep on heating pad), scheduled Tylenol ES twice daily.  Also sent in prednisone burst and referred to PT and ordered lumbar xray given duration and progression of symptoms.    Call or return to clinic prn if these symptoms worsen or fail to improve as anticipated.  The patient indicates understanding of these issues and agrees with the plan.

## 2019-11-14 ENCOUNTER — Other Ambulatory Visit: Payer: PPO

## 2019-11-14 ENCOUNTER — Ambulatory Visit (INDEPENDENT_AMBULATORY_CARE_PROVIDER_SITE_OTHER): Payer: PPO

## 2019-11-14 ENCOUNTER — Other Ambulatory Visit: Payer: Self-pay

## 2019-11-14 DIAGNOSIS — M549 Dorsalgia, unspecified: Secondary | ICD-10-CM | POA: Diagnosis not present

## 2019-11-14 DIAGNOSIS — M47816 Spondylosis without myelopathy or radiculopathy, lumbar region: Secondary | ICD-10-CM | POA: Diagnosis not present

## 2019-11-28 ENCOUNTER — Other Ambulatory Visit: Payer: Self-pay

## 2019-11-28 ENCOUNTER — Encounter: Payer: Self-pay | Admitting: Physical Therapy

## 2019-11-28 ENCOUNTER — Ambulatory Visit: Payer: PPO | Attending: Family Medicine | Admitting: Physical Therapy

## 2019-11-28 DIAGNOSIS — M62830 Muscle spasm of back: Secondary | ICD-10-CM | POA: Diagnosis not present

## 2019-11-28 DIAGNOSIS — M545 Low back pain, unspecified: Secondary | ICD-10-CM

## 2019-11-28 DIAGNOSIS — G8929 Other chronic pain: Secondary | ICD-10-CM | POA: Diagnosis not present

## 2019-11-28 DIAGNOSIS — M6281 Muscle weakness (generalized): Secondary | ICD-10-CM | POA: Diagnosis not present

## 2019-11-28 MED ORDER — PROMETHAZINE HCL 25 MG PO TABS: 25.0000 mg | ORAL_TABLET | Freq: Three times a day (TID) | ORAL | 2 refills | Status: DC | PRN

## 2019-11-28 NOTE — Therapy (Signed)
[place], [place], [address] Phone: [phone]   Fax:  [phone]  Physical Therapy Evaluation  Patient Details  Name: Kathy Bishop MRN: [id number] Date of Birth: December 28, 1950 Referring Provider (PT): Dr Arnette Norris   Encounter Date: 11/28/2019  PT End of Session - 11/28/19 1146    Visit Number  1    Number of Visits  8    Date for PT Re-Evaluation  12/26/19    PT Start Time  1146    PT Stop Time  1221    PT Time Calculation (min)  35 min    Activity Tolerance  Patient tolerated treatment well    Behavior During Therapy  [facility] for tasks assessed/performed       Past Medical History:  Diagnosis Date  . Anxiety   . Breast cancer ([place])   . Depression   . Dermatitis    allergic  . Foot pain, right   . History of radiation therapy 12/20/16- 01/17/17   Left Breast 40.05 Gy in 15 fractions and a 10 Gy boost in 5 fractions.   . Malignant neoplasm of upper-outer quadrant of left breast in female, estrogen receptor positive ([place]) 08/31/2016  . Need for prophylactic hormone replacement therapy (postmenopausal)   . Nocturia   . Personal history of radiation therapy   . Routine general medical examination at a health care facility   . Symptomatic menopausal or female climacteric states   . Thoracic or lumbosacral neuritis or radiculitis, unspecified     Past Surgical History:  Procedure Laterality Date  . BREAST LUMPECTOMY Left   . BREAST LUMPECTOMY WITH SENTINEL LYMPH NODE BIOPSY Left 11/09/2016   Procedure: LEFT BREAST LUMPECTOMY WITH SENTINEL LYMPH NODE BX;  Surgeon: Coralie Keens, MD;  Location: [facility];  Service: General;  Laterality: Left;  . TONSILLECTOMY     age 80    There were no vitals filed for this visit.   Subjective Assessment - 11/28/19 1146    Subjective  Pt reports she began having back pain in Dec, it has gotten progressively worse.  Initially had it with  sitting and playing with her great nephew. Was on prednisone for a week - short term relief, using advil and heat now.  Currently walks and rides upright bike for exercise and does some ab work    How long can you walk comfortably?  2 miles    Diagnostic tests  xrays - DJD and disintegration    Currently in Pain?  Yes    Pain Score  5     Pain Location  Back    Pain Orientation  Left    Pain Descriptors / Indicators  Aching    Pain Type  Acute pain    Pain Radiating Towards  into calf sometimes    Pain Onset  More than a month ago    Pain Frequency  Intermittent    Aggravating Factors   sitting for to long    Pain Relieving Factors  advil and heat         [facility] PT Assessment - 11/28/19 0001      Assessment   Medical Diagnosis  LBP    Referring Provider (PT)  Dr Arnette Norris    Onset Date/Surgical Date  09/27/19    Hand Dominance  Right    Next MD Visit  to be scheduled    Prior Therapy   none  Precautions   Precautions  None      Balance Screen   Has the patient fallen in the past 6 months  No    Has the patient had a decrease in activity level because of a fear of falling?   No    Is the patient reluctant to leave their home because of a fear of falling?   No      Home Film/video editor residence      Prior Function   Level of Independence  Independent    Vocation  Retired    Leisure  netflix, Firefighter and playing with grand nephew      Observation/Other Assessments   Focus on Therapeutic Outcomes (FOTO)   43% limited      Functional Tests   Functional tests  Squat;Single leg stance      Squat   Comments  bilat LE instability with lowering       Single Leg Stance   Comments  Lt WNL, Rt increased accessory motion.       Posture/Postural Control   Posture/Postural Control  Postural limitations    Postural Limitations  Rounded Shoulders   thin framed     ROM / Strength   AROM / PROM / Strength  AROM;Strength      AROM   AROM  Assessment Site  Lumbar;Hip    Lumbar Flexion  4" from floor pulling behind knees     Lumbar Extension  WNL    Lumbar - Right Rotation  75% present with pulling in side    Lumbar - Left Rotation  WNL      Strength   Strength Assessment Site  Hip;Knee;Ankle;Lumbar    Right/Left Hip  --   WNL except extension 4/5   Right/Left Knee  --   WNL   Right/Left Ankle  --   WNL   Lumbar Flexion  --   TA good    Lumbar Extension  --   lumbar multifidi good      Flexibility   Soft Tissue Assessment /Muscle Length  --   Rt hip tight when compared to LT      Palpation   Spinal mobility  hypomobile and painfull with CPA and bilat UPA mobs L4-L1, only hypomobile in t spine.     Palpation comment  slight tightness in Rt lumbar paraspinals       Special Tests   Other special tests  (-) SRL and slump bilat                 Objective measurements completed on examination: See above findings.      [facility] Adult PT Treatment/Exercise - 11/28/19 0001      Exercises   Exercises  Lumbar      Lumbar Exercises: Stretches   Lower Trunk Rotation  5 reps      Lumbar Exercises: Prone   Opposite Arm/Leg Raise  Right arm/Left leg;Left arm/Right leg;10 reps   VC for form     Lumbar Exercises: Quadruped   Madcat/Old Horse  10 reps    Other Quadruped Lumbar Exercises  into childs pose             PT Education - 11/28/19 1321    Education Details  HEP, POC, rec pt have another bone density test    Person(s) Educated  Patient    Methods  Explanation;Demonstration;Handout    Comprehension  Returned demonstration;Verbalized understanding  PT Long Term Goals - 11/28/19 1333      PT LONG TERM GOAL #1   Title  I with advanced HEP ( 12/26/2019)    Time  4    Period  Weeks    Status  New    Target Date  12/26/19      PT LONG TERM GOAL #2   Title  demo lumbar ROM full and painfree to allow her to move and play with her grandnephew ( 12/26/2019)    Time  4    Period   Weeks    Status  New    Target Date  12/26/19      PT LONG TERM GOAL #3   Title  [name] FOTO =/< 31% limited ( 12/26/2019)    Time  4    Period  Weeks    Status  New    Target Date  12/26/19             Plan - 11/28/19 1322    Clinical Impression Statement  69 yo female with progressive low back pain that started in Dec.  She has a lot of tightness in the Rt low back area with some tenderness and hypomobility of the spine.  Overall strength if good with some weakness in her hip extensors and core.  Her pain is interfering with her daily activities at this time.  She would benefit from PT to address her defecits, relieve pain and allow her to perform normal activites.    Personal Factors and Comorbidities  Comorbidity 3+    Comorbidities  lumpectomy Rt breats 2018, anxiety and depression    Examination-Activity Limitations  Carry;Bend;Lift    Examination-Participation Restrictions  Other;Yard Work;Community Activity    Stability/Clinical Decision Making  Stable/Uncomplicated    Clinical Decision Making  Low    Rehab Potential  Good    PT Frequency  2x / week    PT Duration  4 weeks    PT Treatment/Interventions  Taping;Patient/family education;Functional mobility training;Moist Heat;Ultrasound;Therapeutic activities;Passive range of motion;Therapeutic exercise;Cryotherapy;Electrical Stimulation;Manual techniques;Dry needling    PT Next Visit Plan  work on lumbar mobility, DN to low back if needed, hip and abdominal strengtheing    Consulted and Agree with Plan of Care  Patient       Patient will benefit from skilled therapeutic intervention in order to improve the following deficits and impairments:  Decreased range of motion, Pain, Increased muscle spasms, Decreased strength  Visit Diagnosis: Chronic bilateral low back pain without sciatica - Plan: PT plan of care cert/re-cert  Muscle spasm of back - Plan: PT plan of care cert/re-cert  Muscle weakness (generalized) - Plan:  PT plan of care cert/re-cert     Problem List Patient Active Problem List   Diagnosis Date Noted  . Insomnia 11/13/2019  . Back pain 11/13/2019  . Rectal bleeding 08/30/2018  . Unintended weight loss 08/30/2018  . Nausea 08/30/2018  . Rash 04/28/2017  . Malignant neoplasm of upper-outer quadrant of left breast in female, estrogen receptor positive ([place]) 08/31/2016  . BPV (benign positional vertigo) 03/09/2013  . Fatigue 02/15/2012  . Leukopenia 02/15/2012  . MENOPAUSE-RELATED VASOMOTOR SYMPTOMS, HOT FLASHES 12/26/2009  . BACK PAIN, LUMBAR, WITH RADICULOPATHY 06/17/2009  . NOCTURIA 05/06/2008  . Depression 03/02/2007    Jeral Pinch PT  11/28/2019, 1:40 PM  [place], [place], [address] Phone: [phone]   Fax:  [phone]  Name: Kathy Bishop  MRN: [id number] Date of Birth: 06/09/1951

## 2019-11-28 NOTE — Patient Instructions (Signed)
Access Code: [email or web address]  URL: [email or web address]  Date: 11/28/2019  Prepared by: Jeral Pinch   Exercises Prone Alternating Arm and Leg Lifts - 10 reps - 1-3 sets - 1x daily Cat Cow - 10 reps - 1x daily Cat to Child's Pose with Posterior Pelvic Tilt - 1 reps - 20-30 hold - 1x daily Supine Lower Trunk Rotation - 10 reps - 3-5 hold - 1x daily

## 2019-11-29 ENCOUNTER — Other Ambulatory Visit: Payer: Self-pay

## 2019-11-29 MED ORDER — PROMETHAZINE HCL 25 MG PO TABS: 25.0000 mg | ORAL_TABLET | Freq: Three times a day (TID) | ORAL | 2 refills | Status: DC | PRN

## 2019-12-11 ENCOUNTER — Other Ambulatory Visit: Payer: Self-pay

## 2019-12-11 ENCOUNTER — Ambulatory Visit: Payer: PPO | Attending: Family Medicine | Admitting: Physical Therapy

## 2019-12-11 DIAGNOSIS — G8929 Other chronic pain: Secondary | ICD-10-CM | POA: Diagnosis not present

## 2019-12-11 DIAGNOSIS — M62830 Muscle spasm of back: Secondary | ICD-10-CM

## 2019-12-11 DIAGNOSIS — M545 Low back pain: Secondary | ICD-10-CM | POA: Diagnosis not present

## 2019-12-11 DIAGNOSIS — M6281 Muscle weakness (generalized): Secondary | ICD-10-CM | POA: Insufficient documentation

## 2019-12-11 NOTE — Therapy (Signed)
[place], [place], [address] Phone: [phone]   Fax:  [phone]  Physical Therapy Treatment  Patient Details  Name: Kathy Bishop MRN: [id number] Date of Birth: 01-23-1951 Referring Provider (PT): Dr Arnette Norris   Encounter Date: 12/11/2019  PT End of Session - 12/11/19 1134    Visit Number  2    Number of Visits  8    Date for PT Re-Evaluation  12/26/19    PT Start Time  1100    PT Stop Time  1145    PT Time Calculation (min)  45 min       Past Medical History:  Diagnosis Date  . Anxiety   . Breast cancer ([place])   . Depression   . Dermatitis    allergic  . Foot pain, right   . History of radiation therapy 12/20/16- 01/17/17   Left Breast 40.05 Gy in 15 fractions and a 10 Gy boost in 5 fractions.   . Malignant neoplasm of upper-outer quadrant of left breast in female, estrogen receptor positive ([place]) 08/31/2016  . Need for prophylactic hormone replacement therapy (postmenopausal)   . Nocturia   . Personal history of radiation therapy   . Routine general medical examination at a health care facility   . Symptomatic menopausal or female climacteric states   . Thoracic or lumbosacral neuritis or radiculitis, unspecified     Past Surgical History:  Procedure Laterality Date  . BREAST LUMPECTOMY Left   . BREAST LUMPECTOMY WITH SENTINEL LYMPH NODE BIOPSY Left 11/09/2016   Procedure: LEFT BREAST LUMPECTOMY WITH SENTINEL LYMPH NODE BX;  Surgeon: Coralie Keens, MD;  Location: [facility];  Service: General;  Laterality: Left;  . TONSILLECTOMY     age 18    There were no vitals filed for this visit.  Subjective Assessment - 12/11/19 1059    Subjective  back is fair, doing HEP - but not as much as I should    Currently in Pain?  Yes    Pain Score  5     Pain Location  Back    Pain Orientation  Left                       [facility] Adult PT Treatment/Exercise -  12/11/19 0001      Lumbar Exercises: Aerobic   Nustep  L 5 5 min      Lumbar Exercises: Standing   Row  Both;15 reps;Theraband    Theraband Level (Row)  Level 2 (Red)    Shoulder Extension  Strengthening;Both;15 reps;Theraband    Theraband Level (Shoulder Extension)  Level 2 (Red)    Other Standing Lumbar Exercises  hip ext and abd 15 BIL   with UE support and cuing     Lumbar Exercises: Supine   Ab Set  15 reps;3 seconds   isometric with ball   Bridge with [employer]  10 reps;Compliant;2 seconds    Bridge with clamshell  15 reps;2 seconds   tband   Bridge with March  Compliant;15 reps;2 seconds   tband   Other Supine Lumbar Exercises  feet on ball bridge,KTC and obl 10 each      Modalities   Modalities  Electrical Stimulation;Moist Heat      Moist Heat Therapy   Number Minutes Moist Heat  15 Minutes    Moist Heat Location  Lumbar Spine      Electrical Stimulation  Electrical Stimulation Location  Left LB/SI    Electrical Stimulation Action  IFC    Electrical Stimulation Parameters  supne    Electrical Stimulation Goals  Pain   tightness     Manual Therapy   Manual Therapy  Soft tissue mobilization;Passive ROM    Soft tissue mobilization  very tender Left SI, limited tolerance    Passive ROM  PROM left hip, issued piriformis for HEP             PT Education - 12/11/19 1133    Education Details  piriformis stretch    Person(s) Educated  Patient    Methods  Explanation;Demonstration;Handout    Comprehension  Verbalized understanding;Returned demonstration          PT Long Term Goals - 11/28/19 1333      PT LONG TERM GOAL #1   Title  I with advanced HEP ( 12/26/2019)    Time  4    Period  Weeks    Status  New    Target Date  12/26/19      PT LONG TERM GOAL #2   Title  demo lumbar ROM full and painfree to allow her to move and play with her grandnephew ( 12/26/2019)    Time  4    Period  Weeks    Status  New    Target Date  12/26/19      PT  LONG TERM GOAL #3   Title  [name] FOTO =/< 31% limited ( 12/26/2019)    Time  4    Period  Weeks    Status  New    Target Date  12/26/19            Plan - 12/11/19 1134    Clinical Impression Statement  pt tolerated initial ther ex well, needed cuing for ppt and core actviiation with supien ex. very tender and tight left SI area-issued piriformis stretch and estim and MH as she had limited tolerance to STW    PT Treatment/Interventions  Taping;Patient/family education;Functional mobility training;Moist Heat;Ultrasound;Therapeutic activities;Passive range of motion;Therapeutic exercise;Cryotherapy;Electrical Stimulation;Manual techniques;Dry needling    PT Next Visit Plan  work on lumbar/SI mobility, DN to low back if needed, hip and abdominal strengtheing       Patient will benefit from skilled therapeutic intervention in order to improve the following deficits and impairments:  Decreased range of motion, Pain, Increased muscle spasms, Decreased strength  Visit Diagnosis: Muscle spasm of back  Chronic bilateral low back pain without sciatica  Muscle weakness (generalized)     Problem List Patient Active Problem List   Diagnosis Date Noted  . Insomnia 11/13/2019  . Back pain 11/13/2019  . Rectal bleeding 08/30/2018  . Unintended weight loss 08/30/2018  . Nausea 08/30/2018  . Rash 04/28/2017  . Malignant neoplasm of upper-outer quadrant of left breast in female, estrogen receptor positive ([place]) 08/31/2016  . BPV (benign positional vertigo) 03/09/2013  . Fatigue 02/15/2012  . Leukopenia 02/15/2012  . MENOPAUSE-RELATED VASOMOTOR SYMPTOMS, HOT FLASHES 12/26/2009  . BACK PAIN, LUMBAR, WITH RADICULOPATHY 06/17/2009  . NOCTURIA 05/06/2008  . Depression 03/02/2007    Mamie Diiorio,ANGIE  PTA 12/11/2019, 11:37 AM  [place], [place], [address] Phone: [phone]   Fax:  [phone]  Name:  Mikala Gochnour MRN: [id number] Date of Birth: December 03, 1950

## 2019-12-13 ENCOUNTER — Other Ambulatory Visit: Payer: Self-pay

## 2019-12-13 ENCOUNTER — Ambulatory Visit: Payer: PPO | Admitting: Physical Therapy

## 2019-12-13 DIAGNOSIS — M6281 Muscle weakness (generalized): Secondary | ICD-10-CM

## 2019-12-13 DIAGNOSIS — M62830 Muscle spasm of back: Secondary | ICD-10-CM | POA: Diagnosis not present

## 2019-12-13 DIAGNOSIS — G8929 Other chronic pain: Secondary | ICD-10-CM

## 2019-12-13 DIAGNOSIS — M545 Low back pain, unspecified: Secondary | ICD-10-CM

## 2019-12-13 NOTE — Patient Instructions (Signed)

## 2019-12-13 NOTE — Therapy (Signed)
[place], [place], [address] Phone: [phone]   Fax:  [phone]  Physical Therapy Treatment  Patient Details  Name: Kathy Bishop MRN: [id number] Date of Birth: [birth year] Referring Provider (PT): Dr Arnette Norris   Encounter Date: 12/13/2019  PT End of Session - 12/13/19 1126    Visit Number  3    Date for PT Re-Evaluation  12/26/19    PT Start Time  1100    PT Stop Time  1152    PT Time Calculation (min)  52 min       Past Medical History:  Diagnosis Date  . Anxiety   . Breast cancer ([place])   . Depression   . Dermatitis    allergic  . Foot pain, right   . History of radiation therapy 12/20/16- 01/17/17   Left Breast 40.05 Gy in 15 fractions and a 10 Gy boost in 5 fractions.   . Malignant neoplasm of upper-outer quadrant of left breast in female, estrogen receptor positive ([place]) 08/31/2016  . Need for prophylactic hormone replacement therapy (postmenopausal)   . Nocturia   . Personal history of radiation therapy   . Routine general medical examination at a health care facility   . Symptomatic menopausal or female climacteric states   . Thoracic or lumbosacral neuritis or radiculitis, unspecified     Past Surgical History:  Procedure Laterality Date  . BREAST LUMPECTOMY Left   . BREAST LUMPECTOMY WITH SENTINEL LYMPH NODE BIOPSY Left 11/09/2016   Procedure: LEFT BREAST LUMPECTOMY WITH SENTINEL LYMPH NODE BX;  Surgeon: Coralie Keens, MD;  Location: [facility];  Service: General;  Laterality: Left;  . TONSILLECTOMY     age 68    There were no vitals filed for this visit.  Subjective Assessment - 12/13/19 1102    Subjective  maybe alittle better, tired and painful at end of day. stretches are difficult. I read about DN and would like to try it    Currently in Pain?  Yes    Pain Score  4     Pain Location  Back    Pain Orientation  Left                        [facility] Adult PT Treatment/Exercise - 12/13/19 0001      Lumbar Exercises: Aerobic   Nustep  L 5 6 min      Lumbar Exercises: Machines for Strengthening   Cybex Lumbar Extension  black tband 2 sets 10    Leg Press  20# 2 sets 12   isometric abdominals 15 x 3 sec   Other Lumbar Machine Exercise  Lat and row 20# 2 sets 10      Lumbar Exercises: Standing   Other Standing Lumbar Exercises  hip ext and abd 15 BIL      Modalities   Modalities  Electrical Stimulation;Moist Heat      Moist Heat Therapy   Number Minutes Moist Heat  15 Minutes    Moist Heat Location  Lumbar Spine      Electrical Stimulation   Electrical Stimulation Location  Left LB/SI    Electrical Stimulation Action  IFC    Electrical Stimulation Parameters  supien    Electrical Stimulation Goals  Pain                  PT Long Term Goals - 12/13/19 1127  PT LONG TERM GOAL #1   Title  I with advanced HEP ( 12/26/2019)    Status  On-going      PT LONG TERM GOAL #2   Title  demo lumbar ROM full and painfree to allow her to move and play with her grandnephew ( 12/26/2019)    Status  On-going      PT LONG TERM GOAL #3   Title  [name] FOTO =/< 31% limited ( 12/26/2019)    Status  On-going            Plan - 12/13/19 1127    Clinical Impression Statement  progressed ex and did well with some cuing. no increase in pain with ex. very tender with palpation so tried DN for pain/tightness. progressing towards goals    PT Treatment/Interventions  Taping;Patient/family education;Functional mobility training;Moist Heat;Ultrasound;Therapeutic activities;Passive range of motion;Therapeutic exercise;Cryotherapy;Electrical Stimulation;Manual techniques;Dry needling    PT Next Visit Plan  work on lumbar/SI mobility, DN to low back if needed, hip and abdominal strengtheing       Patient will benefit from skilled therapeutic intervention in order to improve the following  deficits and impairments:  Decreased range of motion, Pain, Increased muscle spasms, Decreased strength  Visit Diagnosis: Muscle spasm of back  Chronic bilateral low back pain without sciatica  Muscle weakness (generalized)     Problem List Patient Active Problem List   Diagnosis Date Noted  . Insomnia 11/13/2019  . Back pain 11/13/2019  . Rectal bleeding 08/30/2018  . Unintended weight loss 08/30/2018  . Nausea 08/30/2018  . Rash 04/28/2017  . Malignant neoplasm of upper-outer quadrant of left breast in female, estrogen receptor positive ([facility]) 08/31/2016  . BPV (benign positional vertigo) 03/09/2013  . Fatigue 02/15/2012  . Leukopenia 02/15/2012  . MENOPAUSE-RELATED VASOMOTOR SYMPTOMS, HOT FLASHES 12/26/2009  . BACK PAIN, LUMBAR, WITH RADICULOPATHY 06/17/2009  . NOCTURIA 05/06/2008  . Depression 03/02/2007    Kjersti Dittmer,ANGIE PTA 12/13/2019, 11:29 AM  [place], [place], [address] Phone: [phone]   Fax:  [phone]  Name: Kathy Bishop MRN: [id number] Date of Birth: [birth year]

## 2019-12-18 ENCOUNTER — Ambulatory Visit: Payer: PPO | Admitting: Physical Therapy

## 2019-12-18 ENCOUNTER — Other Ambulatory Visit: Payer: Self-pay

## 2019-12-18 DIAGNOSIS — G8929 Other chronic pain: Secondary | ICD-10-CM

## 2019-12-18 DIAGNOSIS — M62830 Muscle spasm of back: Secondary | ICD-10-CM

## 2019-12-18 DIAGNOSIS — M545 Low back pain, unspecified: Secondary | ICD-10-CM

## 2019-12-18 DIAGNOSIS — M6281 Muscle weakness (generalized): Secondary | ICD-10-CM

## 2019-12-18 NOTE — Therapy (Signed)
[facility], [place], [address] Phone: [phone]   Fax:  [phone]  Physical Therapy Treatment  Patient Details  Name: Naema Mantis MRN: [id number] Date of Birth: 16-Jun-1951 Referring Provider (PT): Dr Arnette Norris   Encounter Date: 12/18/2019  PT End of Session - 12/18/19 1045    Visit Number  4    Number of Visits  8    Date for PT Re-Evaluation  12/26/19    PT Start Time  [id number]    PT Stop Time  1110    PT Time Calculation (min)  55 min       Past Medical History:  Diagnosis Date  . Anxiety   . Breast cancer ([facility])   . Depression   . Dermatitis    allergic  . Foot pain, right   . History of radiation therapy 12/20/16- 01/17/17   Left Breast 40.05 Gy in 15 fractions and a 10 Gy boost in 5 fractions.   . Malignant neoplasm of upper-outer quadrant of left breast in female, estrogen receptor positive ([facility]) 08/31/2016  . Need for prophylactic hormone replacement therapy (postmenopausal)   . Nocturia   . Personal history of radiation therapy   . Routine general medical examination at a health care facility   . Symptomatic menopausal or female climacteric states   . Thoracic or lumbosacral neuritis or radiculitis, unspecified     Past Surgical History:  Procedure Laterality Date  . BREAST LUMPECTOMY Left   . BREAST LUMPECTOMY WITH SENTINEL LYMPH NODE BIOPSY Left 11/09/2016   Procedure: LEFT BREAST LUMPECTOMY WITH SENTINEL LYMPH NODE BX;  Surgeon: Coralie Keens, MD;  Location: [facility];  Service: General;  Laterality: Left;  . TONSILLECTOMY     age 7    There were no vitals filed for this visit.  Subjective Assessment - 12/18/19 1019    Subjective  I think last session helped    Currently in Pain?  Yes    Pain Score  3     Pain Location  Back    Pain Orientation  Left                       [facility] Adult PT Treatment/Exercise - 12/18/19 0001      Lumbar Exercises: Machines for Strengthening   Leg Press  30# 2 sets 12      Lumbar Exercises: Standing   Row  Strengthening;Power tower;10 reps   10#   Shoulder Extension  Strengthening;Power Tower;10 reps   10#   Other Standing Lumbar Exercises  resisted gait fwd/back and SW 3 x each    Other Standing Lumbar Exercises  hip ext and abd 10 x red tband      Lumbar Exercises: Seated   Sit to Stand  10 reps   with OH ball      Modalities   Modalities  Electrical Stimulation;Moist Heat      Moist Heat Therapy   Number Minutes Moist Heat  15 Minutes    Moist Heat Location  Lumbar Spine      Electrical Stimulation   Electrical Stimulation Location  Left LB/SI    Electrical Stimulation Action  IFC    Electrical Stimulation Parameters  supine    Electrical Stimulation Goals  Pain      Manual Therapy   Passive ROM  PROM left hip, issued piriformis for HEP  PT Long Term Goals - 12/13/19 1127      PT LONG TERM GOAL #1   Title  I with advanced HEP ( 12/26/2019)    Status  On-going      PT LONG TERM GOAL #2   Title  demo lumbar ROM full and painfree to allow her to move and play with her grandnephew ( 12/26/2019)    Status  On-going      PT LONG TERM GOAL #3   Title  [name] FOTO =/< 31% limited ( 12/26/2019)    Status  On-going            Plan - 12/18/19 1045    Clinical Impression Statement  less pain and increased ROM after DN. progressing ex of LE and stab of SI- some cuing needed. pt responding well to estim/MH    PT Treatment/Interventions  Taping;Patient/family education;Functional mobility training;Moist Heat;Ultrasound;Therapeutic activities;Passive range of motion;Therapeutic exercise;Cryotherapy;Electrical Stimulation;Manual techniques;Dry needling    PT Next Visit Plan  work on lumbar/SI mobility, DN to low back if needed, hip and abdominal strengtheing       Patient will benefit from skilled therapeutic intervention in order to  improve the following deficits and impairments:  Decreased range of motion, Pain, Increased muscle spasms, Decreased strength  Visit Diagnosis: Chronic bilateral low back pain without sciatica  Muscle spasm of back  Muscle weakness (generalized)     Problem List Patient Active Problem List   Diagnosis Date Noted  . Insomnia 11/13/2019  . Back pain 11/13/2019  . Rectal bleeding 08/30/2018  . Unintended weight loss 08/30/2018  . Nausea 08/30/2018  . Rash 04/28/2017  . Malignant neoplasm of upper-outer quadrant of left breast in female, estrogen receptor positive ([facility]) 08/31/2016  . BPV (benign positional vertigo) 03/09/2013  . Fatigue 02/15/2012  . Leukopenia 02/15/2012  . MENOPAUSE-RELATED VASOMOTOR SYMPTOMS, HOT FLASHES 12/26/2009  . BACK PAIN, LUMBAR, WITH RADICULOPATHY 06/17/2009  . NOCTURIA 05/06/2008  . Depression 03/02/2007    Kejuan Bekker,ANGIE PTA 12/18/2019, 10:50 AM  [place], [place], [address] Phone: [phone]   Fax:  [phone]  Name: Rosalin Causer MRN: [id number] Date of Birth: 1951/02/24

## 2019-12-20 ENCOUNTER — Ambulatory Visit: Payer: PPO | Admitting: Physical Therapy

## 2019-12-20 ENCOUNTER — Other Ambulatory Visit: Payer: Self-pay

## 2019-12-20 DIAGNOSIS — G8929 Other chronic pain: Secondary | ICD-10-CM

## 2019-12-20 DIAGNOSIS — M6281 Muscle weakness (generalized): Secondary | ICD-10-CM

## 2019-12-20 DIAGNOSIS — M62830 Muscle spasm of back: Secondary | ICD-10-CM

## 2019-12-20 NOTE — Therapy (Signed)
[facility] [place], [place], [address] Phone: [phone]   Fax:  [phone]  Physical Therapy Treatment  Patient Details  Name: Kathy Bishop MRN: [id number] Date of Birth: 10/16/1950 Referring Provider (PT): Dr Arnette Norris   Encounter Date: 12/20/2019  PT End of Session - 12/20/19 1135    Visit Number  5    Number of Visits  8    Date for PT Re-Evaluation  12/26/19    PT Start Time  1100    PT Stop Time  1135    PT Time Calculation (min)  35 min       Past Medical History:  Diagnosis Date  . Anxiety   . Breast cancer ([place])   . Depression   . Dermatitis    allergic  . Foot pain, right   . History of radiation therapy 12/20/16- 01/17/17   Left Breast 40.05 Gy in 15 fractions and a 10 Gy boost in 5 fractions.   . Malignant neoplasm of upper-outer quadrant of left breast in female, estrogen receptor positive ([place]) 08/31/2016  . Need for prophylactic hormone replacement therapy (postmenopausal)   . Nocturia   . Personal history of radiation therapy   . Routine general medical examination at a health care facility   . Symptomatic menopausal or female climacteric states   . Thoracic or lumbosacral neuritis or radiculitis, unspecified     Past Surgical History:  Procedure Laterality Date  . BREAST LUMPECTOMY Left   . BREAST LUMPECTOMY WITH SENTINEL LYMPH NODE BIOPSY Left 11/09/2016   Procedure: LEFT BREAST LUMPECTOMY WITH SENTINEL LYMPH NODE BX;  Surgeon: Coralie Keens, MD;  Location: [facility];  Service: General;  Laterality: Left;  . TONSILLECTOMY     age 69    There were no vitals filed for this visit.  Subjective Assessment - 12/20/19 1057    Subjective  i really feel good    Currently in Pain?  No/denies                       [facility] Adult PT Treatment/Exercise - 12/20/19 0001      Lumbar Exercises: Stretches   Active Hamstring Stretch  Left;3 reps;20  seconds      Lumbar Exercises: Aerobic   Elliptical  2 fwd/2 back I 7 R 4      Lumbar Exercises: Machines for Strengthening   Cybex Lumbar Extension  black tband 2 sets 10    Leg Press  30# 2 sets 15   30# 2 sets 10   Other Lumbar Machine Exercise  Lat and row 20# 2 sets 10      Lumbar Exercises: Supine   Other Supine Lumbar Exercises  SI stab ex      Manual Therapy   Passive ROM  PROM left hip             PT Education - 12/20/19 1135    Education Details  SI/core stab    Person(s) Educated  Patient    Methods  Explanation;Demonstration;Handout    Comprehension  Verbalized understanding;Returned demonstration          PT Long Term Goals - 12/20/19 1135      PT LONG TERM GOAL #1   Title  I with advanced HEP ( 12/26/2019)    Status  Partially Met      PT LONG TERM GOAL #2   Title  demo lumbar ROM full  and painfree to allow her to move and play with her grandnephew ( 12/26/2019)    Status  Partially Met      PT LONG TERM GOAL #3   Title  [name] FOTO =/< 31% limited ( 12/26/2019)    Status  On-going            Plan - 12/20/19 1136    Clinical Impression Statement  no pain so progressed ex and increased HEP for SI stab and added HS stretch. progressing with goals    PT Treatment/Interventions  Taping;Patient/family education;Functional mobility training;Moist Heat;Ultrasound;Therapeutic activities;Passive range of motion;Therapeutic exercise;Cryotherapy;Electrical Stimulation;Manual techniques;Dry needling    PT Next Visit Plan  assess and progress       Patient will benefit from skilled therapeutic intervention in order to improve the following deficits and impairments:  Decreased range of motion, Pain, Increased muscle spasms, Decreased strength  Visit Diagnosis: Chronic bilateral low back pain without sciatica  Muscle spasm of back  Muscle weakness (generalized)     Problem List Patient Active Problem List   Diagnosis Date Noted  . Insomnia  11/13/2019  . Back pain 11/13/2019  . Rectal bleeding 08/30/2018  . Unintended weight loss 08/30/2018  . Nausea 08/30/2018  . Rash 04/28/2017  . Malignant neoplasm of upper-outer quadrant of left breast in female, estrogen receptor positive ([facility]) 08/31/2016  . BPV (benign positional vertigo) 03/09/2013  . Fatigue 02/15/2012  . Leukopenia 02/15/2012  . MENOPAUSE-RELATED VASOMOTOR SYMPTOMS, HOT FLASHES 12/26/2009  . BACK PAIN, LUMBAR, WITH RADICULOPATHY 06/17/2009  . NOCTURIA 05/06/2008  . Depression 03/02/2007    Hondo Nanda,ANGIE PTA 12/20/2019, 11:40 AM  [place], [place], [address] Phone: [phone]   Fax:  [phone]  Name: Kathy Bishop MRN: [id number] Date of Birth: 1951/10/04

## 2019-12-25 ENCOUNTER — Ambulatory Visit: Payer: PPO | Admitting: Physical Therapy

## 2019-12-25 ENCOUNTER — Other Ambulatory Visit: Payer: Self-pay

## 2019-12-25 DIAGNOSIS — M62830 Muscle spasm of back: Secondary | ICD-10-CM

## 2019-12-25 DIAGNOSIS — M6281 Muscle weakness (generalized): Secondary | ICD-10-CM

## 2019-12-25 DIAGNOSIS — G8929 Other chronic pain: Secondary | ICD-10-CM

## 2019-12-25 NOTE — Therapy (Signed)
[place], [place], [address] Phone: [phone]   Fax:  [phone]  Physical Therapy Treatment  Patient Details  Name: Kathy Bishop MRN: [id number] Date of Birth: 20-Dec-1950 Referring Provider (PT): Dr Arnette Norris   Encounter Date: 12/25/2019  PT End of Session - 12/25/19 1129    Visit Number  6    Date for PT Re-Evaluation  12/26/19    PT Start Time  1100    PT Stop Time  1150    PT Time Calculation (min)  50 min       Past Medical History:  Diagnosis Date  . Anxiety   . Breast cancer ([facility])   . Depression   . Dermatitis    allergic  . Foot pain, right   . History of radiation therapy 12/20/16- 01/17/17   Left Breast 40.05 Gy in 15 fractions and a 10 Gy boost in 5 fractions.   . Malignant neoplasm of upper-outer quadrant of left breast in female, estrogen receptor positive ([place]) 08/31/2016  . Need for prophylactic hormone replacement therapy (postmenopausal)   . Nocturia   . Personal history of radiation therapy   . Routine general medical examination at a health care facility   . Symptomatic menopausal or female climacteric states   . Thoracic or lumbosacral neuritis or radiculitis, unspecified     Past Surgical History:  Procedure Laterality Date  . BREAST LUMPECTOMY Left   . BREAST LUMPECTOMY WITH SENTINEL LYMPH NODE BIOPSY Left 11/09/2016   Procedure: LEFT BREAST LUMPECTOMY WITH SENTINEL LYMPH NODE BX;  Surgeon: Coralie Keens, MD;  Location: [facility];  Service: General;  Laterality: Left;  . TONSILLECTOMY     age 69    There were no vitals filed for this visit.  Subjective Assessment - 12/25/19 1106    Subjective  not as good as last time, back slightly in same spot    Currently in Pain?  Yes    Pain Location  Back    Pain Orientation  Left                       [facility] Adult PT Treatment/Exercise - 12/25/19 0001      Lumbar Exercises:  Machines for Strengthening   Leg Press  40 # 2 sets 15   bridge with abll squeeze 15, SL bridge 10 each     Lumbar Exercises: Standing   Other Standing Lumbar Exercises  6 in lat step up with abd 10 BIL   6 in fwd step up with ext 10 BIL   Other Standing Lumbar Exercises  hip 4 way with cable pulley 10 each      Modalities   Modalities  Electrical Stimulation;Moist Heat      Moist Heat Therapy   Number Minutes Moist Heat  15 Minutes    Moist Heat Location  Lumbar Spine      Electrical Stimulation   Electrical Stimulation Location  Left LB/SI    Electrical Stimulation Action  IFC    Electrical Stimulation Parameters  supine    Electrical Stimulation Goals  Pain      Manual Therapy   Passive ROM  PROM left hip   tightness when gapping left SI                 PT Long Term Goals - 12/25/19 1131      PT LONG TERM GOAL #1  Title  I with advanced HEP ( 12/26/2019)    Status  Partially Met      PT LONG TERM GOAL #2   Title  demo lumbar ROM full and painfree to allow her to move and play with her grandnephew ( 12/26/2019)    Status  Partially Met      PT LONG TERM GOAL #3   Title  [name] FOTO =/< 31% limited ( 12/26/2019)    Status  Partially Met            Plan - 12/25/19 1129    Clinical Impression Statement  continue to progress with SI stab. pain and tightness in Left SI - responded well to DN and estim/MH. tolerated ex well with some cuing,verb "felt that area" but tolerable    PT Treatment/Interventions  Taping;Patient/family education;Functional mobility training;Moist Heat;Ultrasound;Therapeutic activities;Passive range of motion;Therapeutic exercise;Cryotherapy;Electrical Stimulation;Manual techniques;Dry needling       Patient will benefit from skilled therapeutic intervention in order to improve the following deficits and impairments:  Decreased range of motion, Pain, Increased muscle spasms, Decreased strength  Visit Diagnosis: Chronic bilateral  low back pain without sciatica  Muscle spasm of back  Muscle weakness (generalized)     Problem List Patient Active Problem List   Diagnosis Date Noted  . Insomnia 11/13/2019  . Back pain 11/13/2019  . Rectal bleeding 08/30/2018  . Unintended weight loss 08/30/2018  . Nausea 08/30/2018  . Rash 04/28/2017  . Malignant neoplasm of upper-outer quadrant of left breast in female, estrogen receptor positive ([facility]) 08/31/2016  . BPV (benign positional vertigo) 03/09/2013  . Fatigue 02/15/2012  . Leukopenia 02/15/2012  . MENOPAUSE-RELATED VASOMOTOR SYMPTOMS, HOT FLASHES 12/26/2009  . BACK PAIN, LUMBAR, WITH RADICULOPATHY 06/17/2009  . NOCTURIA 05/06/2008  . Depression 03/02/2007    Isaac Dubie,ANGIE PTA 12/25/2019, 11:32 AM  [place], [place], [address] Phone: [phone]   Fax:  [phone]  Name: Annica Marinello MRN: [id number] Date of Birth: Sep 06, 1951

## 2019-12-27 ENCOUNTER — Other Ambulatory Visit: Payer: Self-pay

## 2019-12-27 ENCOUNTER — Ambulatory Visit: Payer: PPO | Admitting: Physical Therapy

## 2019-12-27 DIAGNOSIS — M62830 Muscle spasm of back: Secondary | ICD-10-CM | POA: Diagnosis not present

## 2019-12-27 DIAGNOSIS — M545 Low back pain, unspecified: Secondary | ICD-10-CM

## 2019-12-27 DIAGNOSIS — M6281 Muscle weakness (generalized): Secondary | ICD-10-CM

## 2019-12-27 DIAGNOSIS — G8929 Other chronic pain: Secondary | ICD-10-CM

## 2019-12-27 NOTE — Therapy (Signed)
[facility] [place], [place], [address] Phone: [phone]   Fax:  [phone]  Physical Therapy Treatment  Patient Details  Name: Kathy Bishop MRN: [id number] Date of Birth: [birth year] Referring Provider (PT): Dr Arnette Norris   Encounter Date: 12/27/2019  PT End of Session - 12/27/19 1128    Visit Number  7    Date for PT Re-Evaluation  01/26/20    PT Start Time  1100    PT Stop Time  1145    PT Time Calculation (min)  45 min       Past Medical History:  Diagnosis Date  . Anxiety   . Breast cancer ([facility])   . Depression   . Dermatitis    allergic  . Foot pain, right   . History of radiation therapy 12/20/16- 01/17/17   Left Breast 40.05 Gy in 15 fractions and a 10 Gy boost in 5 fractions.   . Malignant neoplasm of upper-outer quadrant of left breast in female, estrogen receptor positive ([facility]) 08/31/2016  . Need for prophylactic hormone replacement therapy (postmenopausal)   . Nocturia   . Personal history of radiation therapy   . Routine general medical examination at a health care facility   . Symptomatic menopausal or female climacteric states   . Thoracic or lumbosacral neuritis or radiculitis, unspecified     Past Surgical History:  Procedure Laterality Date  . BREAST LUMPECTOMY Left   . BREAST LUMPECTOMY WITH SENTINEL LYMPH NODE BIOPSY Left 11/09/2016   Procedure: LEFT BREAST LUMPECTOMY WITH SENTINEL LYMPH NODE BX;  Surgeon: Coralie Keens, MD;  Location: [facility];  Service: General;  Laterality: Left;  . TONSILLECTOMY     age 19    There were no vitals filed for this visit.  Subjective Assessment - 12/27/19 1058    Subjective  fair, just sore in that same spot    Currently in Pain?  Yes    Pain Score  3     Pain Location  Back    Pain Orientation  Left                       [facility] Adult PT Treatment/Exercise - 12/27/19 0001      Modalities   Modalities  Electrical Stimulation;Moist Heat;Ultrasound;Iontophoresis      Moist Heat Therapy   Number Minutes Moist Heat  15 Minutes    Moist Heat Location  Lumbar Spine      Electrical Stimulation   Electrical Stimulation Location  Left LB/SI    Electrical Stimulation Action  IFC    Electrical Stimulation Parameters  prone    Electrical Stimulation Goals  Pain      Ultrasound   Ultrasound Location  Left SI with PTA overpressure for STW    Ultrasound Parameters  1.4 w/cm2 8 min 100%     Ultrasound Goals  Pain;Edema      Iontophoresis   Type of Iontophoresis  Dexamethasone    Location  Left SI    Dose  1.1 cc    Time  4 hour leave on patch                  PT Long Term Goals - 12/25/19 1131      PT LONG TERM GOAL #1   Title  I with advanced HEP ( 12/26/2019)    Status  Partially Met      PT LONG TERM  GOAL #2   Title  demo lumbar ROM full and painfree to allow her to move and play with her grandnephew ( 12/26/2019)    Status  Partially Met      PT LONG TERM GOAL #3   Title  [name] FOTO =/< 31% limited ( 12/26/2019)    Status  Partially Met            Plan - 12/27/19 1128    Clinical Impression Statement  pt has been better, less pain with increased ROM but pain isolated in Left SI so tx today for inflammation and pain direct in joint prior to her leaving for vacationa d nwill reasses pain/issues at return. progressing with goals    PT Treatment/Interventions  Taping;Patient/family education;Functional mobility training;Moist Heat;Ultrasound;Therapeutic activities;Passive range of motion;Therapeutic exercise;Cryotherapy;Electrical Stimulation;Manual techniques;Dry needling    PT Next Visit Plan  assess and progress after returns from vacation       Patient will benefit from skilled therapeutic intervention in order to improve the following deficits and impairments:  Decreased range of motion, Pain, Increased muscle spasms, Decreased strength  Visit  Diagnosis: Chronic bilateral low back pain without sciatica  Muscle spasm of back  Muscle weakness (generalized)     Problem List Patient Active Problem List   Diagnosis Date Noted  . Insomnia 11/13/2019  . Back pain 11/13/2019  . Rectal bleeding 08/30/2018  . Unintended weight loss 08/30/2018  . Nausea 08/30/2018  . Rash 04/28/2017  . Malignant neoplasm of upper-outer quadrant of left breast in female, estrogen receptor positive ([facility]) 08/31/2016  . BPV (benign positional vertigo) 03/09/2013  . Fatigue 02/15/2012  . Leukopenia 02/15/2012  . MENOPAUSE-RELATED VASOMOTOR SYMPTOMS, HOT FLASHES 12/26/2009  . BACK PAIN, LUMBAR, WITH RADICULOPATHY 06/17/2009  . NOCTURIA 05/06/2008  . Depression 03/02/2007    Gamble Bishop,Kathy PTA 12/27/2019, 11:37 AM  [place], [place], [address] Phone: [phone]   Fax:  [phone]  Name: Kathy Bishop MRN: [id number] Date of Birth: 04/21/1951

## 2020-01-01 ENCOUNTER — Encounter: Payer: PPO | Admitting: Physical Therapy

## 2020-01-03 ENCOUNTER — Encounter: Payer: PPO | Admitting: Physical Therapy

## 2020-01-10 ENCOUNTER — Other Ambulatory Visit: Payer: Self-pay | Admitting: Oncology

## 2020-01-10 DIAGNOSIS — Z853 Personal history of malignant neoplasm of breast: Secondary | ICD-10-CM

## 2020-01-10 DIAGNOSIS — Z9889 Other specified postprocedural states: Secondary | ICD-10-CM

## 2020-01-18 ENCOUNTER — Ambulatory Visit: Payer: PPO | Attending: Family Medicine | Admitting: Physical Therapy

## 2020-01-18 ENCOUNTER — Other Ambulatory Visit: Payer: Self-pay

## 2020-01-18 DIAGNOSIS — M545 Low back pain, unspecified: Secondary | ICD-10-CM

## 2020-01-18 DIAGNOSIS — M62830 Muscle spasm of back: Secondary | ICD-10-CM | POA: Insufficient documentation

## 2020-01-18 DIAGNOSIS — G8929 Other chronic pain: Secondary | ICD-10-CM | POA: Diagnosis not present

## 2020-01-18 MED ORDER — ZOLPIDEM TARTRATE 5 MG PO TABS: ORAL_TABLET | ORAL | 0 refills | Status: DC

## 2020-01-18 NOTE — Therapy (Signed)
[facility] [address] [place], [place], [address] Phone: [phone]   Fax:  [phone]  Physical Therapy Treatment  Patient Details  Name: Kathy Bishop MRN: [id number] Date of Birth: 07/05/1951 Referring Provider (PT): Dr Talia Aron   Encounter Date: 01/18/2020  PT End of Session - 01/18/20 1001    Visit Number  8    Date for PT Re-Evaluation  01/26/20    PT Start Time  0930    PT Stop Time  1025    PT Time Calculation (min)  55 min       Past Medical History:  Diagnosis Date  . Anxiety   . Breast cancer ([facility])   . Depression   . Dermatitis    allergic  . Foot pain, right   . History of radiation therapy 12/20/16- 01/17/17   Left Breast 40.05 Gy in 15 fractions and a 10 Gy boost in 5 fractions.   . Malignant neoplasm of upper-outer quadrant of left breast in female, estrogen receptor positive ([facility]) 08/31/2016  . Need for prophylactic hormone replacement therapy (postmenopausal)   . Nocturia   . Personal history of radiation therapy   . Routine general medical examination at a health care facility   . Symptomatic menopausal or female climacteric states   . Thoracic or lumbosacral neuritis or radiculitis, unspecified     Past Surgical History:  Procedure Laterality Date  . BREAST LUMPECTOMY Left   . BREAST LUMPECTOMY WITH SENTINEL LYMPH NODE BIOPSY Left 11/09/2016   Procedure: LEFT BREAST LUMPECTOMY WITH SENTINEL LYMPH NODE BX;  Surgeon: Douglas Blackman, MD;  Location: [facility];  Service: General;  Laterality: Left;  . TONSILLECTOMY     age 5    There were no vitals filed for this visit.  Subjective Assessment - 01/18/20 0932    Subjective  overall better. started back walking and it definately aggravates it. not doing ex since back from vacation    Currently in Pain?  Yes    Pain Score  2     Pain Location  Back    Pain Orientation  Left                       [facility]  Adult PT Treatment/Exercise - 01/18/20 0001      Lumbar Exercises: Supine   Other Supine Lumbar Exercises  SI stab ex 15 min      Moist Heat Therapy   Number Minutes Moist Heat  15 Minutes    Moist Heat Location  Lumbar Spine      Electrical Stimulation   Electrical Stimulation Location  Left LB/SI    Electrical Stimulation Action  IFC    Electrical Stimulation Parameters  supine    Electrical Stimulation Goals  Pain      Iontophoresis   Type of Iontophoresis  Dexamethasone    Location  Left SI    Dose  1.1 cc    Time  4 hour leave on patch      Manual Therapy   Manual Therapy  Soft tissue mobilization    Soft tissue mobilization  to the left SI area   tender but significantly less than last session 3 weeks ago            PT Education - 01/18/20 0958    Education Details  reviewed HEP for SI stab, discussed walking on more level terrain and gave HAndout of TENS form   [employer]    Person(s) Educated  Patient    Methods  Explanation;Demonstration;Handout    Comprehension  Verbalized understanding;Returned demonstration          PT Long Term Goals - 01/18/20 1000      PT LONG TERM GOAL #1   Title  I with advanced HEP ( 12/26/2019)    Status  Partially Met      PT LONG TERM GOAL #2   Title  demo lumbar ROM full and painfree to allow her to move and play with her grandnephew ( 12/26/2019)    Status  Achieved      PT LONG TERM GOAL #3   Title  /improve FOTO =/< 31% limited ( 12/26/2019)    Status  Partially Met            Plan - 01/18/20 1001    Clinical Impression Statement  pt returns to PT after 3 weeks and away on vacation . verb better but still some issues esp as she has returned back to walking more. pt verb not doing SY stab HEP so reviewed HEP and stressed need to do daily 1 x to strengthen and return to walking more level surfaces. HO for TENS. progressing with goals    PT Treatment/Interventions  Taping;Patient/family education;Functional mobility  training;Moist Heat;Ultrasound;Therapeutic activities;Passive range of motion;Therapeutic exercise;Cryotherapy;Electrical Stimulation;Manual techniques;Dry needling    PT Next Visit Plan  check HEP and TENS. plan to D/C       Patient will benefit from skilled therapeutic intervention in order to improve the following deficits and impairments:  Decreased range of motion, Pain, Increased muscle spasms, Decreased strength  Visit Diagnosis: Chronic bilateral low back pain without sciatica  Muscle spasm of back     Problem List Patient Active Problem List   Diagnosis Date Noted  . Insomnia 11/13/2019  . Back pain 11/13/2019  . Rectal bleeding 08/30/2018  . Unintended weight loss 08/30/2018  . Nausea 08/30/2018  . Rash 04/28/2017  . Malignant neoplasm of upper-outer quadrant of left breast in female, estrogen receptor positive ([facility]) 08/31/2016  . BPV (benign positional vertigo) 03/09/2013  . Fatigue 02/15/2012  . Leukopenia 02/15/2012  . MENOPAUSE-RELATED VASOMOTOR SYMPTOMS, HOT FLASHES 12/26/2009  . BACK PAIN, LUMBAR, WITH RADICULOPATHY 06/17/2009  . NOCTURIA 05/06/2008  . Depression 03/02/2007    PAYSEUR,ANGIE PTA 01/18/2020, 10:03 AM  [facility] [address] [place], [place], [address] Phone: [phone]   Fax:  [phone]  Name: Kathy Bishop MRN: [id number] Date of Birth: 12/27/1950   

## 2020-01-18 NOTE — Telephone Encounter (Signed)
[facility]-Pt has not scheduled with a new provider but I have that put in her SIG to sched with new provider/she is needing a refill of Zolpidem 5mg /plz advise/thx dmf

## 2020-01-23 NOTE — Progress Notes (Signed)
ID: Kathy Bishop DOB: 1950-11-30  MR#: [id number]  MAY#:[id number]  Patient Care Team: Patient, No Pcp Per as PCP - General (General Practice) Kathy Keens, MD as Consulting Physician (General Surgery) Bishop, Kathy Dad, MD as Consulting Physician (Oncology) Kathy Gibson, MD as Attending Physician (Radiation Oncology) Kathy Bison Charlestine Massed, NP as Nurse Practitioner (Hematology and Oncology) OTHER MD:  CHIEF COMPLAINT: Estrogen receptor positive breast cancer  CURRENT TREATMENT: tamoxifen   INTERVAL HISTORY: Kathy Bishop returns today for follow-up for her estrogen receptor positive breast cancer.   At her last visit on 10/16/2019, she was started on Aromasin.  She tolerates that much better than the prior antiestrogens and basically has no significant hot flashes and no arthralgias or myalgias.  The only find the ointment is that it is $90 per month.  If she buys 3 months at a time it is $180.  This is still a significant expense but she tells me she is willing to pay it because of the great improvement in the side effect profile.  She underwent lumbar spine x-ray on 11/14/2019 to evaluate her low back pain. This showed: diffuse multilevel degenerative change; no acute abnormality identified. She started physical therapy for her back on 11/28/2019.  She is already scheduled for routine diagnostic mammography on 02/06/2020.   REVIEW OF SYSTEMS: Sae walks about 4 miles a day about 4 days a week.  She also goes to the gym 4 days a week and does additional exercises there.  She has had both Covid vaccine Therapist, music) doses, with no side effects.  Her husband had the Levan Hurst so they are now in the safe category.  A detailed review of systems today was otherwise stable   BREAST CANCER HISTORY: From the original intake note:  Kathy Bishop (pronounced "Kathy Bishop") reports noticing a mass in her left breast for about a year but did not think to bring this to medical attention, thinking it might be a  cyst. The mass was picked up on routine physical exam by Dr. Deborra Bishop who set up the patient for bilateral screening mammography at the [facility] 08/10/2016. This showed the breast density to be category C. A possible mass was noted in the left breast and on 08/18/2016 she was recalled for left diagnostic mammography with tomography and left breast ultrasonography. This confirmed a 2.9 cm spiculated mass in the upper outer quadrant of the left breast. This was palpable at the 1:00 position 8 cm from the nipple. Ultrasonography found an irregular hypoechoic mass at this site measuring 3.5 cm. The left axilla was sonographically benign.  The patient was then set up for ultrasound-guided biopsy of the left breast mass 08/20/2016. At the time of this ultrasound multiple solid subcentimeter satellite nodules were noted in the upper-outer quadrant of the left breast and one of these was biopsied as well as the main mass. The distance between the clips is 5.0 cm. 4 The pathology from that procedure ([id number]) both showed invasive ductal carcinoma, grade 2, with similar prognostic panels. Specifically the estrogen receptor was 95% and the progesterone receptor 100% for both biopsies, both with strong staining intensity. The MIB-1 was between 10 and 15%. Both tumors were HER-2 negative, with a signals ratio between 0.78 and 1.06 and the number per cell between 1.45 and 1.80  The patient's subsequent history is as detailed below   PAST MEDICAL HISTORY: Past Medical History:  Diagnosis Date  . Anxiety   . Breast cancer ([place])   . Depression   . Dermatitis  allergic  . Foot pain, right   . History of radiation therapy 12/20/16- 01/17/17   Left Breast 40.05 Gy in 15 fractions and a 10 Gy boost in 5 fractions.   . Malignant neoplasm of upper-outer quadrant of left breast in female, estrogen receptor positive ([place]) 08/31/2016  . Need for prophylactic hormone replacement therapy (postmenopausal)   . Nocturia    . Personal history of radiation therapy   . Routine general medical examination at a health care facility   . Symptomatic menopausal or female climacteric states   . Thoracic or lumbosacral neuritis or radiculitis, unspecified     PAST SURGICAL HISTORY: Past Surgical History:  Procedure Laterality Date  . BREAST LUMPECTOMY Left   . BREAST LUMPECTOMY WITH SENTINEL LYMPH NODE BIOPSY Left 11/09/2016   Procedure: LEFT BREAST LUMPECTOMY WITH SENTINEL LYMPH NODE BX;  Surgeon: Kathy Keens, MD;  Location: [facility];  Service: General;  Laterality: Left;  . TONSILLECTOMY     age 39    FAMILY HISTORY:  Family History  Problem Relation Age of Onset  . Alzheimer's disease Mother   . Lung cancer Father        smoker   The patient's father died from lung cancer in the setting of tobacco. Patient's mother died at age 67 with Alzheimer's is the patient has no brothers, 2 sisters. There is no history of breast or ovarian cancer in the family    GYNECOLOGIC HISTORY:  No LMP recorded. Patient is postmenopausal. Menarche age 27, the patient is GX P0. She stopped having periods approximately 2013. She has been on oral contraceptives more than 5 years, stopping in mid-November 2017.   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  She retired April 2019.  Her husband Kathy Bishop is self-employed in [employer] and runs a [employer]. At home is just the 2 of them plus a dog and 3 cats.                          ADVANCED DIRECTIVES: Not in place   HEALTH MAINTENANCE: Social History   Tobacco Use  . Smoking status: Former Research scientist (life sciences)  . Smokeless tobacco: Never Used  . Tobacco comment: smoked years ago in her 20's socially  Substance Use Topics  . Alcohol use: Yes    Comment: social  . Drug use: No    Colonoscopy:  Pap:  Bone Density:  No Known Allergies  Current Outpatient Medications  Medication Sig  Dispense Refill  . escitalopram (LEXAPRO) 20 MG tablet TAKE 1 AND 1/2 TABLETS DAILY BY MOUTH 135 tablet 0  . exemestane (AROMASIN) 25 MG tablet Take 1 tablet (25 mg total) by mouth daily after breakfast. 90 tablet 4  . exemestane (AROMASIN) 25 MG tablet Take 1 tablet (25 mg total) by mouth daily after breakfast. 90 tablet 4  . hydrOXYzine (ATARAX/VISTARIL) 10 MG tablet Take 1 tablet (10 mg total) by mouth 3 (three) times daily as needed. 60 tablet 5  . predniSONE (DELTASONE) 10 MG tablet 3 tabs by mouth x 3 days, 2 tabs by mouth x 2 days, 1 tab by mouth x 2 days and stop. (Patient not taking: Reported on 11/28/2019) 15 tablet 0  . promethazine (PHENERGAN) 25 MG tablet Take 1 tablet (25 mg total) by mouth every 8 (eight) hours as needed for nausea. 60 tablet 2  . zolpidem (AMBIEN) 5 MG tablet  Take 1qhs (Plz sched with new provider) 30 tablet 0   No current facility-administered medications for this visit.    OBJECTIVE: white woman in no acute distress  Vitals:   01/24/20 1045  BP: (!) 151/97  Pulse: 75  Resp: 20  Temp: 98.4 F (36.9 C)  SpO2: 100%     Body mass index is 20.85 kg/m.    ECOG FS:0 - Asymptomatic Filed Weights   01/24/20 1045  Weight: 120 lb 8 oz (54.7 kg)    Sclerae unicteric, EOMs intact Wearing a mask No cervical or supraclavicular adenopathy Lungs no rales or rhonchi Heart regular rate and rhythm Abd soft, nontender, positive bowel sounds MSK no focal spinal tenderness, no upper extremity lymphedema Neuro: nonfocal, well oriented, appropriate affect Breasts: The right breast is unremarkable.  The left breast is status post lumpectomy and radiation.  The cosmetic result is excellent.  There is no evidence of local recurrence.  Both axillae are benign.   LAB RESULTS:  CMP     Component Value Date/Time   NA 140 01/24/2020 1037   NA 140 10/12/2017 1302   K 4.1 01/24/2020 1037   K 4.0 10/12/2017 1302   CL 104 01/24/2020 1037   CO2 26 01/24/2020 1037   CO2  27 10/12/2017 1302   GLUCOSE 118 (H) 01/24/2020 1037   GLUCOSE 101 10/12/2017 1302   BUN 13 01/24/2020 1037   BUN 11.4 10/12/2017 1302   CREATININE 0.86 01/24/2020 1037   CREATININE 0.8 10/12/2017 1302   CALCIUM 9.4 01/24/2020 1037   CALCIUM 9.3 10/12/2017 1302   PROT 7.0 01/24/2020 1037   PROT 6.9 10/12/2017 1302   ALBUMIN 4.5 01/24/2020 1037   ALBUMIN 4.3 10/12/2017 1302   AST 18 01/24/2020 1037   AST 17 10/12/2017 1302   ALT 15 01/24/2020 1037   ALT 13 10/12/2017 1302   ALKPHOS 69 01/24/2020 1037   ALKPHOS 66 10/12/2017 1302   BILITOT 0.6 01/24/2020 1037   BILITOT 0.59 10/12/2017 1302   GFRNONAA >60 01/24/2020 1037   GFRAA >60 01/24/2020 1037    INo results found for: SPEP, UPEP  Lab Results  Component Value Date   WBC 4.3 01/24/2020   NEUTROABS 2.9 01/24/2020   HGB 13.1 01/24/2020   HCT 39.6 01/24/2020   MCV 96.1 01/24/2020   PLT 209 01/24/2020      Chemistry      Component Value Date/Time   NA 140 01/24/2020 1037   NA 140 10/12/2017 1302   K 4.1 01/24/2020 1037   K 4.0 10/12/2017 1302   CL 104 01/24/2020 1037   CO2 26 01/24/2020 1037   CO2 27 10/12/2017 1302   BUN 13 01/24/2020 1037   BUN 11.4 10/12/2017 1302   CREATININE 0.86 01/24/2020 1037   CREATININE 0.8 10/12/2017 1302      Component Value Date/Time   CALCIUM 9.4 01/24/2020 1037   CALCIUM 9.3 10/12/2017 1302   ALKPHOS 69 01/24/2020 1037   ALKPHOS 66 10/12/2017 1302   AST 18 01/24/2020 1037   AST 17 10/12/2017 1302   ALT 15 01/24/2020 1037   ALT 13 10/12/2017 1302   BILITOT 0.6 01/24/2020 1037   BILITOT 0.59 10/12/2017 1302       No results found for: LABCA2  No components found for: LABCA125  No results for input(s): INR in the last 168 hours.  Urinalysis    Component Value Date/Time   COLORURINE YELLOW 02/14/2013 0703   APPEARANCEUR CLOUDY (A) 02/14/2013 0703  LABSPEC 1.018 02/14/2013 0703   PHURINE 8.0 02/14/2013 0703   GLUCOSEU NEGATIVE 02/14/2013 0703   HGBUR NEGATIVE  02/14/2013 0703   BILIRUBINUR NEGATIVE 02/14/2013 0703   KETONESUR 15 (A) 02/14/2013 0703   PROTEINUR NEGATIVE 02/14/2013 0703   UROBILINOGEN 0.2 02/14/2013 0703   NITRITE NEGATIVE 02/14/2013 0703   LEUKOCYTESUR NEGATIVE 02/14/2013 0703    STUDIES: No results found.   ELIGIBLE FOR AVAILABLE RESEARCH PROTOCOL: no  ASSESSMENT: 69 y.o. [place] woman status post left breast upper outer quadrant biopsy x2 on 08/20/2016 for a clinical mT2 N0, stage IIA invasive ductal carcinoma, both tumors showing identical prognostic panels: Estrogen and progesterone receptor strongly positive, HER-2 negative, with an MIB-1 of 10-15%   (1) neoadjuvant anastrozole started 09/10/2016 in anticipation possible surgical delays  (2) status post left lumpectomy and sentinel lymph node sampling 11/09/2016 for a mpT2 pN0, stage IB (2018 classification) invasive ductal carcinoma, with negative margins.  (3) Oncotype DX score of 25 predicts a risk of outside the breast recurrence of 17% if the patient's only systemic therapy is tamoxifen for 5 years. It predicts a risk reduction of approximately an additional 4% if chemotherapy is added.  (a) the patient opted against adjuvant chemotherapy  (4) adjuvant radiation  12/20/16 - 01/17/17 Site/dose:   Left Breast treated to 40.05 Gy in 15 fx of 2.67 Gy/fx  and a 10 Gy boost in 5 fx of 2 Gy/fx   (5) anastrozole started January 2018, stopped January 2019 with arthralgias/myalgias  (a) bone density 12/08/2016 showed a T score of -0.2 (normal).  (6) tamoxifen started 11/28/2017, discontinued January 2021 secondary to hot flashes  (7) started Aromasin first week in February 2021--will complete 5 years of antiestrogens February 2023   PLAN: Mikia is now a little over 3 years out from definitive surgery for her breast cancer with no evidence of disease recurrence.  This is very favorable.  She is tolerating Aromasin much better than the other antiestrogens.  I have added  up the prior antiestrogen doses and basically she has 3 years under her belt.  If she can take the exemestane for 2 years she would achieve her goal.  I gave her a written prescription so she can shop this around and see if she can get it in a better price than [facility]  I commended her exercise program and her receiving the Covid vaccine successfully.  She is already scheduled for mammography later this month.  I will see her again in a year.  She knows to call for any other issue that may develop before then  Total encounter time 25 minutes.*     , Kathy Dad, MD  01/24/20 11:31 AM Medical Oncology and Hematology [facility] [place], [place] [address] Tel. [phone]    Fax. [phone]   I, Wilburn Mylar, am acting as scribe for Dr. Virgie Dad. .  I, Lurline Del MD, have reviewed the above documentation for accuracy and completeness, and I agree with the above.   *Total Encounter Time as defined by the Centers for Medicare and Medicaid Services includes, in addition to the face-to-face time of a patient visit (documented in the note above) non-face-to-face time: obtaining and reviewing outside history, ordering and reviewing medications, tests or procedures, care coordination (communications with other health care professionals or caregivers) and documentation in the medical record.

## 2020-01-24 ENCOUNTER — Other Ambulatory Visit: Payer: Self-pay

## 2020-01-24 ENCOUNTER — Inpatient Hospital Stay: Payer: PPO | Attending: Oncology | Admitting: Oncology

## 2020-01-24 ENCOUNTER — Inpatient Hospital Stay: Payer: PPO

## 2020-01-24 VITALS — BP 151/97 | HR 75 | Temp 98.4°F | Resp 20 | Ht 63.75 in | Wt 120.5 lb

## 2020-01-24 DIAGNOSIS — Z923 Personal history of irradiation: Secondary | ICD-10-CM | POA: Diagnosis not present

## 2020-01-24 DIAGNOSIS — C50412 Malignant neoplasm of upper-outer quadrant of left female breast: Secondary | ICD-10-CM

## 2020-01-24 DIAGNOSIS — F418 Other specified anxiety disorders: Secondary | ICD-10-CM | POA: Diagnosis not present

## 2020-01-24 DIAGNOSIS — Z170 Estrogen receptor positive status [ER+]: Secondary | ICD-10-CM | POA: Diagnosis not present

## 2020-01-24 DIAGNOSIS — Z79811 Long term (current) use of aromatase inhibitors: Secondary | ICD-10-CM | POA: Diagnosis not present

## 2020-01-24 DIAGNOSIS — Z801 Family history of malignant neoplasm of trachea, bronchus and lung: Secondary | ICD-10-CM | POA: Diagnosis not present

## 2020-01-24 DIAGNOSIS — Z87891 Personal history of nicotine dependence: Secondary | ICD-10-CM | POA: Diagnosis not present

## 2020-01-24 LAB — COMPREHENSIVE METABOLIC PANEL
ALT: 15 U/L (ref 0–44)
AST: 18 U/L (ref 15–41)
Albumin: 4.5 g/dL (ref 3.5–5.0)
Alkaline Phosphatase: 69 U/L (ref 38–126)
Anion gap: 10 (ref 5–15)
BUN: 13 mg/dL (ref 8–23)
CO2: 26 mmol/L (ref 22–32)
Calcium: 9.4 mg/dL (ref 8.9–10.3)
Chloride: 104 mmol/L (ref 98–111)
Creatinine, Ser: 0.86 mg/dL (ref 0.44–1.00)
GFR calc Af Amer: >60 mL/min (ref >60)
GFR calc non Af Amer: >60 mL/min (ref >60)
Glucose, Bld: 118 mg/dL — ABNORMAL HIGH (ref 70–99)
Potassium: 4.1 mmol/L (ref 3.5–5.1)
Sodium: 140 mmol/L (ref 135–145)
Total Bilirubin: 0.6 mg/dL (ref 0.3–1.2)
Total Protein: 7 g/dL (ref 6.5–8.1)

## 2020-01-24 LAB — CBC WITH DIFFERENTIAL/PLATELET
Abs Immature Granulocytes: 0.01 10*3/uL (ref 0.00–0.07)
Basophils Absolute: 0 10*3/uL (ref 0.0–0.1)
Basophils Relative: 1 %
Eosinophils Absolute: 0 10*3/uL (ref 0.0–0.5)
Eosinophils Relative: 1 %
HCT: 39.6 % (ref 36.0–46.0)
Hemoglobin: 13.1 g/dL (ref 12.0–15.0)
Immature Granulocytes: 0 %
Lymphocytes Relative: 24 %
Lymphs Abs: 1 10*3/uL (ref 0.7–4.0)
MCH: 31.8 pg (ref 26.0–34.0)
MCHC: 33.1 g/dL (ref 30.0–36.0)
MCV: 96.1 fL (ref 80.0–100.0)
Monocytes Absolute: 0.3 10*3/uL (ref 0.1–1.0)
Monocytes Relative: 7 %
Neutro Abs: 2.9 10*3/uL (ref 1.7–7.7)
Neutrophils Relative %: 67 %
Platelets: 209 10*3/uL (ref 150–400)
RBC: 4.12 MIL/uL (ref 3.87–5.11)
RDW: 12.1 % (ref 11.5–15.5)
WBC: 4.3 10*3/uL (ref 4.0–10.5)
nRBC: 0 % (ref 0.0–0.2)

## 2020-01-24 MED ORDER — EXEMESTANE 25 MG PO TABS: 25.0000 mg | ORAL_TABLET | Freq: Every day | ORAL | 4 refills | Status: DC

## 2020-01-28 ENCOUNTER — Telehealth: Payer: Self-pay | Admitting: Oncology

## 2020-01-28 NOTE — Telephone Encounter (Signed)
Scheduled appts per 4/15 los. Left voicemail with new appt details. Sent msg to HIM to mail reminder letter and calendar.

## 2020-02-01 ENCOUNTER — Ambulatory Visit: Payer: PPO | Admitting: Physical Therapy

## 2020-02-01 ENCOUNTER — Other Ambulatory Visit: Payer: Self-pay

## 2020-02-01 DIAGNOSIS — M545 Low back pain, unspecified: Secondary | ICD-10-CM

## 2020-02-01 DIAGNOSIS — M62830 Muscle spasm of back: Secondary | ICD-10-CM

## 2020-02-01 DIAGNOSIS — G8929 Other chronic pain: Secondary | ICD-10-CM

## 2020-02-01 NOTE — Therapy (Signed)
[facility] [place], [place], [address] Phone: [phone]   Fax:  [phone]  Physical Therapy Treatment  Patient Details  Name: Kathy Bishop MRN: [id number] Date of Birth: 1951-01-01 Referring Provider (PT): Dr Arnette Norris   Encounter Date: 02/01/2020  PT End of Session - 02/01/20 0956    Visit Number  9    PT Start Time  0930    PT Stop Time  6962    PT Time Calculation (min)  45 min       Past Medical History:  Diagnosis Date  . Anxiety   . Breast cancer ([place])   . Depression   . Dermatitis    allergic  . Foot pain, right   . History of radiation therapy 12/20/16- 01/17/17   Left Breast 40.05 Gy in 15 fractions and a 10 Gy boost in 5 fractions.   . Malignant neoplasm of upper-outer quadrant of left breast in female, estrogen receptor positive ([place]) 08/31/2016  . Need for prophylactic hormone replacement therapy (postmenopausal)   . Nocturia   . Personal history of radiation therapy   . Routine general medical examination at a health care facility   . Symptomatic menopausal or female climacteric states   . Thoracic or lumbosacral neuritis or radiculitis, unspecified     Past Surgical History:  Procedure Laterality Date  . BREAST LUMPECTOMY Left   . BREAST LUMPECTOMY WITH SENTINEL LYMPH NODE BIOPSY Left 11/09/2016   Procedure: LEFT BREAST LUMPECTOMY WITH SENTINEL LYMPH NODE BX;  Surgeon: Coralie Keens, MD;  Location: [facility];  Service: General;  Laterality: Left;  . TONSILLECTOMY     age 69    There were no vitals filed for this visit.  Subjective Assessment - 02/01/20 0925    Subjective  managing- doing HEP and TENS but still pain esp with increased walking and prolonged sitting    Currently in Pain?  Yes    Pain Score  2     Pain Location  Buttocks    Pain Orientation  Left                       [facility] Adult PT Treatment/Exercise - 02/01/20 0001      Moist Heat Therapy   Number Minutes Moist Heat  15 Minutes    Moist Heat Location  Lumbar Spine      Ultrasound   Ultrasound Location  Left SI    Ultrasound Parameters  1.4 w/cm @ 100% cont    Ultrasound Goals  Pain      Manual Therapy   Manual Therapy  Edema management    Soft tissue mobilization  to the left SI area   TP and tenderness noted                 PT Long Term Goals - 02/01/20 0957      PT LONG TERM GOAL #1   Title  I with advanced HEP ( 12/26/2019)    Status  Achieved      PT LONG TERM GOAL #2   Title  demo lumbar ROM full and painfree to allow her to move and play with her grandnephew ( 12/26/2019)    Status  Achieved      PT LONG TERM GOAL #3   Title  [name] FOTO =/< 31% limited ( 12/26/2019)    Status  Partially Met  Plan - 02/01/20 1000    Clinical Impression Statement  pt verb doing HEP and using TENS and managing. tenderness and TP in left SI/glut. goals partially met. pt to see if can maintain at home and will call if not to reschedule    PT Treatment/Interventions  Taping;Patient/family education;Functional mobility training;Moist Heat;Ultrasound;Therapeutic activities;Passive range of motion;Therapeutic exercise;Cryotherapy;Electrical Stimulation;Manual techniques;Dry needling    PT Next Visit Plan  HOLD to assess can maintain at home with TENS and HEP       Patient will benefit from skilled therapeutic intervention in order to improve the following deficits and impairments:  Decreased range of motion, Pain, Increased muscle spasms, Decreased strength  Visit Diagnosis: Muscle spasm of back  Chronic bilateral low back pain without sciatica     Problem List Patient Active Problem List   Diagnosis Date Noted  . Insomnia 11/13/2019  . Back pain 11/13/2019  . Rectal bleeding 08/30/2018  . Unintended weight loss 08/30/2018  . Nausea 08/30/2018  . Rash 04/28/2017  . Malignant neoplasm of upper-outer quadrant of left  breast in female, estrogen receptor positive ([place]) 08/31/2016  . BPV (benign positional vertigo) 03/09/2013  . Fatigue 02/15/2012  . Leukopenia 02/15/2012  . MENOPAUSE-RELATED VASOMOTOR SYMPTOMS, HOT FLASHES 12/26/2009  . BACK PAIN, LUMBAR, WITH RADICULOPATHY 06/17/2009  . NOCTURIA 05/06/2008  . Depression 03/02/2007    Rashaad Hallstrom,ANGIE PTA 02/01/2020, 10:02 AM  [facility] [address] [place], [place], [address] Phone: [phone]   Fax:  [phone]  Name: Kathy Bishop MRN: [id number] Date of Birth: 12-16-1950

## 2020-02-06 ENCOUNTER — Ambulatory Visit
Admission: RE | Admit: 2020-02-06 | Discharge: 2020-02-06 | Disposition: A | Discharge status: Home / Self Care | Payer: PPO | Source: Ambulatory Visit | Attending: Oncology | Admitting: Oncology

## 2020-02-06 ENCOUNTER — Other Ambulatory Visit: Payer: Self-pay

## 2020-02-06 DIAGNOSIS — Z9889 Other specified postprocedural states: Secondary | ICD-10-CM

## 2020-02-06 DIAGNOSIS — Z853 Personal history of malignant neoplasm of breast: Secondary | ICD-10-CM | POA: Diagnosis not present

## 2020-02-06 DIAGNOSIS — R922 Inconclusive mammogram: Secondary | ICD-10-CM | POA: Diagnosis not present

## 2020-02-06 MED ORDER — ESCITALOPRAM OXALATE 20 MG PO TABS: ORAL_TABLET | ORAL | 0 refills | Status: DC

## 2020-06-09 ENCOUNTER — Other Ambulatory Visit: Payer: Self-pay | Admitting: Family Medicine

## 2020-06-14 IMAGING — MG DIGITAL DIAGNOSTIC BILAT W/ TOMO W/ CAD
9 series · 9 of 25 positions shown · non-contrast
Comparison: Previous exam(s).

CLINICAL DATA: History of left breast cancer status post lumpectomy
in 1517.

EXAM:
DIGITAL DIAGNOSTIC BILATERAL MAMMOGRAM WITH CAD AND TOMO

[L MLO]
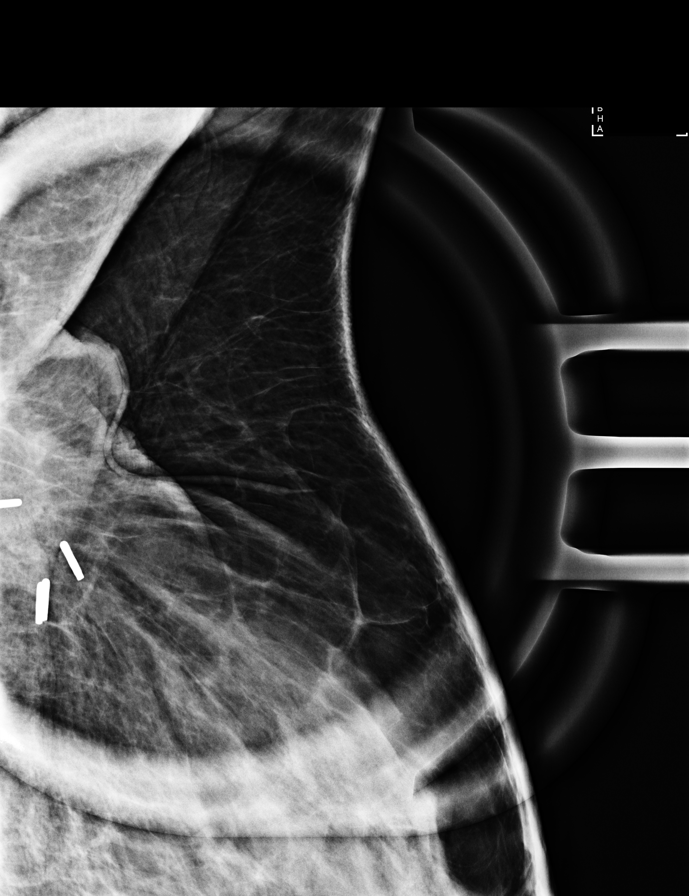

[R MLO synth-2D]
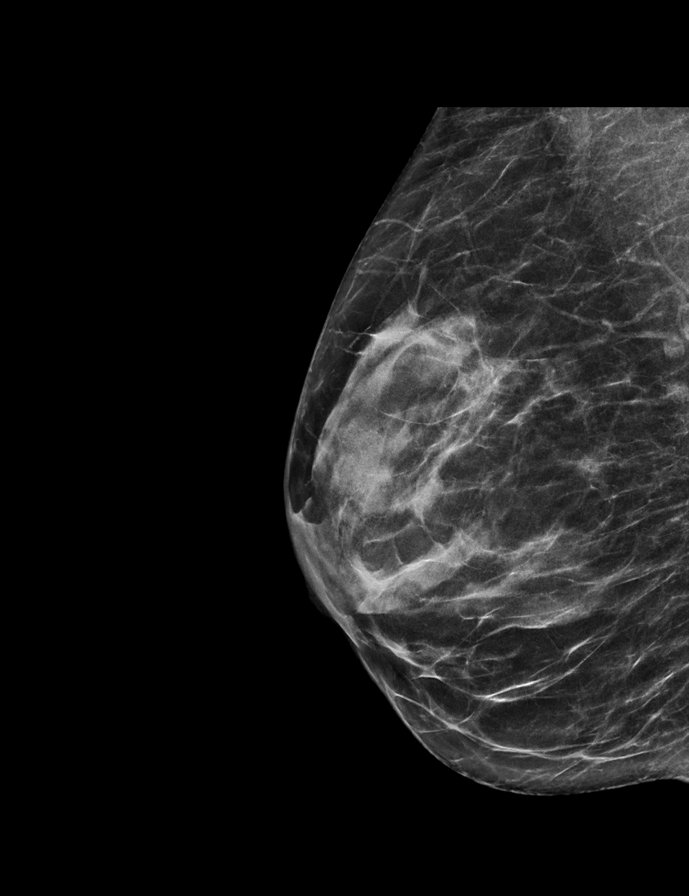

[L CC synth-2D]
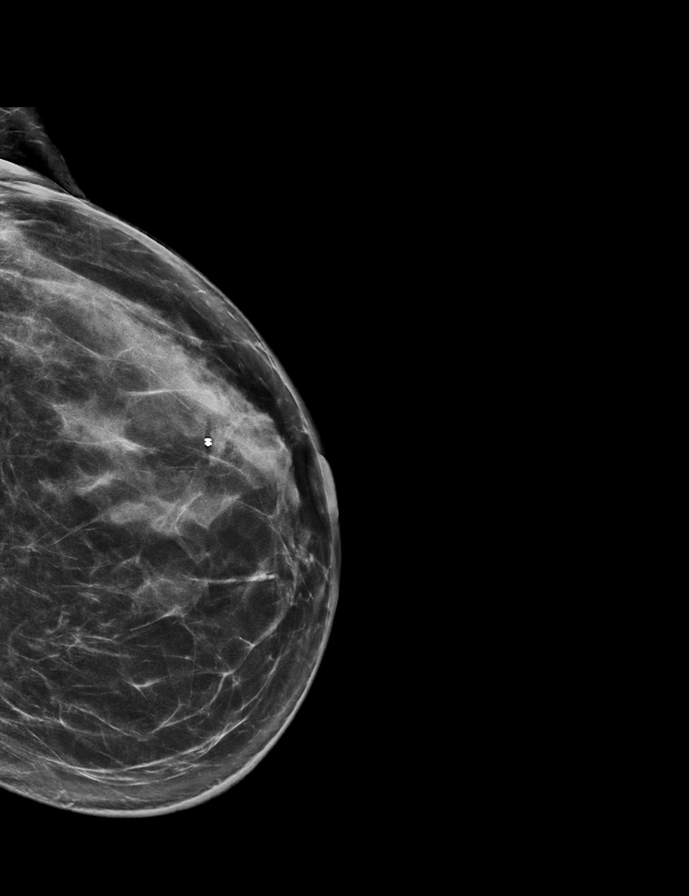

[L MLO synth-2D]
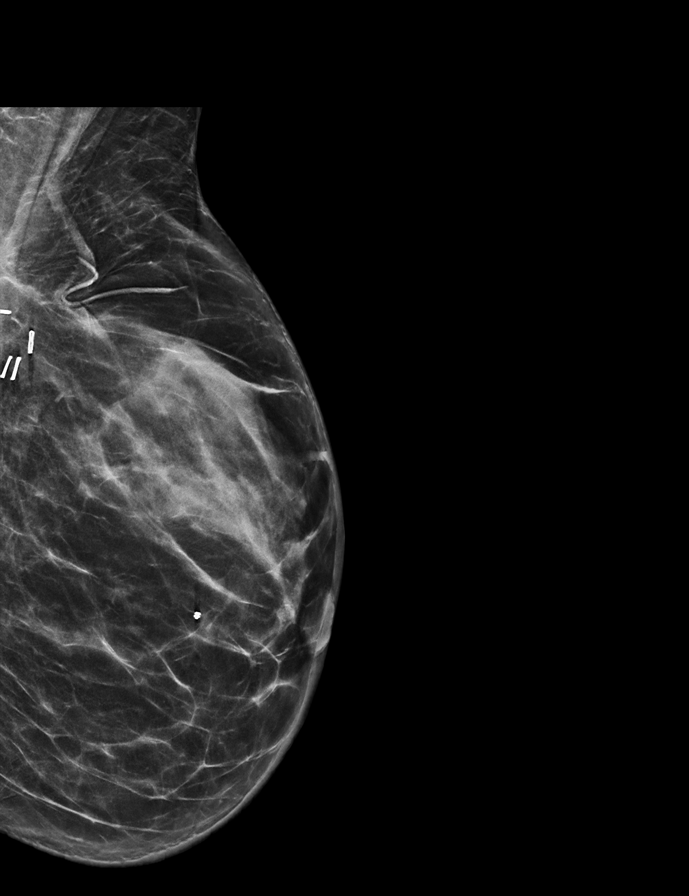

[R CC synth-2D]
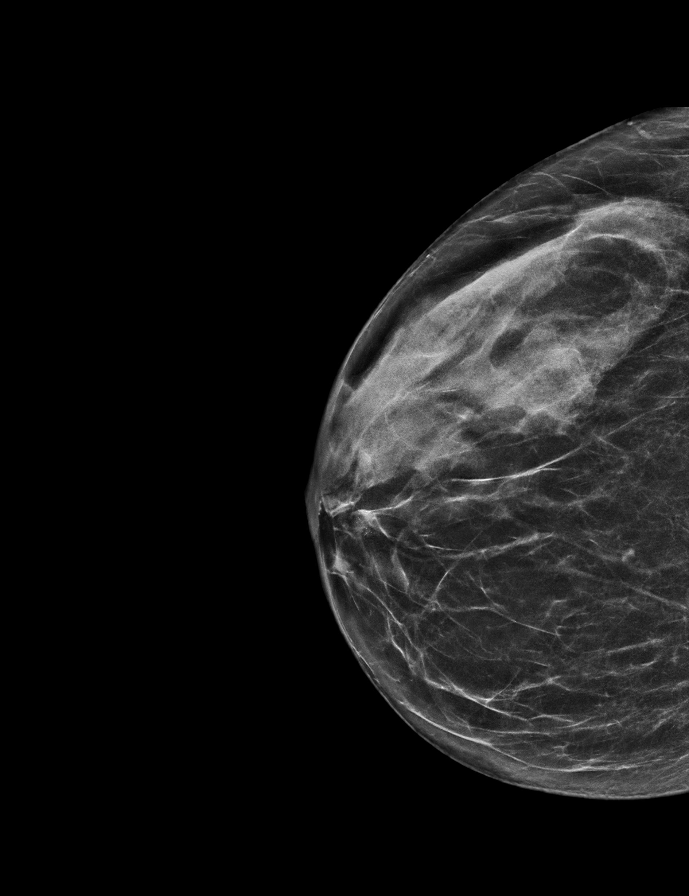

[R MLO tomo · tomo slice 28/55.0]
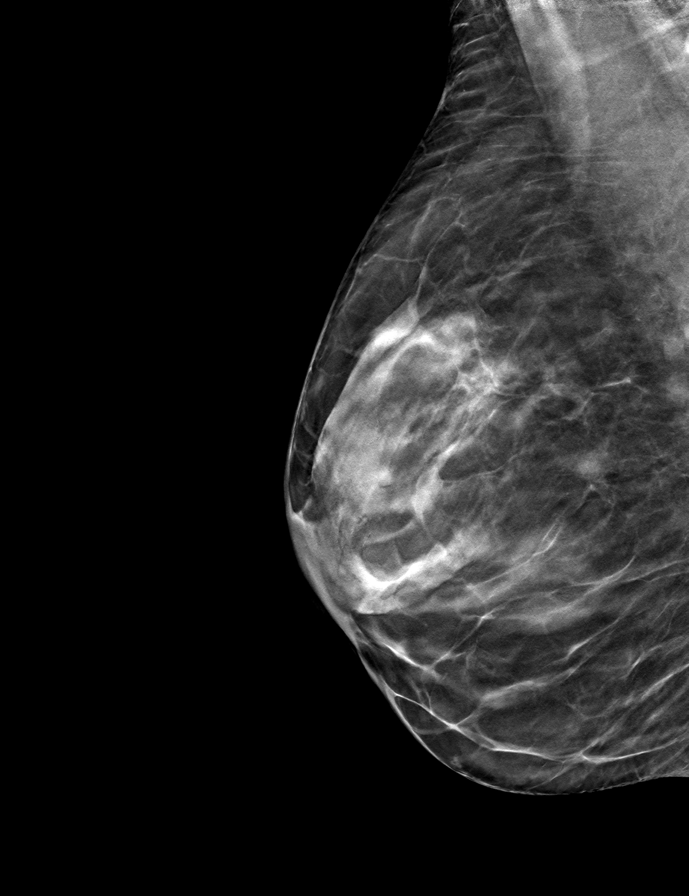

[R CC tomo · tomo slice 27/54.0]
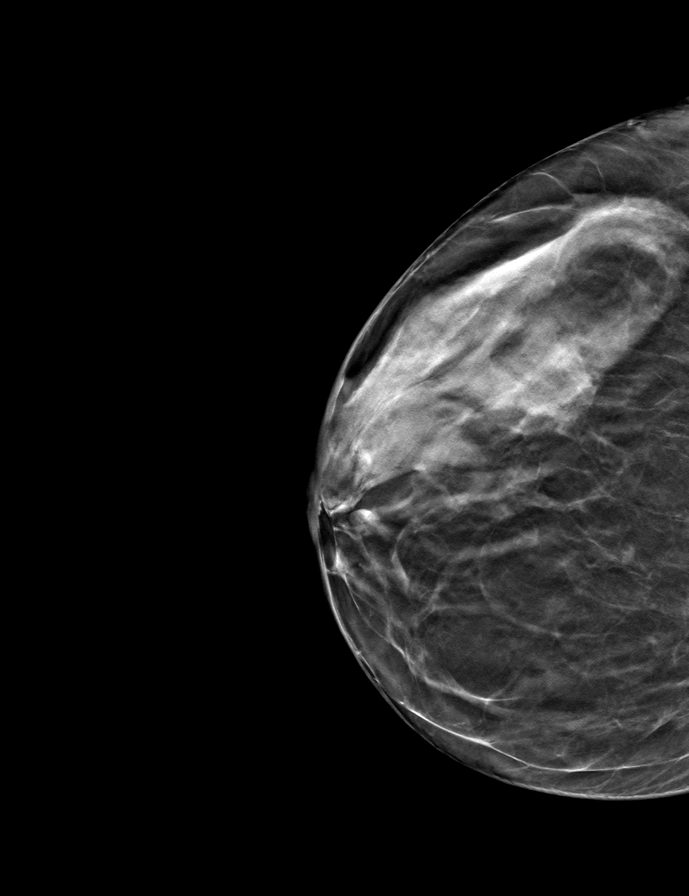

[L CC tomo · tomo slice 35/70.0]
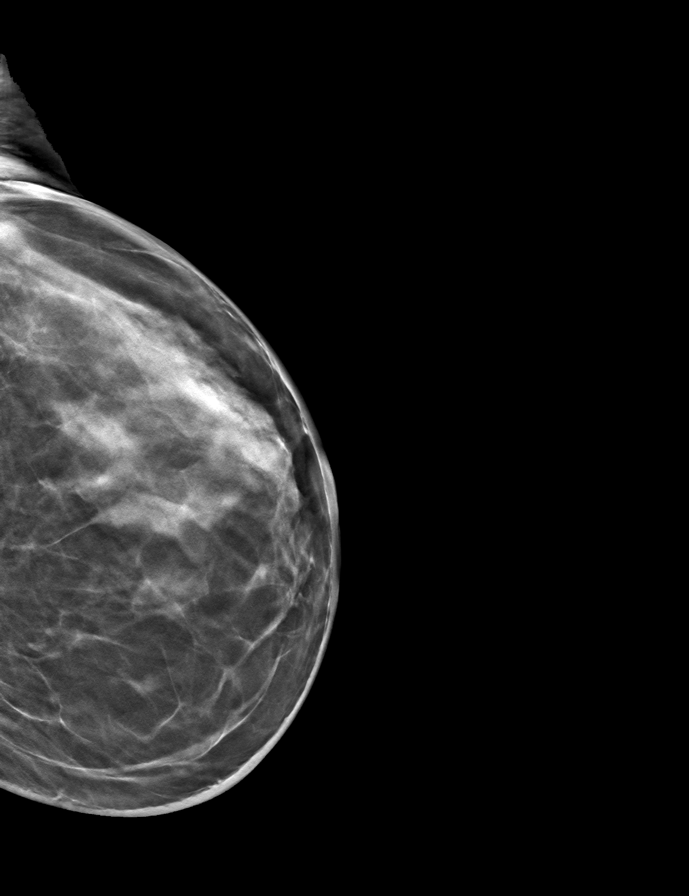

[L MLO tomo · tomo slice 33/66.0]
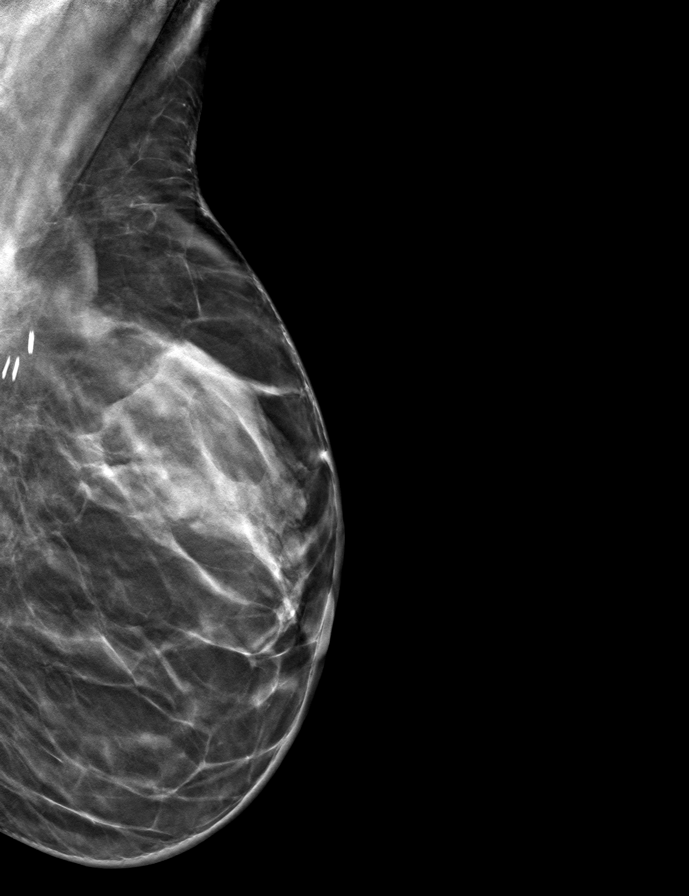

[9 of 25 positions shown; findings below may reference images not displayed]

ACR Breast Density Category c: The breast tissue is heterogeneously
dense, which may obscure small masses.
FINDINGS: Stable lumpectomy changes are seen in the left breast. No suspicious
mass or malignant type microcalcifications identified in either
breast.

Mammographic images were processed with CAD.
IMPRESSION: No evidence of malignancy in either breast.

RECOMMENDATION:
Bilateral diagnostic mammogram in 1 year is recommended.

I have discussed the findings and recommendations with the patient.
If applicable, a reminder letter will be sent to the patient
regarding the next appointment.

BI-RADS CATEGORY  2: Benign.

## 2020-06-24 DIAGNOSIS — J209 Acute bronchitis, unspecified: Secondary | ICD-10-CM | POA: Diagnosis not present

## 2020-07-18 ENCOUNTER — Telehealth: Payer: Self-pay

## 2020-07-18 NOTE — Telephone Encounter (Signed)
Pt doesn't have a transfer of care appt until 09/22/20 but she [facility]? Will we be able to do this for patient or does she need to wait until her appointment before we can refill? She was a Dr. Marjory Lies patient.

## 2020-07-18 NOTE — Telephone Encounter (Signed)
Please have pt schedule acute visit for medication refill

## 2020-07-18 NOTE — Telephone Encounter (Signed)
Pt is scheduled for 07/22/20 @ 2pm

## 2020-07-22 ENCOUNTER — Ambulatory Visit (INDEPENDENT_AMBULATORY_CARE_PROVIDER_SITE_OTHER): Payer: PPO | Admitting: Family Medicine

## 2020-07-22 ENCOUNTER — Other Ambulatory Visit: Payer: Self-pay

## 2020-07-22 VITALS — BP 114/80 | HR 81 | Temp 97.8°F | Ht 64.0 in | Wt 122.0 lb

## 2020-07-22 DIAGNOSIS — R110 Nausea: Secondary | ICD-10-CM | POA: Diagnosis not present

## 2020-07-22 DIAGNOSIS — F325 Major depressive disorder, single episode, in full remission: Secondary | ICD-10-CM | POA: Diagnosis not present

## 2020-07-22 DIAGNOSIS — Z1211 Encounter for screening for malignant neoplasm of colon: Secondary | ICD-10-CM

## 2020-07-22 DIAGNOSIS — R351 Nocturia: Secondary | ICD-10-CM

## 2020-07-22 MED ORDER — ESCITALOPRAM OXALATE 20 MG PO TABS: ORAL_TABLET | ORAL | 1 refills | Status: DC

## 2020-07-22 NOTE — Assessment & Plan Note (Signed)
3-4 times at night, has failed some conservative treatment. Referral to urology

## 2020-07-22 NOTE — Assessment & Plan Note (Signed)
She notes recurrent nausea. Was seen 2 years ago with other symptoms (rectal bleeding, weight loss) and GI referral advised. Never saw GI. Bleeding and weight loss improved but nausea is still intermittent and recurrent. Discussed trial of PPI or H2 blocked but also recommended GI referral given length of symptoms and age.

## 2020-07-22 NOTE — Patient Instructions (Addendum)
Try Omeprazole or Famotidine for nausea  Referral to GI and urology  #Referral I have placed a referral to a specialist for you. You should receive a phone call from the specialty office. Make sure your voicemail is not full and that if you are able to answer your phone to unknown or new numbers.   It may take up to 2 weeks to hear about the referral. If you do not hear anything in 2 weeks, please call our office and ask to speak with the referral coordinator.

## 2020-07-22 NOTE — Assessment & Plan Note (Signed)
Well controlled on lexpro 15 mg daily, though recently ran out of medication. Advised restarting lexapro 10 mg and increase in a few weeks if symptoms not controlled.

## 2020-07-22 NOTE — Progress Notes (Signed)
Subjective:     Kathy Bishop is a 69 y.o. female presenting for Medication Refill     HPI   #depression - ran out of medication - has been out for 2 weeks - would like to restart the medication  #Hx of breast cancer - continues to take exemestane - follows with onocology   #nausea - take phenergan w/ some improvement - started in 2019  - never saw GI - was not bothersome for a while - recently started up - no reflux symptoms - less nauseous during eating but returns w/in 30-60 minutes of eating -   Review of Systems   Social History   Tobacco Use  Smoking Status Former Smoker  Smokeless Tobacco Never Used  Tobacco Comment   smoked years ago in her 20's socially        Objective:    BP Readings from Last 3 Encounters:  07/22/20 114/80  01/24/20 (!) 151/97  10/16/19 134/77   Wt Readings from Last 3 Encounters:  07/22/20 122 lb (55.3 kg)  01/24/20 120 lb 8 oz (54.7 kg)  10/16/19 128 lb 12.8 oz (58.4 kg)    BP 114/80   Pulse 81   Temp 97.8 F (36.6 C) (Temporal)   Ht 5\' 4"  (1.626 m)   Wt 122 lb (55.3 kg)   SpO2 98%   BMI 20.94 kg/m    Physical Exam Constitutional:      General: She is not in acute distress.    Appearance: She is well-developed. She is not diaphoretic.  HENT:     Right Ear: External ear normal.     Left Ear: External ear normal.     Nose: Nose normal.  Eyes:     Conjunctiva/sclera: Conjunctivae normal.  Cardiovascular:     Rate and Rhythm: Normal rate.  Pulmonary:     Effort: Pulmonary effort is normal.  Abdominal:     General: Abdomen is flat. Bowel sounds are normal.     Palpations: Abdomen is soft. There is no mass.     Tenderness: There is no abdominal tenderness. There is no guarding or rebound.  Musculoskeletal:     Cervical back: Neck supple.  Skin:    General: Skin is warm and dry.     Capillary Refill: Capillary refill takes less than 2 seconds.  Neurological:     Mental Status: She is alert. Mental  status is at baseline.  Psychiatric:        Mood and Affect: Mood normal.        Behavior: Behavior normal.           Assessment & Plan:   Problem List Items Addressed This Visit      Other   Depression, major, single episode, complete remission ([place]) - Primary    Well controlled on lexpro 15 mg daily, though recently ran out of medication. Advised restarting lexapro 10 mg and increase in a few weeks if symptoms not controlled.       Relevant Medications   escitalopram (LEXAPRO) 20 MG tablet   NOCTURIA    3-4 times at night, has failed some conservative treatment. Referral to urology      Relevant Orders   Ambulatory referral to Urology   Nausea    She notes recurrent nausea. Was seen 2 years ago with other symptoms (rectal bleeding, weight loss) and GI referral advised. Never saw GI. Bleeding and weight loss improved but nausea is still intermittent and recurrent. Discussed trial of PPI  or H2 blocked but also recommended GI referral given length of symptoms and age.       Relevant Orders   Ambulatory referral to Gastroenterology    Other Visit Diagnoses    Screening for colon cancer       Relevant Orders   Ambulatory referral to Gastroenterology       Return in about 2 months (around 09/21/2020).  Lesleigh Noe, MD  This visit occurred during the SARS-CoV-2 public health emergency.  Safety protocols were in place, including screening questions prior to the visit, additional usage of staff PPE, and extensive cleaning of exam room while observing appropriate contact time as indicated for disinfecting solutions.

## 2020-08-29 ENCOUNTER — Encounter: Payer: Self-pay | Admitting: Gastroenterology

## 2020-09-22 ENCOUNTER — Ambulatory Visit (INDEPENDENT_AMBULATORY_CARE_PROVIDER_SITE_OTHER): Payer: PPO | Admitting: Family Medicine

## 2020-09-22 ENCOUNTER — Other Ambulatory Visit: Payer: Self-pay

## 2020-09-22 ENCOUNTER — Encounter: Payer: Self-pay | Admitting: Family Medicine

## 2020-09-22 DIAGNOSIS — R030 Elevated blood-pressure reading, without diagnosis of hypertension: Secondary | ICD-10-CM | POA: Insufficient documentation

## 2020-09-22 DIAGNOSIS — F325 Major depressive disorder, single episode, in full remission: Secondary | ICD-10-CM

## 2020-09-22 MED ORDER — ESCITALOPRAM OXALATE 20 MG PO TABS: 20.0000 mg | ORAL_TABLET | Freq: Every day | ORAL | 1 refills | Status: DC

## 2020-09-22 NOTE — Progress Notes (Signed)
Subjective:     Kathy Bishop is a 69 y.o. female presenting for Establish Care (No concerns )     HPI   #HTN - was high at the dentists in November - no cp, ha, vision changes - does not check at home  #Depression - medication error - has been taking 30 mg of lexapro - endorse she continues to feel anxious - has noticed the anxiety being worse over the last several months - no known trigger - will wake up feeling a little more anxious - has not looked into therapy - endorses some heart racing at night - rare  Previously tried prozac which was not good  #GI - has referral coming up  Review of Systems   Social History   Tobacco Use  Smoking Status Former Smoker  Smokeless Tobacco Never Used  Tobacco Comment   smoked years ago in her 20's socially        Objective:    BP Readings from Last 3 Encounters:  09/22/20 (!) 140/98  07/22/20 114/80  01/24/20 (!) 151/97   Wt Readings from Last 3 Encounters:  09/22/20 123 lb (55.8 kg)  07/22/20 122 lb (55.3 kg)  01/24/20 120 lb 8 oz (54.7 kg)    BP (!) 140/98   Pulse 71   Temp (!) 96.9 F (36.1 C) (Temporal)   Ht 5\' 4"  (1.626 m)   Wt 123 lb (55.8 kg)   SpO2 98%   BMI 21.11 kg/m    Physical Exam Constitutional:      General: She is not in acute distress.    Appearance: She is well-developed. She is not diaphoretic.  HENT:     Right Ear: External ear normal.     Left Ear: External ear normal.  Eyes:     Conjunctiva/sclera: Conjunctivae normal.  Cardiovascular:     Rate and Rhythm: Normal rate and regular rhythm.     Heart sounds: No murmur heard.   Pulmonary:     Effort: Pulmonary effort is normal. No respiratory distress.     Breath sounds: Normal breath sounds. No wheezing.  Musculoskeletal:     Cervical back: Neck supple.  Skin:    General: Skin is warm and dry.     Capillary Refill: Capillary refill takes less than 2 seconds.  Neurological:     Mental Status: She is alert. Mental  status is at baseline.  Psychiatric:        Mood and Affect: Mood normal.        Behavior: Behavior normal.           Assessment & Plan:   Problem List Items Addressed This Visit      Other   Depression, major, single episode, complete remission ([place])    Advised decreasing lexapro to 20 mg daily. Suspect the higher dose may have triggered anxiety. Hand out for anxiety. If worsening - can consider switching to zoloft or effexor as already tried prozac. She will Mychart. If depression/anxiety well controlled on 20 mg will also try to decrease to 10 mg lexapro.       Relevant Medications   escitalopram (LEXAPRO) 20 MG tablet   Elevated blood pressure reading    Home monitoring. Suspect she does have HTN, but will do home monitoring and start medications at next visit is continuing to be elevated.           Return in about 3 months (around 12/21/2020) for medicare wellness - BP follow-up.  Janett Billow  Karie Soda, MD  This visit occurred during the SARS-CoV-2 public health emergency.  Safety protocols were in place, including screening questions prior to the visit, additional usage of staff PPE, and extensive cleaning of exam room while observing appropriate contact time as indicated for disinfecting solutions.

## 2020-09-22 NOTE — Assessment & Plan Note (Signed)
Home monitoring. Suspect she does have HTN, but will do home monitoring and start medications at next visit is continuing to be elevated.

## 2020-09-22 NOTE — Assessment & Plan Note (Signed)
Advised decreasing lexapro to 20 mg daily. Suspect the higher dose may have triggered anxiety. Hand out for anxiety. If worsening - can consider switching to zoloft or effexor as already tried prozac. She will Mychart. If depression/anxiety well controlled on 20 mg will also try to decrease to 10 mg lexapro.

## 2020-09-22 NOTE — Patient Instructions (Addendum)
Your blood pressure high.   High blood pressure increases your risk for heart attack and stroke.    Please check your blood pressure 2-4 times a week.   To check your blood pressure 1) Sit in a quiet and relaxed place for 5 minutes 2) Make sure your feet are flat on the ground 3) Consider checking first thing in the morning   Normal blood pressure is less than 140/90 Ideally you blood pressure should be around 120/80   Depression - take 20 mg of lexapro - if doing well after 2 weeks (no depression symptoms, no change in anxiety) - can try decreasing to 10 mg (1/2 tablet).  - Send MyChart message in 2-4 weeks if anxiety not better and you want to consider a medication change   Could consider switching medication to either Zoloft or Effexor   How to help anxiety - without medication.   1) Regular Exercise - walking, jogging, cycling, dancing, strength training --> Yoga has been shown in research to reduce depression and anxiety -- with even just one hour long session per week  2)  Begin a Mindfulness/Meditation practice -- this can take a little as 3 minutes and is helpful for all kinds of mood issues -- You can find resources in books -- Or you can download apps like  ---- Headspace App (which currently has free content called "Weathering the Storm") ---- Calm (which has a few free options)  ---- Insignt Timer ---- Stop, Breathe & Think  # With each of these Apps - you should decline the "start free trial" offer and as you search through the App should be able to access some of their free content. You can also chose to pay for the content if you find one that works well for you.   # Many of them also offer sleep specific content which may help with insomnia  3) Healthy Diet -- Avoid or decrease Caffeine -- Avoid or decrease Alcohol -- Drink plenty of water, have a balanced diet -- Avoid cigarettes and marijuana (as well as other recreational drugs)  4) Consider  contacting a professional therapist  -- [employer] is one option. Call [phone] -- Or you can check out www.psychologytoday.com -- you can read bios of therapists and see if they accept insurance -- Check with your insurance to see if you have coverage and who may take your insurance

## 2020-10-15 ENCOUNTER — Encounter: Payer: Self-pay | Admitting: Gastroenterology

## 2020-10-15 ENCOUNTER — Ambulatory Visit: Payer: PPO | Admitting: Gastroenterology

## 2020-10-15 VITALS — BP 122/74 | HR 62 | Ht 63.75 in | Wt 126.0 lb

## 2020-10-15 DIAGNOSIS — Z1211 Encounter for screening for malignant neoplasm of colon: Secondary | ICD-10-CM | POA: Diagnosis not present

## 2020-10-15 DIAGNOSIS — R110 Nausea: Secondary | ICD-10-CM | POA: Diagnosis not present

## 2020-10-15 MED ORDER — OMEPRAZOLE 20 MG PO CPDR
DELAYED_RELEASE_CAPSULE | ORAL | 3 refills | Status: DC
Start: 2020-10-15 — End: 2021-01-08

## 2020-10-15 MED ORDER — SUPREP BOWEL PREP KIT 17.5-3.13-1.6 GM/177ML PO SOLN
1.0000 | ORAL | 0 refills | Status: DC
Start: 2020-10-15 — End: 2020-11-28

## 2020-10-15 NOTE — Progress Notes (Signed)
HPI: This is a very pleasant 70 year old woman who was referred to me by Lynnda Child, MD  to evaluate chronic nausea, routine risk for colon cancer  Progress complaint is that of chronic nausea.  She wakes up nauseous and she is nauseous through most of the day.  She does have Phenergan which she has been taking on as-needed basis at night which does seem to help but it makes her quite sleepy.  Her nausea has been going on for at least 2 years.  She never has vomiting.  She does not have any significant abdominal pains.  She does take either Aleve or an Advil about 3 times per week, sometimes she will take 2 pills at once.  She intermittently has heartburn, does not take anything for it.  She has no trouble swallowing.  Up until 4 to 5 months ago she was having alternating constipation and loose stools and very minor intermittent bright red blood per rectum.  The symptoms have all completely resolved and she is moving her bowels easily, normally once daily lately.  She has not had colon cancer screening ever, she did have a colonoscopy about 40 or 50 years ago.  Colon cancer does not run in her family  #2 most common side effect of Lexapro is nausea   Old Data Reviewed: Blood work April 2021 normal CBC and normal complete metabolic profile  Review of systems: Pertinent positive and negative review of systems were noted in the above HPI section. All other review negative.   Past Medical History:  Diagnosis Date  . Anxiety   . Breast cancer (HCC)   . Depression   . Dermatitis    allergic  . Foot pain, right   . History of radiation therapy 12/20/16- 01/17/17   Left Breast 40.05 Gy in 15 fractions and a 10 Gy boost in 5 fractions.   . Malignant neoplasm of upper-outer quadrant of left breast in female, estrogen receptor positive (HCC) 08/31/2016  . Need for prophylactic hormone replacement therapy (postmenopausal)   . Nocturia   . Personal history of radiation therapy   . Routine  general medical examination at a health care facility   . Symptomatic menopausal or female climacteric states   . Thoracic or lumbosacral neuritis or radiculitis, unspecified     Past Surgical History:  Procedure Laterality Date  . BREAST LUMPECTOMY Left   . BREAST LUMPECTOMY WITH SENTINEL LYMPH NODE BIOPSY Left 11/09/2016   Procedure: LEFT BREAST LUMPECTOMY WITH SENTINEL LYMPH NODE BX;  Surgeon: Abigail Miyamoto, MD;  Location: [facility];  Service: General;  Laterality: Left;  . TONSILLECTOMY     age 70    Current Outpatient Medications  Medication Sig Dispense Refill  . escitalopram (LEXAPRO) 20 MG tablet Take 1 tablet (20 mg total) by mouth daily. 90 tablet 1  . exemestane (AROMASIN) 25 MG tablet Take 1 tablet (25 mg total) by mouth daily after breakfast. 90 tablet 4  . promethazine (PHENERGAN) 25 MG tablet Take 1 tablet (25 mg total) by mouth every 8 (eight) hours as needed for nausea. 60 tablet 2   No current facility-administered medications for this visit.    Allergies as of 10/15/2020  . (No Known Allergies)    Family History  Problem Relation Age of Onset  . Alzheimer's disease Mother   . Lung cancer Father        smoker    Social History   Socioeconomic History  . Marital status: Married  Spouse name: Not on file  . Number of children: 0  . Years of education: Not on file  . 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 Smoking status: Former Research scientist (life sciences)  . Smokeless tobacco: Never Used  . Tobacco comment: smoked years ago in her 41's socially  Substance and Sexual Activity  . Alcohol use: Yes    Comment: social  . Drug use: No  . Sexual activity: Not on file  Other Topics Concern  . Not on file  Social History Narrative  . Not on file   Social Determinants of Health   Financial Resource Strain: Not on file  Food Insecurity:  Not on file  Transportation Needs: Not on file  Physical Activity: Not on file  Stress: Not on file  Social Connections: Not on file  Intimate Partner Violence: Not on file     Physical Exam: BP 122/74   Pulse 62   Ht 5' 3.75" (1.619 m)   Wt 126 lb (57.2 kg)   BMI 21.80 kg/m  Constitutional: generally well-appearing Psychiatric: alert and oriented x3 Eyes: extraocular movements intact Mouth: oral pharynx moist, no lesions Neck: supple no lymphadenopathy Cardiovascular: heart regular rate and rhythm Lungs: clear to auscultation bilaterally Abdomen: soft, nontender, nondistended, no obvious ascites, no peritoneal signs, normal bowel sounds Extremities: no lower extremity edema bilaterally Skin: no lesions on visible extremities   Assessment and plan: 70 y.o. female with chronic nausea, mild remittent GERD, routine risk for colon cancer  First her lower GI symptoms have all improved and she has been having no troubles with constipation, diarrhea or bleeding at least for the past 4 to 5 months.  She has not had colon cancer screening really ever and so I recommended a colonoscopy at her soonest convenience.  She does have mild intermittent GERD without alarm symptoms and rather chronic daily nausea without vomiting.  She is not losing weight.  She does take NSAIDs 2 or 3 times per week and so perhaps that is causing some gastric irritation and nausea.  The #2 most common side effect of Lexapro is nausea and so certainly that could be playing a role here as well.  I recommended that we proceed with EGD at the same time as her colonoscopy to exclude gastric ulcers, gastritis, H. pylori infection.  In the meantime since she has mild intermittent pyrosis without alarm symptoms I recommended that she start taking a single omeprazole, 20 mg daily for breakfast every morning.  It would certainly be interesting to see how her nausea response to this when she returns for her procedures in the  next few weeks.   Please see the "Patient Instructions" section for addition details about the plan.   Owens Loffler, MD [facility] 10/15/2020, 10:33 AM  Cc: Lesleigh Noe, MD  Total time on date of encounter was 45  minutes (this included time spent preparing to see the patient reviewing records; obtaining and/or reviewing separately obtained history; performing a medically appropriate exam and/or evaluation; counseling and educating the patient and family if present; ordering medications, tests or procedures if applicable; and documenting clinical information in the health record).

## 2020-10-15 NOTE — Patient Instructions (Signed)
If you are age 69 or older, your body mass index should be between 23-30. Your Body mass index is 21.8 kg/m. If this is out of the aforementioned range listed, please consider follow up with your Primary Care Provider.  If you are age 69 or younger, your body mass index should be between 19-25. Your Body mass index is 21.8 kg/m. If this is out of the aformentioned range listed, please consider follow up with your Primary Care Provider.   You have been scheduled for an endoscopy and colonoscopy. Please follow the written instructions given to you at your visit today. Please pick up your prep supplies at the pharmacy within the next 1-3 days. If you use inhalers (even only as needed), please bring them with you on the day of your procedure.  Due to recent changes in healthcare laws, you may see the results of your imaging and laboratory studies on MyChart before your provider has had a chance to review them.  We understand that in some cases there may be results that are confusing or concerning to you. Not all laboratory results come back in the same time frame and the provider may be waiting for multiple results in order to interpret others.  Please give [place] 48 hours in order for your provider to thoroughly review all the results before contacting the office for clarification of your results.   We have sent the following medications to your pharmacy for you to pick up at your convenience:  START: omeprazole 20mg  take one capsule shortly before breakfast meal each day.  Thank you for entrusting me with your care and choosing [facility].  Dr [facility]

## 2020-11-28 ENCOUNTER — Encounter: Payer: Self-pay | Admitting: Gastroenterology

## 2020-11-28 ENCOUNTER — Ambulatory Visit (AMBULATORY_SURGERY_CENTER): Payer: PPO | Admitting: Gastroenterology

## 2020-11-28 ENCOUNTER — Other Ambulatory Visit: Payer: Self-pay

## 2020-11-28 VITALS — BP 169/62 | HR 61 | Temp 97.3°F | Resp 21 | Ht 63.0 in | Wt 126.0 lb

## 2020-11-28 DIAGNOSIS — R110 Nausea: Secondary | ICD-10-CM | POA: Diagnosis not present

## 2020-11-28 DIAGNOSIS — Z1211 Encounter for screening for malignant neoplasm of colon: Secondary | ICD-10-CM

## 2020-11-28 DIAGNOSIS — K219 Gastro-esophageal reflux disease without esophagitis: Secondary | ICD-10-CM | POA: Diagnosis not present

## 2020-11-28 MED ORDER — SODIUM CHLORIDE 0.9 % IV SOLN: 500.0000 mL | Freq: Once | INTRAVENOUS | Status: DC

## 2020-11-28 NOTE — Op Note (Signed)
[place] Patient Name: Kathy Bishop Procedure Date: 11/28/2020 1:43 PM MRN: [id number] Endoscopist: Milus Banister , MD Age: 70 Referring MD:  Date of Birth: 1951-04-08 Gender: Female Account #: [id number] Procedure:                Colonoscopy Indications:              Screening for colorectal malignant neoplasm Medicines:                Monitored Anesthesia Care Procedure:                Pre-Anesthesia Assessment:                           - Prior to the procedure, a History and Physical                            was performed, and patient medications and                            allergies were reviewed. The patient's tolerance of                            previous anesthesia was also reviewed. The risks                            and benefits of the procedure and the sedation                            options and risks were discussed with the patient.                            All questions were answered, and informed consent                            was obtained. Prior Anticoagulants: The patient has                            taken no previous anticoagulant or antiplatelet                            agents. ASA Grade Assessment: II - A patient with                            mild systemic disease. After reviewing the risks                            and benefits, the patient was deemed in                            satisfactory condition to undergo the procedure.                           After obtaining informed consent, the colonoscope  was passed under direct vision. Throughout the                            procedure, the patient's blood pressure, pulse, and                            oxygen saturations were monitored continuously. The                            Olympus CF-HQ190L [id number]) Colonoscope was                            introduced through the anus and advanced to the the                            cecum, identified by  appendiceal orifice and                            ileocecal valve. The colonoscopy was performed                            without difficulty. The patient tolerated the                            procedure well. The quality of the bowel                            preparation was good. The ileocecal valve,                            appendiceal orifice, and rectum were photographed. Scope In: 1:46:58 PM Scope Out: 2:00:34 PM Scope Withdrawal Time: 0 hours 6 minutes 27 seconds  Total Procedure Duration: 0 hours 13 minutes 36 seconds  Findings:                 Internal hemorrhoids were found. The hemorrhoids                            were small.                           The exam was otherwise without abnormality on                            direct and retroflexion views. Complications:            No immediate complications. Estimated blood loss:                            None. Estimated Blood Loss:     Estimated blood loss: none. Impression:               - Internal hemorrhoids.                           - The examination was otherwise normal on direct  and retroflexion views.                           - No polyps or cancers. Recommendation:           - Repeat colonoscopy in 10 years for screening                            purposes.                           - EGD now. Milus Banister, MD 11/28/2020 2:02:14 PM This report has been signed electronically.

## 2020-11-28 NOTE — Progress Notes (Signed)
Report to PACU, RN, vss, BBS= Clear.  

## 2020-11-28 NOTE — Progress Notes (Addendum)
Pt's states no medical or surgical changes since previsit or office visit.  VS CW  Pt states her BP has been elevated for a few months and her primary MD is monitoring it.

## 2020-11-28 NOTE — Op Note (Signed)
[place] Patient Name: Kathy Bishop Procedure Date: 11/28/2020 1:42 PM MRN: [id number] Endoscopist: Milus Banister , MD Age: 70 Referring MD:  Date of Birth: Jun 22, 1951 Gender: Female Account #: [id number] Procedure:                Upper GI endoscopy Indications:              Nausea Medicines:                Monitored Anesthesia Care Procedure:                Pre-Anesthesia Assessment:                           - Prior to the procedure, a History and Physical                            was performed, and patient medications and                            allergies were reviewed. The patient's tolerance of                            previous anesthesia was also reviewed. The risks                            and benefits of the procedure and the sedation                            options and risks were discussed with the patient.                            All questions were answered, and informed consent                            was obtained. Prior Anticoagulants: The patient has                            taken no previous anticoagulant or antiplatelet                            agents. ASA Grade Assessment: II - A patient with                            mild systemic disease. After reviewing the risks                            and benefits, the patient was deemed in                            satisfactory condition to undergo the procedure.                           After obtaining informed consent, the endoscope was  passed under direct vision. Throughout the                            procedure, the patient's blood pressure, pulse, and                            oxygen saturations were monitored continuously. The                            Endoscope was introduced through the mouth, and                            advanced to the second part of duodenum. The upper                            GI endoscopy was accomplished without difficulty.                             The patient tolerated the procedure well. Scope In: Scope Out: Findings:                 The esophagus was normal.                           The stomach was normal.                           The examined duodenum was normal. Complications:            No immediate complications. Estimated blood loss:                            None. Estimated Blood Loss:     Estimated blood loss: none. Impression:               - Normal UGI tract. Recommendation:           - Patient has a contact number available for                            emergencies. The signs and symptoms of potential                            delayed complications were discussed with the                            patient. Return to normal activities tomorrow.                            Written discharge instructions were provided to the                            patient.                           - Resume previous diet.                           -  Continue present medications. Omeprazole at 20mg                             strength once daily seems to have really helped                            your symptoms. You should stay on it. Milus Banister, MD 11/28/2020 2:07:34 PM This report has been signed electronically.

## 2020-11-28 NOTE — Patient Instructions (Signed)
Resume pevious diet  continue current medications stay on omeprazole at 20mg  once daily  YOU HAD AN ENDOSCOPIC PROCEDURE TODAY AT [facility]:   Refer to the procedure report that was given to you for any specific questions about what was found during the examination.  If the procedure report does not answer your questions, please call your gastroenterologist to clarify.  If you requested that your care partner not be given the details of your procedure findings, then the procedure report has been included in a sealed envelope for you to review at your convenience later.  YOU SHOULD EXPECT: Some feelings of bloating in the abdomen. Passage of more gas than usual.  Walking can help get rid of the air that was put into your GI tract during the procedure and reduce the bloating. If you had a lower endoscopy (such as a colonoscopy or flexible sigmoidoscopy) you may notice spotting of blood in your stool or on the toilet paper. If you underwent a bowel prep for your procedure, you may not have a normal bowel movement for a few days.  Please Note:  You might notice some irritation and congestion in your nose or some drainage.  This is from the oxygen used during your procedure.  There is no need for concern and it should clear up in a day or so.  SYMPTOMS TO REPORT IMMEDIATELY:   Following lower endoscopy (colonoscopy or flexible sigmoidoscopy):  Excessive amounts of blood in the stool  Significant tenderness or worsening of abdominal pains  Swelling of the abdomen that is new, acute  Fever of 100F or higher   Following upper endoscopy (EGD)  Vomiting of blood or coffee ground material  New chest pain or pain under the shoulder blades  Painful or persistently difficult swallowing  New shortness of breath  Fever of 100F or higher  Black, tarry-looking stools  For urgent or emergent issues, a gastroenterologist can be reached at any hour by calling [phone]. Do not use  MyChart messaging for urgent concerns.   DIET:  We do recommend a small meal at first, but then you may proceed to your regular diet.  Drink plenty of fluids but you should avoid alcoholic beverages for 24 hours.  ACTIVITY:  You should plan to take it easy for the rest of today and you should NOT DRIVE or use heavy machinery until tomorrow (because of the sedation medicines used during the test).    FOLLOW UP: Our staff will call the number listed on your records 48-72 hours following your procedure to check on you and address any questions or concerns that you may have regarding the information given to you following your procedure. If we do not reach you, we will leave a message.  We will attempt to reach you two times.  During this call, we will ask if you have developed any symptoms of COVID 19. If you develop any symptoms (ie: fever, flu-like symptoms, shortness of breath, cough etc.) before then, please call [phone].  If you test positive for Covid 19 in the 2 weeks post procedure, please call and report this information to [place].    If any biopsies were taken you will be contacted by phone or by letter within the next 1-3 weeks.  Please call us at [phone] if you have not heard about the biopsies in 3 weeks.   SIGNATURES/CONFIDENTIALITY: You and/or your care partner have signed paperwork which will be entered into your electronic medical record.  These signatures attest to the fact that that the information above on your After Visit Summary has been reviewed and is understood.  Full responsibility of the confidentiality of this discharge information lies with you and/or your care-partner.

## 2020-12-02 ENCOUNTER — Telehealth: Payer: Self-pay | Admitting: *Deleted

## 2020-12-02 ENCOUNTER — Telehealth: Payer: Self-pay

## 2020-12-02 NOTE — Telephone Encounter (Signed)
Left message on follow up call. 

## 2020-12-02 NOTE — Telephone Encounter (Signed)
  Follow up Call-  Call back number 11/28/2020  Post procedure Call Back phone  # [phone]  Permission to leave phone message Yes  Some recent data might be hidden    LMOM to call back with any questions or concerns.  Also, call back if patient has developed fever, respiratory issues or been dx with COVID or had any family members or close contacts diagnosed since her procedure.

## 2021-01-08 ENCOUNTER — Other Ambulatory Visit: Payer: Self-pay | Admitting: Gastroenterology

## 2021-01-28 ENCOUNTER — Other Ambulatory Visit: Payer: Self-pay | Admitting: Oncology

## 2021-01-28 DIAGNOSIS — Z9889 Other specified postprocedural states: Secondary | ICD-10-CM

## 2021-02-04 ENCOUNTER — Other Ambulatory Visit: Payer: Self-pay | Admitting: Family Medicine

## 2021-02-04 DIAGNOSIS — F325 Major depressive disorder, single episode, in full remission: Secondary | ICD-10-CM

## 2021-02-06 ENCOUNTER — Other Ambulatory Visit: Payer: Self-pay | Admitting: *Deleted

## 2021-02-06 DIAGNOSIS — Z170 Estrogen receptor positive status [ER+]: Secondary | ICD-10-CM

## 2021-02-08 NOTE — Progress Notes (Signed)
ID: Kathy Bishop DOB: [birth year]  MR#: [id number]  ZES#:[id number]  Patient Care Team: Kathy Noe, MD as PCP - General (Family Medicine) Kathy Keens, MD as Consulting Physician (General Surgery) Kathy Bishop, Kathy Dad, MD as Consulting Physician (Oncology) Kathy Gibson, MD as Attending Physician (Radiation Oncology) Kathy Bison Charlestine Massed, NP as Nurse Practitioner (Hematology and Oncology) OTHER MD:  CHIEF COMPLAINT: Estrogen receptor positive breast cancer  CURRENT TREATMENT: aromasin   INTERVAL HISTORY: Kathy Bishop returns today for follow-up for her estrogen receptor positive breast cancer.   At her last visit on 10/16/2019, she was started on Aromasin.  She tolerates that much better than the prior antiestrogens and basically has no significant hot flashes and no arthralgias or myalgias.  The only find the ointment is that it is $90 per month.  If she buys 3 months at a time it is $180.  This is still a significant expense but she tells me she is willing to pay it because of the great improvement in the side effect profile.  Since her last visit, she underwent bilateral diagnostic mammography with tomography at [facility] on 02/06/2020 showing: breast density category C; no evidence of malignancy in either breast.   She is scheduled for annual mammography on 03/02/2021.    REVIEW OF SYSTEMS: Kathy Bishop walks about 6 days a week, usually 5 or 6 miles at a time.  She does have vaginal dryness issues.  She does not have any significant discomfort in the breast itself.  Detailed review of systems today was otherwise stable   COVID 19 VACCINATION STATUS: fully vaccinated [employer]), with booster 07/2020   BREAST CANCER HISTORY: From the original intake note:  Kathy Bishop (pronounced "le-veen") reports noticing a mass in her left breast for about a year but did not think to bring this to medical attention, thinking it might be a cyst. The mass was picked up on routine physical exam  by Dr. Deborra Bishop who set up the patient for bilateral screening mammography at the [facility] 08/10/2016. This showed the breast density to be category C. A possible mass was noted in the left breast and on 08/18/2016 she was recalled for left diagnostic mammography with tomography and left breast ultrasonography. This confirmed a 2.9 cm spiculated mass in the upper outer quadrant of the left breast. This was palpable at the 1:00 position 8 cm from the nipple. Ultrasonography found an irregular hypoechoic mass at this site measuring 3.5 cm. The left axilla was sonographically benign.  The patient was then set up for ultrasound-guided biopsy of the left breast mass 08/20/2016. At the time of this ultrasound multiple solid subcentimeter satellite nodules were noted in the upper-outer quadrant of the left breast and one of these was biopsied as well as the main mass. The distance between the clips is 5.0 cm. 4 The pathology from that procedure ([id number]) both showed invasive ductal carcinoma, grade 2, with similar prognostic panels. Specifically the estrogen receptor was 95% and the progesterone receptor 100% for both biopsies, both with strong staining intensity. The MIB-1 was between 10 and 15%. Both tumors were HER-2 negative, with a signals ratio between 0.78 and 1.06 and the number per cell between 1.45 and 1.80  The patient's subsequent history is as detailed below   PAST MEDICAL HISTORY: Past Medical History:  Diagnosis Date  . Anxiety   . Breast cancer (Kathy Bishop)   . Depression   . Dermatitis    allergic  . Foot pain, right   . History  of radiation therapy 12/20/16- 01/17/17   Left Breast 40.05 Gy in 15 fractions and a 10 Gy boost in 5 fractions.   . Malignant neoplasm of upper-outer quadrant of left breast in female, estrogen receptor positive (Kathy Bishop) 08/31/2016  . Need for prophylactic hormone replacement therapy (postmenopausal)   . Neuromuscular disorder ([place])   . Nocturia   . Personal  history of radiation therapy   . Routine general medical examination at a health care facility   . Symptomatic menopausal or female climacteric states     PAST SURGICAL HISTORY: Past Surgical History:  Procedure Laterality Date  . BREAST LUMPECTOMY Left   . BREAST LUMPECTOMY WITH SENTINEL LYMPH NODE BIOPSY Left 11/09/2016   Procedure: LEFT BREAST LUMPECTOMY WITH SENTINEL LYMPH NODE BX;  Surgeon: Kathy Keens, MD;  Location: [facility];  Service: General;  Laterality: Left;  . TONSILLECTOMY     age 26    FAMILY HISTORY:  Family History  Problem Relation Age of Onset  . Alzheimer's disease Mother   . Lung cancer Father        smoker  . Colon cancer Neg Hx   . Esophageal cancer Neg Hx   . Rectal cancer Neg Hx   . Stomach cancer Neg Hx   The patient's father died from lung cancer in the setting of tobacco. Patient's mother died at age 5 with Alzheimer's is the patient has no brothers, 2 sisters. There is no history of breast or ovarian cancer in the family    GYNECOLOGIC HISTORY:  No LMP recorded. Patient is postmenopausal. Menarche age 12, the patient is GX P0. She stopped having periods approximately 2013. She has been on oral contraceptives more than 5 years, stopping in mid-November 2017.   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  She retired April 2019.  Her husband Kathy Bishop is self-employed in [employer] and runs a [employer]. At home is just the 2 of them plus a dog and 3 cats.                          ADVANCED DIRECTIVES: Not in place   HEALTH MAINTENANCE: Social History   Tobacco Use  . Smoking status: Former Research scientist (life sciences)  . Smokeless tobacco: Never Used  . Tobacco comment: smoked years ago in her 20's socially  Substance Use Topics  . Alcohol use: Yes    Comment: social  . Drug use: No    Colonoscopy:  Pap:  Bone Density:  No Known Allergies  Current Outpatient  Medications  Medication Sig Dispense Refill  . escitalopram (LEXAPRO) 20 MG tablet Take 1 tablet (20 mg total) by mouth daily. 90 tablet 1  . exemestane (AROMASIN) 25 MG tablet Take 1 tablet (25 mg total) by mouth daily after breakfast. 90 tablet 4  . omeprazole (PRILOSEC) 20 MG capsule TAKE ONE CAPSULE SHORTLY BEFORE BREAKFAST MEAL EACH DAY 90 capsule 1  . promethazine (PHENERGAN) 25 MG tablet Take 1 tablet (25 mg total) by mouth every 8 (eight) hours as needed for nausea. 60 tablet 2   No current facility-administered medications for this visit.    OBJECTIVE: white woman in no acute distress  Vitals:   02/09/21 1054  BP: (!) 137/93  Pulse: 78  Resp: 18  Temp: (!) 97.5 F (36.4 C)  SpO2: 98%     Body mass index is 22.27 kg/m.  ECOG FS:0 - Asymptomatic Filed Weights   02/09/21 1054  Weight: 125 lb 11.2 oz (57 kg)    Sclerae unicteric, EOMs intact Wearing a mask No cervical or supraclavicular adenopathy Lungs no rales or rhonchi Heart regular rate and rhythm Abd soft, nontender, positive bowel sounds MSK no focal spinal tenderness, no upper extremity lymphedema Neuro: nonfocal, well oriented, appropriate affect Breasts: The right breast is benign.  The left breast has undergone lumpectomy and radiation.  There is no evidence of local recurrence.  Both axillae are benign.   LAB RESULTS:  CMP     Component Value Date/Time   NA 140 01/24/2020 1037   NA 140 10/12/2017 1302   K 4.1 01/24/2020 1037   K 4.0 10/12/2017 1302   CL 104 01/24/2020 1037   CO2 26 01/24/2020 1037   CO2 27 10/12/2017 1302   GLUCOSE 118 (H) 01/24/2020 1037   GLUCOSE 101 10/12/2017 1302   BUN 13 01/24/2020 1037   BUN 11.4 10/12/2017 1302   CREATININE 0.86 01/24/2020 1037   CREATININE 0.8 10/12/2017 1302   CALCIUM 9.4 01/24/2020 1037   CALCIUM 9.3 10/12/2017 1302   PROT 7.0 01/24/2020 1037   PROT 6.9 10/12/2017 1302   ALBUMIN 4.5 01/24/2020 1037   ALBUMIN 4.3 10/12/2017 1302   AST 18  01/24/2020 1037   AST 17 10/12/2017 1302   ALT 15 01/24/2020 1037   ALT 13 10/12/2017 1302   ALKPHOS 69 01/24/2020 1037   ALKPHOS 66 10/12/2017 1302   BILITOT 0.6 01/24/2020 1037   BILITOT 0.59 10/12/2017 1302   GFRNONAA >60 01/24/2020 1037   GFRAA >60 01/24/2020 1037    INo results found for: SPEP, UPEP  Lab Results  Component Value Date   WBC 3.2 (L) 02/09/2021   NEUTROABS 1.9 02/09/2021   HGB 13.9 02/09/2021   HCT 39.3 02/09/2021   MCV 94.2 02/09/2021   PLT 219 02/09/2021      Chemistry      Component Value Date/Time   NA 140 01/24/2020 1037   NA 140 10/12/2017 1302   K 4.1 01/24/2020 1037   K 4.0 10/12/2017 1302   CL 104 01/24/2020 1037   CO2 26 01/24/2020 1037   CO2 27 10/12/2017 1302   BUN 13 01/24/2020 1037   BUN 11.4 10/12/2017 1302   CREATININE 0.86 01/24/2020 1037   CREATININE 0.8 10/12/2017 1302      Component Value Date/Time   CALCIUM 9.4 01/24/2020 1037   CALCIUM 9.3 10/12/2017 1302   ALKPHOS 69 01/24/2020 1037   ALKPHOS 66 10/12/2017 1302   AST 18 01/24/2020 1037   AST 17 10/12/2017 1302   ALT 15 01/24/2020 1037   ALT 13 10/12/2017 1302   BILITOT 0.6 01/24/2020 1037   BILITOT 0.59 10/12/2017 1302       No results found for: LABCA2  No components found for: LABCA125  No results for input(s): INR in the last 168 hours.  Urinalysis    Component Value Date/Time   COLORURINE YELLOW 02/14/2013 0703   APPEARANCEUR CLOUDY (A) 02/14/2013 0703   LABSPEC 1.018 02/14/2013 0703   PHURINE 8.0 02/14/2013 0703   GLUCOSEU NEGATIVE 02/14/2013 0703   HGBUR NEGATIVE 02/14/2013 0703   BILIRUBINUR NEGATIVE 02/14/2013 0703   KETONESUR 15 (A) 02/14/2013 0703   PROTEINUR NEGATIVE 02/14/2013 0703   UROBILINOGEN 0.2 02/14/2013 0703   NITRITE NEGATIVE 02/14/2013 0703   LEUKOCYTESUR NEGATIVE 02/14/2013 0703    STUDIES: No results found.   ELIGIBLE FOR AVAILABLE RESEARCH PROTOCOL:  no  ASSESSMENT: 70 y.o. Kathy Bishop woman status post left breast upper  outer quadrant biopsy x2 on 08/20/2016 for a clinical mT2 N0, stage IIA invasive ductal carcinoma, both tumors showing identical prognostic panels: Estrogen and progesterone receptor strongly positive, HER-2 negative, with an MIB-1 of 10-15%   (1) neoadjuvant anastrozole started 09/10/2016 in anticipation possible surgical delays  (2) status post left lumpectomy and sentinel lymph node sampling 11/09/2016 for a mpT2 pN0, stage IB (2018 classification) invasive ductal carcinoma, with negative margins.  (3) Oncotype DX score of 25 predicts a risk of outside the breast recurrence of 17% if the patient's only systemic therapy is tamoxifen for 5 years. It predicts a risk reduction of approximately an additional 4% if chemotherapy is added.  (a) the patient opted against adjuvant chemotherapy  (4) adjuvant radiation  12/20/16 - 01/17/17 Site/dose:   Left Breast treated to 40.05 Gy in 15 fx of 2.67 Gy/fx  and a 10 Gy boost in 5 fx of 2 Gy/fx   (5) anastrozole started January 2018, stopped January 2019 with arthralgias/myalgias  (a) bone density 12/08/2016 showed a T score of -0.2 (normal).  (6) tamoxifen started 11/28/2017, discontinued January 2021 secondary to hot flashes  (7) started Aromasin first week in February 2021--will complete 5 years of antiestrogens February 2023   PLAN: Tesneem is now a little over 4 years out from definitive surgery for her breast cancer with no evidence of disease recurrence.  This is very favorable.  She is tolerating anastrozole well.  It is hateful that the price has gone up again.  She will investigate whether going through [facility] or similar might make it cheaper and let us know.  We will be glad to repeat enter the order as needed  I gave her information on our pelvic rehab program.  If she is interested she will let us know.  Otherwise she will have her mammogram in May and she will return to see [place] in February of next year at which time she will be ready to  "graduate"  Total encounter time 20 minutes.* Total encounter time 25 minutes.*   Jamari Diana, Kathy Dad, MD  02/09/21 11:23 AM Medical Oncology and Hematology [facility] [place], [place] [address] Tel. [phone]    Fax. [phone]   I, Wilburn Mylar, am acting as scribe for Dr. Virgie Bishop. Essie Gehret.  I, Lurline Del MD, have reviewed the above documentation for accuracy and completeness, and I agree with the above.   *Total Encounter Time as defined by the Centers for Medicare and Medicaid Services includes, in addition to the face-to-face time of a patient visit (documented in the note above) non-face-to-face time: obtaining and reviewing outside history, ordering and reviewing medications, tests or procedures, care coordination (communications with other health care professionals or caregivers) and documentation in the medical record.

## 2021-02-09 ENCOUNTER — Inpatient Hospital Stay: Payer: PPO | Admitting: Oncology

## 2021-02-09 ENCOUNTER — Other Ambulatory Visit: Payer: Self-pay

## 2021-02-09 ENCOUNTER — Inpatient Hospital Stay: Payer: PPO | Attending: Oncology

## 2021-02-09 VITALS — BP 137/93 | HR 78 | Temp 97.5°F | Resp 18 | Ht 63.0 in | Wt 125.7 lb

## 2021-02-09 DIAGNOSIS — C50412 Malignant neoplasm of upper-outer quadrant of left female breast: Secondary | ICD-10-CM

## 2021-02-09 DIAGNOSIS — F418 Other specified anxiety disorders: Secondary | ICD-10-CM | POA: Diagnosis not present

## 2021-02-09 DIAGNOSIS — Z801 Family history of malignant neoplasm of trachea, bronchus and lung: Secondary | ICD-10-CM | POA: Diagnosis not present

## 2021-02-09 DIAGNOSIS — Z87891 Personal history of nicotine dependence: Secondary | ICD-10-CM | POA: Insufficient documentation

## 2021-02-09 DIAGNOSIS — Z79811 Long term (current) use of aromatase inhibitors: Secondary | ICD-10-CM | POA: Diagnosis not present

## 2021-02-09 DIAGNOSIS — Z79899 Other long term (current) drug therapy: Secondary | ICD-10-CM | POA: Diagnosis not present

## 2021-02-09 DIAGNOSIS — Z170 Estrogen receptor positive status [ER+]: Secondary | ICD-10-CM | POA: Diagnosis not present

## 2021-02-09 DIAGNOSIS — Z923 Personal history of irradiation: Secondary | ICD-10-CM | POA: Diagnosis not present

## 2021-02-09 LAB — CMP (CANCER CENTER ONLY)
ALT: 11 U/L (ref 0–44)
AST: 18 U/L (ref 15–41)
Albumin: 4.3 g/dL (ref 3.5–5.0)
Alkaline Phosphatase: 59 U/L (ref 38–126)
Anion gap: 8 (ref 5–15)
BUN: 17 mg/dL (ref 8–23)
CO2: 27 mmol/L (ref 22–32)
Calcium: 9.4 mg/dL (ref 8.9–10.3)
Chloride: 104 mmol/L (ref 98–111)
Creatinine: 0.85 mg/dL (ref 0.44–1.00)
GFR, Estimated: >60 mL/min (ref >60)
Glucose, Bld: 139 mg/dL — ABNORMAL HIGH (ref 70–99)
Potassium: 5 mmol/L (ref 3.5–5.1)
Sodium: 139 mmol/L (ref 135–145)
Total Bilirubin: 0.6 mg/dL (ref 0.3–1.2)
Total Protein: 6.7 g/dL (ref 6.5–8.1)

## 2021-02-09 LAB — CBC WITH DIFFERENTIAL (CANCER CENTER ONLY)
Abs Immature Granulocytes: 0.01 10*3/uL (ref 0.00–0.07)
Basophils Absolute: 0 10*3/uL (ref 0.0–0.1)
Basophils Relative: 1 %
Eosinophils Absolute: 0.1 10*3/uL (ref 0.0–0.5)
Eosinophils Relative: 2 %
HCT: 39.3 % (ref 36.0–46.0)
Hemoglobin: 13.9 g/dL (ref 12.0–15.0)
Immature Granulocytes: 0 %
Lymphocytes Relative: 32 %
Lymphs Abs: 1.1 10*3/uL (ref 0.7–4.0)
MCH: 33.3 pg (ref 26.0–34.0)
MCHC: 35.4 g/dL (ref 30.0–36.0)
MCV: 94.2 fL (ref 80.0–100.0)
Monocytes Absolute: 0.2 10*3/uL (ref 0.1–1.0)
Monocytes Relative: 7 %
Neutro Abs: 1.9 10*3/uL (ref 1.7–7.7)
Neutrophils Relative %: 58 %
Platelet Count: 219 10*3/uL (ref 150–400)
RBC: 4.17 MIL/uL (ref 3.87–5.11)
RDW: 11.9 % (ref 11.5–15.5)
WBC Count: 3.2 10*3/uL — ABNORMAL LOW (ref 4.0–10.5)
nRBC: 0 % (ref 0.0–0.2)

## 2021-02-25 ENCOUNTER — Other Ambulatory Visit: Payer: Self-pay | Admitting: Oncology

## 2021-02-25 DIAGNOSIS — Z853 Personal history of malignant neoplasm of breast: Secondary | ICD-10-CM

## 2021-03-02 ENCOUNTER — Other Ambulatory Visit: Payer: Self-pay

## 2021-03-02 ENCOUNTER — Other Ambulatory Visit: Payer: Self-pay | Admitting: Oncology

## 2021-03-02 ENCOUNTER — Ambulatory Visit
Admission: RE | Admit: 2021-03-02 | Discharge: 2021-03-02 | Disposition: A | Discharge status: Home / Self Care | Payer: PPO | Source: Ambulatory Visit | Attending: Oncology | Admitting: Oncology

## 2021-03-02 DIAGNOSIS — Z1231 Encounter for screening mammogram for malignant neoplasm of breast: Secondary | ICD-10-CM | POA: Diagnosis not present

## 2021-03-02 DIAGNOSIS — Z853 Personal history of malignant neoplasm of breast: Secondary | ICD-10-CM

## 2021-03-24 ENCOUNTER — Other Ambulatory Visit: Payer: Self-pay | Admitting: *Deleted

## 2021-03-24 MED ORDER — EXEMESTANE 25 MG PO TABS: 25.0000 mg | ORAL_TABLET | Freq: Every day | ORAL | 4 refills | Status: DC

## 2021-03-26 ENCOUNTER — Other Ambulatory Visit: Payer: Self-pay | Admitting: *Deleted

## 2021-03-26 MED ORDER — EXEMESTANE 25 MG PO TABS: 25.0000 mg | ORAL_TABLET | Freq: Every day | ORAL | 4 refills | Status: DC

## 2021-03-30 DIAGNOSIS — H40033 Anatomical narrow angle, bilateral: Secondary | ICD-10-CM | POA: Diagnosis not present

## 2021-03-30 DIAGNOSIS — H2513 Age-related nuclear cataract, bilateral: Secondary | ICD-10-CM | POA: Diagnosis not present

## 2021-04-06 ENCOUNTER — Other Ambulatory Visit: Payer: Self-pay | Admitting: Family Medicine

## 2021-04-06 DIAGNOSIS — F325 Major depressive disorder, single episode, in full remission: Secondary | ICD-10-CM

## 2021-07-02 ENCOUNTER — Encounter: Payer: Self-pay | Admitting: Hematology and Oncology

## 2021-07-03 ENCOUNTER — Other Ambulatory Visit: Payer: Self-pay

## 2021-07-03 MED ORDER — LETROZOLE 2.5 MG PO TABS
2.5000 mg | ORAL_TABLET | Freq: Every day | ORAL | 1 refills | Status: DC
Start: 2021-07-03 — End: 2021-11-12

## 2021-09-14 ENCOUNTER — Telehealth: Payer: Self-pay | Admitting: Family Medicine

## 2021-09-14 NOTE — Telephone Encounter (Signed)
Error

## 2021-09-15 ENCOUNTER — Telehealth (INDEPENDENT_AMBULATORY_CARE_PROVIDER_SITE_OTHER): Payer: PPO | Admitting: Primary Care

## 2021-09-15 ENCOUNTER — Other Ambulatory Visit: Payer: Self-pay

## 2021-09-15 ENCOUNTER — Encounter: Payer: Self-pay | Admitting: Primary Care

## 2021-09-15 VITALS — Ht 63.0 in | Wt 125.0 lb

## 2021-09-15 DIAGNOSIS — U071 COVID-19: Secondary | ICD-10-CM | POA: Diagnosis not present

## 2021-09-15 MED ORDER — NIRMATRELVIR/RITONAVIR (PAXLOVID)TABLET: 3.0000 | ORAL_TABLET | Freq: Two times a day (BID) | ORAL | 0 refills | Status: AC

## 2021-09-15 NOTE — Assessment & Plan Note (Signed)
Within window for antiviral treatment and certainly qualifies.  She opts to try treatment. GFR of >60 and she is not on anticoagulation treatment.  Rx for Paxlovid sent to pharmacy. Discussed instructions for use and potential side effects.  Follow up PRN. She appeared stable for outpatient treatment.

## 2021-09-15 NOTE — Patient Instructions (Signed)
Start Paxlovid for Covid-19 infection.  Take 3 capsules by mouth twice daily for five days.  Please reach out if you have further questions.  It was a pleasure meeting you!

## 2021-09-15 NOTE — Progress Notes (Signed)
Patient ID: Kathy Bishop, female    DOB: Mar 14, 1951, 70 y.o.   MRN: [id number]  Virtual visit completed through [employer], a video enabled telemedicine application. Due to national recommendations of social distancing due to COVID-19, a virtual visit is felt to be most appropriate for this patient at this time. Reviewed limitations, risks, security and privacy concerns of performing a virtual visit and the availability of in person appointments. I also reviewed that there may be a patient responsible charge related to this service. The patient agreed to proceed.   Patient location: home Provider location: [place] at [facility], office Persons participating in this virtual visit: patient, provider   If any vitals were documented, they were collected by patient at home unless specified below.    Ht 5\' 3"  (1.6 m)   Wt 125 lb (56.7 kg)   BMI 22.14 kg/m    CC: Positive Covid Subjective:   HPI: Kathy Bishop is a 70 y.o. female patient of Dr. Einar Pheasant with a history of leukopenia, breast cancer, elevated blood pressure readings, tobacco abuse presenting on 09/15/2021 to discuss Covid-19 infection.  Symptom onset 5 days ago with a mild cough. She then progressed into chills, body aches, worsening cough, headaches, decreased appetite and nausea.    She tested positive for Covid-19 with an at home test on 09/13/21 (2 days ago).   She's taken Ibuprofen, Dramamine with some improvement temporarily. Today she's feeling about the same. She's interested in learning more about the antiviral treatment as she's not improving much.   She's completed three Covid-19 vaccines.      Relevant past medical, surgical, family and social history reviewed and updated as indicated. Interim medical history since our last visit reviewed. Allergies and medications reviewed and updated. Outpatient Medications Prior to Visit  Medication Sig Dispense Refill   escitalopram (LEXAPRO) 20 MG tablet Take 1 tablet (20 mg  total) by mouth daily. Appt with PCP needed for further refills. 135 tablet 1   letrozole (FEMARA) 2.5 MG tablet Take 1 tablet (2.5 mg total) by mouth daily. 90 tablet 1   promethazine (PHENERGAN) 25 MG tablet Take 1 tablet (25 mg total) by mouth every 8 (eight) hours as needed for nausea. 60 tablet 2   omeprazole (PRILOSEC) 20 MG capsule TAKE ONE CAPSULE SHORTLY BEFORE BREAKFAST MEAL EACH DAY (Patient not taking: Reported on 09/15/2021) 90 capsule 1   No facility-administered medications prior to visit.     Per HPI unless specifically indicated in ROS section below Review of Systems  Constitutional:  Positive for chills, fatigue and fever.  Respiratory:  Positive for cough.   Gastrointestinal:  Positive for nausea.  Musculoskeletal:  Positive for arthralgias and myalgias.  Neurological:  Positive for headaches.  Objective:  Ht 5\' 3"  (1.6 m)   Wt 125 lb (56.7 kg)   BMI 22.14 kg/m   Wt Readings from Last 3 Encounters:  09/15/21 125 lb (56.7 kg)  02/09/21 125 lb 11.2 oz (57 kg)  11/28/20 126 lb (57.2 kg)       Physical exam: General: Alert and oriented x 3, no distress, does appear tired.  Pulmonary: Speaks in complete sentences without increased work of breathing, no cough during visit.  Psychiatric: Normal mood, thought content, and behavior.     Results for orders placed or performed in visit on 02/09/21  CMP ([facility] only)  Result Value Ref Range   Sodium 139 135 - 145 mmol/L   Potassium 5.0 3.5 - 5.1 mmol/L  Chloride 104 98 - 111 mmol/L   CO2 27 22 - 32 mmol/L   Glucose, Bld 139 (H) 70 - 99 mg/dL   BUN 17 8 - 23 mg/dL   Creatinine 0.85 0.44 - 1.00 mg/dL   Calcium 9.4 8.9 - 10.3 mg/dL   Total Protein 6.7 6.5 - 8.1 g/dL   Albumin 4.3 3.5 - 5.0 g/dL   AST 18 15 - 41 U/L   ALT 11 0 - 44 U/L   Alkaline Phosphatase 59 38 - 126 U/L   Total Bilirubin 0.6 0.3 - 1.2 mg/dL   GFR, Estimated >60 >60 mL/min   Anion gap 8 5 - 15  CBC with Differential ([facility]  Only)  Result Value Ref Range   WBC Count 3.2 (L) 4.0 - 10.5 K/uL   RBC 4.17 3.87 - 5.11 MIL/uL   Hemoglobin 13.9 12.0 - 15.0 g/dL   HCT 39.3 36.0 - 46.0 %   MCV 94.2 80.0 - 100.0 fL   MCH 33.3 26.0 - 34.0 pg   MCHC 35.4 30.0 - 36.0 g/dL   RDW 11.9 11.5 - 15.5 %   Platelet Count 219 150 - 400 K/uL   nRBC 0.0 0.0 - 0.2 %   Neutrophils Relative % 58 %   Neutro Abs 1.9 1.7 - 7.7 K/uL   Lymphocytes Relative 32 %   Lymphs Abs 1.1 0.7 - 4.0 K/uL   Monocytes Relative 7 %   Monocytes Absolute 0.2 0.1 - 1.0 K/uL   Eosinophils Relative 2 %   Eosinophils Absolute 0.1 0.0 - 0.5 K/uL   Basophils Relative 1 %   Basophils Absolute 0.0 0.0 - 0.1 K/uL   Immature Granulocytes 0 %   Abs Immature Granulocytes 0.01 0.00 - 0.07 K/uL   Assessment & Plan:   Problem List Items Addressed This Visit       Other   COVID-19 virus infection - Primary    Within window for antiviral treatment and certainly qualifies.  She opts to try treatment. GFR of >60 and she is not on anticoagulation treatment.  Rx for Paxlovid sent to pharmacy. Discussed instructions for use and potential side effects.  Follow up PRN. She appeared stable for outpatient treatment.      Relevant Medications   nirmatrelvir/ritonavir EUA (PAXLOVID) 20 x 150 MG & 10 x 100MG  TABS     Meds ordered this encounter  Medications   nirmatrelvir/ritonavir EUA (PAXLOVID) 20 x 150 MG & 10 x 100MG  TABS    Sig: Take 3 tablets by mouth 2 (two) times daily for 5 days. Patient GFR is >60    Dispense:  30 tablet    Refill:  0    Order Specific Question:   Supervising Provider    Answer:   BEDSOLE, AMY E [[id number]]   No orders of the defined types were placed in this encounter.   I discussed the assessment and treatment plan with the patient. The patient was provided an opportunity to ask questions and all were answered. The patient agreed with the plan and demonstrated an understanding of the instructions. The patient was advised to call  back or seek an in-person evaluation if the symptoms worsen or if the condition fails to improve as anticipated.  Follow up plan:  Start Paxlovid for Covid-19 infection.  Take 3 capsules by mouth twice daily for five days.  Please reach out if you have further questions.  It was a pleasure meeting you!   Pleas Koch, NP

## 2021-09-16 DIAGNOSIS — U071 COVID-19: Secondary | ICD-10-CM

## 2021-09-17 MED ORDER — ONDANSETRON 4 MG PO TBDP: 4.0000 mg | ORAL_TABLET | Freq: Three times a day (TID) | ORAL | 0 refills | Status: DC | PRN

## 2021-09-18 MED ORDER — PROMETHAZINE HCL 25 MG PO TABS: 12.5000 mg | ORAL_TABLET | Freq: Three times a day (TID) | ORAL | 0 refills | Status: DC | PRN

## 2021-09-23 ENCOUNTER — Telehealth: Payer: PPO | Admitting: Physician Assistant

## 2021-09-23 ENCOUNTER — Ambulatory Visit
Admission: EM | Admit: 2021-09-23 | Discharge: 2021-09-23 | Disposition: A | Discharge status: Home / Self Care | Payer: PPO

## 2021-09-23 ENCOUNTER — Ambulatory Visit
Admission: EM | Admit: 2021-09-23 | Discharge: 2021-09-23 | Disposition: A | Discharge status: Home / Self Care | Payer: PPO | Attending: Emergency Medicine | Admitting: Emergency Medicine

## 2021-09-23 ENCOUNTER — Other Ambulatory Visit: Payer: Self-pay

## 2021-09-23 DIAGNOSIS — R110 Nausea: Secondary | ICD-10-CM

## 2021-09-23 DIAGNOSIS — F325 Major depressive disorder, single episode, in full remission: Secondary | ICD-10-CM | POA: Diagnosis not present

## 2021-09-23 DIAGNOSIS — R112 Nausea with vomiting, unspecified: Secondary | ICD-10-CM

## 2021-09-23 DIAGNOSIS — K5904 Chronic idiopathic constipation: Secondary | ICD-10-CM | POA: Diagnosis not present

## 2021-09-23 MED ORDER — ESCITALOPRAM OXALATE 20 MG PO TABS: 20.0000 mg | ORAL_TABLET | Freq: Every day | ORAL | 0 refills | Status: DC

## 2021-09-23 MED ORDER — LINACLOTIDE 145 MCG PO CAPS: 145.0000 ug | ORAL_CAPSULE | Freq: Every day | ORAL | 0 refills | Status: DC

## 2021-09-23 MED ORDER — ONDANSETRON 4 MG PO TBDP: 4.0000 mg | ORAL_TABLET | Freq: Three times a day (TID) | ORAL | 0 refills | Status: DC | PRN

## 2021-09-23 NOTE — Progress Notes (Signed)
Virtual Visit Consent   Tasheema Perrone, you are scheduled for a virtual visit with a [facility] provider today.     Just as with appointments in the office, your consent must be obtained to participate.  Your consent will be active for this visit and any virtual visit you may have with one of our providers in the next 365 days.     If you have a MyChart account, a copy of this consent can be sent to you electronically.  All virtual visits are billed to your insurance company just like a traditional visit in the office.    As this is a virtual visit, video technology does not allow for your provider to perform a traditional examination.  This may limit your provider's ability to fully assess your condition.  If your provider identifies any concerns that need to be evaluated in person or the need to arrange testing (such as labs, EKG, etc.), we will make arrangements to do so.     Although advances in technology are sophisticated, we cannot ensure that it will always work on either your end or our end.  If the connection with a video visit is poor, the visit may have to be switched to a telephone visit.  With either a video or telephone visit, we are not always able to ensure that we have a secure connection.     I need to obtain your verbal consent now.   Are you willing to proceed with your visit today?    Aziza Landowski has provided verbal consent on 09/23/2021 for a virtual visit (video or telephone).   Mar Daring, PA-C   Date: 09/23/2021 1:40 PM   Virtual Visit via Video Note   IMar Daring, connected with  Danielly Ackerley  ([id number], 1951/08/09) on 09/23/21 at  1:00 PM EST by a video-enabled telemedicine application and verified that I am speaking with the correct person using two identifiers.  Location: Patient: Virtual Visit Location Patient: Home Provider: Virtual Visit Location Provider: Home Office   I discussed the limitations of evaluation and management by  telemedicine and the availability of in person appointments. The patient expressed understanding and agreed to proceed.    History of Present Illness: Kathy Bishop is a 69 y.o. who identifies as a female who was assigned female at birth, and is being seen today for Covid 19, nausea.  HPI: URI  Associated symptoms include nausea.   Symptoms started and she tested positive for Covid 19 on 09/13/21, started Paxlovid on 09/15/21, only took two doses (one day) and started having nausea and bad taste. Has had zofran ODT x 3 doses. Started Promethazine Saturday the 10th. Had been working, but now not. Covid symptoms of chills, fevers, body aches have improved. Mild cough remains. Still testing positive for Covid 19. Having extreme constipation and nausea. All food irritates stomach. No abdominal pain, tender to touch. Nausea is constant with food or not. She is having dry heaves, but unable to vomit up food. Having some bile (stomach acid) emesis. Nausea is severe and causing decreased appetite and decreased intake. Nausea worsens with any food intake.   Problems:  Patient Active Problem List   Diagnosis Date Noted   COVID-19 virus infection 09/15/2021   Elevated blood pressure reading 09/22/2020   Insomnia 11/13/2019   Back pain 11/13/2019   Rectal bleeding 08/30/2018   Unintended weight loss 08/30/2018   Nausea 08/30/2018   Rash 04/28/2017   Malignant neoplasm of upper-outer  quadrant of left breast in female, estrogen receptor positive ([place]) 08/31/2016   BPV (benign positional vertigo) 03/09/2013   Fatigue 02/15/2012   Leukopenia 02/15/2012   MENOPAUSE-RELATED VASOMOTOR SYMPTOMS, HOT FLASHES 12/26/2009   BACK PAIN, LUMBAR, WITH RADICULOPATHY 06/17/2009   NOCTURIA 05/06/2008   Depression, major, single episode, complete remission ([place]) 03/02/2007    Allergies: No Known Allergies Medications:  Current Outpatient Medications:    escitalopram (LEXAPRO) 20 MG tablet, Take 1 tablet (20 mg total)  by mouth daily. Appt with PCP needed for further refills., Disp: 135 tablet, Rfl: 1   letrozole (FEMARA) 2.5 MG tablet, Take 1 tablet (2.5 mg total) by mouth daily., Disp: 90 tablet, Rfl: 1   omeprazole (PRILOSEC) 20 MG capsule, TAKE ONE CAPSULE SHORTLY BEFORE BREAKFAST MEAL EACH DAY (Patient not taking: Reported on 09/15/2021), Disp: 90 capsule, Rfl: 1   promethazine (PHENERGAN) 25 MG tablet, Take 0.5-1 tablets (12.5-25 mg total) by mouth every 8 (eight) hours as needed for nausea., Disp: 15 tablet, Rfl: 0  Observations/Objective: Patient is well-developed, well-nourished in no acute distress.  Resting comfortably at home.  Head is normocephalic, atraumatic.  No labored breathing.  Speech is clear and coherent with logical content.  Patient is alert and oriented at baseline.    Assessment and Plan: 1. Prolonged severe nausea and vomiting  - Decreased appetite/intake, severe constipation and nausea, no relief with Zofran or Promethazine - Advised patient to seek immediate care at local UC ER for further evaluation and consideration of imaging and labs - High risk for dehydration complications since symptoms present over 10 days  Follow Up Instructions: I discussed the assessment and treatment plan with the patient. The patient was provided an opportunity to ask questions and all were answered. The patient agreed with the plan and demonstrated an understanding of the instructions.  A copy of instructions were sent to the patient via MyChart unless otherwise noted below.    The patient was advised to call back or seek an in-person evaluation if the symptoms worsen or if the condition fails to improve as anticipated.  Time:  I spent 15 minutes with the patient via telehealth technology discussing the above problems/concerns.    Mar Daring, PA-C

## 2021-09-23 NOTE — Patient Instructions (Signed)
Kathy Bishop, thank you for joining Mar Daring, PA-C for today's virtual visit.  While this provider is not your primary care provider (PCP), if your PCP is located in our provider database this encounter information will be shared with them immediately following your visit.  Consent: (Patient) Kathy Bishop provided verbal consent for this virtual visit at the beginning of the encounter.  Current Medications:  Current Outpatient Medications:    escitalopram (LEXAPRO) 20 MG tablet, Take 1 tablet (20 mg total) by mouth daily. Appt with PCP needed for further refills., Disp: 135 tablet, Rfl: 1   letrozole (FEMARA) 2.5 MG tablet, Take 1 tablet (2.5 mg total) by mouth daily., Disp: 90 tablet, Rfl: 1   omeprazole (PRILOSEC) 20 MG capsule, TAKE ONE CAPSULE SHORTLY BEFORE BREAKFAST MEAL EACH DAY (Patient not taking: Reported on 09/15/2021), Disp: 90 capsule, Rfl: 1   promethazine (PHENERGAN) 25 MG tablet, Take 0.5-1 tablets (12.5-25 mg total) by mouth every 8 (eight) hours as needed for nausea., Disp: 15 tablet, Rfl: 0   Medications ordered in this encounter:  No orders of the defined types were placed in this encounter.    *If you need refills on other medications prior to your next appointment, please contact your pharmacy*  Follow-Up: Call back or seek an in-person evaluation if the symptoms worsen or if the condition fails to improve as anticipated.  Other Instructions Based on what you shared with me, I feel your condition warrants further evaluation as soon as possible at an Emergency department.    If you are having a true medical emergency please call 911.      Emergency [facility]  Get Driving Directions  [phone]  [address]  [place], [place] [address]  Open 24/7/365      [facility] Emergency Department at [place]  [address]  [place], [place] [address]  Open 24/7/365     Emergency [facility]  Get Driving Directions  [phone]  [address], [place] [address]  Open 24/7/365      [facility]  Get Driving Directions  [phone]  [address]  [place], [place] [address]  Open 24/7/365    [facility]  Emergency [place]  Get Driving Directions  [phone]  [place], [place] [address]  Open 24/7/365    [facility]  Emergency Department- [place]  Get Driving Directions  [address]  [place], [place] [address]  Open 24/7/365    [facility]  Emergency [facility]  Get Driving Directions  [phone]  [address]  [place], [place] [address]  Open 24/7/365    If you have been instructed to have an in-person evaluation today at a local Urgent Care facility, please use the link below. It will take you to a list of all of our available [facility], including address, phone number and hours of operation. Please do not delay care.  [facility]  If you or a family member do not have a primary care provider, use the link below to schedule a visit and establish care. When you choose a [facility] primary care physician or advanced practice provider, you gain a long-term partner in health. Find a Primary Care Provider  Learn more about [facility]'s in-office and virtual care options: [facility] -  Get Care Now

## 2021-09-23 NOTE — ED Provider Notes (Signed)
[facility]    CSN: [id number] Arrival date & time: 09/23/21  1402    HISTORY  No chief complaint on file.  HPI Kathy Bishop is a 70 y.o. female. Patient states she tested positive for covid on 09/13/2021 and retested and is still positive. She reports having severe nausea. Patient states zofran is not helpful.  EMR reviewed.  Patient received a renewal of prescription for Phenergan on September 18, 2021.  Patient has significant history of frequent episodes of nausea, has been evaluated by gastroenterology, patient also has a history of severe constipation.  Of note, patient had a video visit today, states she is currently very constipated.   Past Medical History:  Diagnosis Date   Anxiety    Breast cancer ([place])    Depression    Dermatitis    allergic   Foot pain, right    History of radiation therapy 12/20/16- 01/17/17   Left Breast 40.05 Gy in 15 fractions and a 10 Gy boost in 5 fractions.    Malignant neoplasm of upper-outer quadrant of left breast in female, estrogen receptor positive ([place]) 08/31/2016   Need for prophylactic hormone replacement therapy (postmenopausal)    Neuromuscular disorder ([place])    Nocturia    Personal history of radiation therapy    Routine general medical examination at a health care facility    Symptomatic menopausal or female climacteric states    Patient Active Problem List   Diagnosis Date Noted   COVID-19 virus infection 09/15/2021   Elevated blood pressure reading 09/22/2020   Insomnia 11/13/2019   Back pain 11/13/2019   Rectal bleeding 08/30/2018   Unintended weight loss 08/30/2018   Nausea 08/30/2018   Rash 04/28/2017   Malignant neoplasm of upper-outer quadrant of left breast in female, estrogen receptor positive ([place]) 08/31/2016   BPV (benign positional vertigo) 03/09/2013   Fatigue 02/15/2012   Leukopenia 02/15/2012   MENOPAUSE-RELATED VASOMOTOR SYMPTOMS, HOT FLASHES 12/26/2009   BACK PAIN, LUMBAR, WITH RADICULOPATHY  06/17/2009   NOCTURIA 05/06/2008   Depression, major, single episode, complete remission ([place]) 03/02/2007   Past Surgical History:  Procedure Laterality Date   BREAST LUMPECTOMY Left    BREAST LUMPECTOMY WITH SENTINEL LYMPH NODE BIOPSY Left 11/09/2016   Procedure: LEFT BREAST LUMPECTOMY WITH SENTINEL LYMPH NODE BX;  Surgeon: Coralie Keens, MD;  Location: [facility];  Service: General;  Laterality: Left;   TONSILLECTOMY     age 78   OB History   No obstetric history on file.    Home Medications    Prior to Admission medications   Medication Sig Start Date End Date Taking? Authorizing Provider  escitalopram (LEXAPRO) 20 MG tablet Take 1 tablet (20 mg total) by mouth daily. Appt with PCP needed for further refills. 04/07/21   Lesleigh Noe, MD  letrozole [facility]) 2.5 MG tablet Take 1 tablet (2.5 mg total) by mouth daily. 07/03/21   Nicholas Lose, MD  promethazine (PHENERGAN) 25 MG tablet Take 0.5-1 tablets (12.5-25 mg total) by mouth every 8 (eight) hours as needed for nausea. 09/18/21   Pleas Koch, NP    Family History Family History  Problem Relation Age of Onset   Alzheimer's disease Mother    Lung cancer Father        smoker   Colon cancer Neg Hx    Esophageal cancer Neg Hx    Rectal cancer Neg Hx    Stomach cancer Neg Hx    Social History Social History  Tobacco Use   Smoking status: Former   Smokeless tobacco: Never   Tobacco comments:    smoked years ago in her 20's socially  Substance Use Topics   Alcohol use: Yes    Comment: social   Drug use: No   Allergies   Patient has no known allergies.  Review of Systems Review of Systems Pertinent findings noted in history of present illness.   Physical Exam Triage Vital Signs ED Triage Vitals  Enc Vitals Group     BP 08/07/21 0827 (!) 147/82     Pulse Rate 08/07/21 0827 72     Resp 08/07/21 0827 18     Temp 08/07/21 0827 98.3 F (36.8 C)     Temp Source 08/07/21 0827 Oral     SpO2  08/07/21 0827 98 %     Weight --      Height --      Head Circumference --      Peak Flow --      Pain Score 08/07/21 0826 5     Pain Loc --      Pain Edu? --      Excl. in [place]? --   No data found.  Updated Vital Signs BP (!) 138/92    Pulse 79    Temp 99.4 F (37.4 C) (Oral)    Resp 18    SpO2 98%   Physical Exam Vitals and nursing note reviewed.  Constitutional:      General: She is not in acute distress.    Appearance: Normal appearance. She is not ill-appearing.  HENT:     Head: Normocephalic and atraumatic.  Eyes:     General: Lids are normal.        Right eye: No discharge.        Left eye: No discharge.     Extraocular Movements: Extraocular movements intact.     Conjunctiva/sclera: Conjunctivae normal.     Right eye: Right conjunctiva is not injected.     Left eye: Left conjunctiva is not injected.  Neck:     Trachea: Trachea and phonation normal.  Cardiovascular:     Rate and Rhythm: Normal rate and regular rhythm.     Pulses: Normal pulses.     Heart sounds: Normal heart sounds. No murmur heard.   No friction rub. No gallop.  Pulmonary:     Effort: Pulmonary effort is normal. No accessory muscle usage, prolonged expiration or respiratory distress.     Breath sounds: Normal breath sounds. No stridor, decreased air movement or transmitted upper airway sounds. No decreased breath sounds, wheezing, rhonchi or rales.  Chest:     Chest wall: No tenderness.  Abdominal:     General: Abdomen is flat. Bowel sounds are decreased. There is no distension.     Palpations: Abdomen is soft.     Tenderness: There is generalized abdominal tenderness. There is no right CVA tenderness or left CVA tenderness.     Hernia: No hernia is present.  Musculoskeletal:        General: Normal range of motion.     Cervical back: Normal range of motion and neck supple. Normal range of motion.  Lymphadenopathy:     Cervical: No cervical adenopathy.  Skin:    General: Skin is warm and dry.      Findings: No erythema or rash.  Neurological:     General: No focal deficit present.     Mental Status: She is alert and oriented to  person, place, and time.  Psychiatric:        Mood and Affect: Mood normal.        Behavior: Behavior normal.    Visual Acuity Right Eye Distance:   Left Eye Distance:   Bilateral Distance:    Right Eye Near:   Left Eye Near:    Bilateral Near:     UC Couse / Diagnostics / Procedures:    EKG  Radiology No results found.  Procedures Procedures (including critical care time)  UC Diagnoses / Final Clinical Impressions(s)   I have reviewed the triage vital signs and the nursing notes.  Pertinent labs & imaging results that were available during my care of the patient were reviewed by me and considered in my medical decision making (see chart for details).    Final diagnoses:  Chronic idiopathic constipation  Nausea without vomiting   Stop Phenergan, begin Linzess, continue Zofran.  Follow-up in a few days if not improved.  ED Prescriptions     Medication Sig Dispense Auth. Provider   escitalopram (LEXAPRO) 20 MG tablet Take 1 tablet (20 mg total) by mouth daily. Appt with PCP needed for further refills. 135 tablet Lynden Oxford Scales, PA-C   linaclotide (LINZESS) 145 MCG CAPS capsule Take 1 capsule (145 mcg total) by mouth daily before breakfast. 90 capsule Lynden Oxford Scales, PA-C   ondansetron (ZOFRAN-ODT) 4 MG disintegrating tablet Take 1 tablet (4 mg total) by mouth every 8 (eight) hours as needed for nausea or vomiting. 20 tablet Lynden Oxford Scales, PA-C      PDMP not reviewed this encounter.  Pending results:  Labs Reviewed - No data to display  Medications Ordered in UC: Medications - No data to display  Disposition Upon Discharge:  Condition: stable for discharge home Home: take medications as prescribed; routine discharge instructions as discussed; follow up as advised.  Patient presented with an acute  illness with associated systemic symptoms and significant discomfort requiring urgent management. In my opinion, this is a condition that a prudent lay person (someone who possesses an average knowledge of health and medicine) may potentially expect to result in complications if not addressed urgently such as respiratory distress, impairment of bodily function or dysfunction of bodily organs.   Routine symptom specific, illness specific and/or disease specific instructions were discussed with the patient and/or caregiver at length.   As such, the patient has been evaluated and assessed, work-up was performed and treatment was provided in alignment with urgent care protocols and evidence based medicine.  Patient/parent/caregiver has been advised that the patient may require follow up for further testing and treatment if the symptoms continue in spite of treatment, as clinically indicated and appropriate.  If the patient was tested for COVID-19, Influenza and/or RSV, then the patient/parent/guardian was advised to isolate at home pending the results of his/her diagnostic coronavirus test and potentially longer if theyre positive. I have also advised pt that if his/her COVID-19 test returns positive, it's recommended to self-isolate for at least 10 days after symptoms first appeared AND until fever-free for 24 hours without fever reducer AND other symptoms have improved or resolved. Discussed self-isolation recommendations as well as instructions for household member/close contacts as per the [facility] and [place] DHHS, and also gave patient the [facility] packet with this information.  Patient/parent/caregiver has been advised to return to the [facility] or PCP in 3-5 days if no better; to PCP or the Emergency Department if new signs and symptoms develop, or if the current  signs or symptoms continue to change or worsen for further workup, evaluation and treatment as clinically indicated and appropriate  The patient will follow up  with their current PCP if and as advised. If the patient does not currently have a PCP we will assist them in obtaining one.   The patient may need specialty follow up if the symptoms continue, in spite of conservative treatment and management, for further workup, evaluation, consultation and treatment as clinically indicated and appropriate.   Patient/parent/caregiver verbalized understanding and agreement of plan as discussed.  All questions were addressed during visit.  Please see discharge instructions below for further details of plan.  Discharge Instructions:   Discharge Instructions      To address your nausea, I recommend that we work on your constipation.  I have provided you with a prescription for medication called Linzess, is a very tiny capsule with a powerful result.  T  his medication pulls a significant amount of fluid into the bowels causing a rapid increase in the volume of your stool.  This large volume mechanically stimulates your bowels to move, this process can sometimes occur within a few hours after taking the medication.    Sometimes prescription insurance companies require prior authorization for [place], please do not be discouraged if is not available for pickup today.  I will do everything I can to get this medication approved for you.  I also recommend that you purchase an over-the-counter fiber capsule that contains psyllium husk, the same fiber that is found in product such as Metamucil and FiberCon.  I recommend that you take a full dose twice daily with plenty of water.    Daily fiber therapy keeps your bowels regular and helps you stay regular even when you are dealing with acute illness such as COVID-19.    Please discontinue promethazine (Phenergan) the now as this medication is known to be constipating.  Please continue ondansetron (Zofran) 3 times daily, I have renewed your prescription.  Thank you for visiting urgent care today.  I truly hope you feel  better soon.  Please not hesitate to reach out to me in the next several days if we have not completely resolved your nausea.         Lynden Oxford Scales, PA-C 09/23/21 [phone]

## 2021-09-23 NOTE — ED Triage Notes (Signed)
Patient states she tested positive for covid on 09/13/2021 and retested and is still positive. She reports having severe nausea. Patient states zofran is not helpful.

## 2021-09-23 NOTE — Discharge Instructions (Addendum)
To address your nausea, I recommend that we work on your constipation.  I have provided you with a prescription for medication called Linzess, is a very tiny capsule with a powerful result.  T  his medication pulls a significant amount of fluid into the bowels causing a rapid increase in the volume of your stool.  This large volume mechanically stimulates your bowels to move, this process can sometimes occur within a few hours after taking the medication.    Sometimes prescription insurance companies require prior authorization for [place], please do not be discouraged if is not available for pickup today.  I will do everything I can to get this medication approved for you.  I also recommend that you purchase an over-the-counter fiber capsule that contains psyllium husk, the same fiber that is found in product such as Metamucil and FiberCon.  I recommend that you take a full dose twice daily with plenty of water.    Daily fiber therapy keeps your bowels regular and helps you stay regular even when you are dealing with acute illness such as COVID-19.    Please discontinue promethazine (Phenergan) the now as this medication is known to be constipating.  Please continue ondansetron (Zofran) 3 times daily, I have renewed your prescription.  Thank you for visiting urgent care today.  I truly hope you feel better soon.  Please not hesitate to reach out to me in the next several days if we have not completely resolved your nausea.

## 2021-11-10 ENCOUNTER — Other Ambulatory Visit: Payer: Self-pay | Admitting: *Deleted

## 2021-11-10 DIAGNOSIS — C50412 Malignant neoplasm of upper-outer quadrant of left female breast: Secondary | ICD-10-CM

## 2021-11-11 NOTE — Progress Notes (Signed)
Patient Care Team: Lesleigh Noe, MD as PCP - General (Family Medicine) Coralie Keens, MD as Consulting Physician (General Surgery) Magrinat, Virgie Dad, MD (Inactive) as Consulting Physician (Oncology) Eppie Gibson, MD as Attending Physician (Radiation Oncology) Gardenia Phlegm, NP as Nurse Practitioner (Hematology and Oncology)  DIAGNOSIS:    ICD-10-CM   1. Malignant neoplasm of upper-outer quadrant of left breast in female, estrogen receptor positive ([place])  C50.412    Z17.0       SUMMARY OF ONCOLOGIC HISTORY: Oncology History Overview Note  3 1/   Malignant neoplasm of upper-outer quadrant of left breast in female, estrogen receptor positive ([place])  08/20/2016 Initial Biopsy   Left breast, needle core biopsy, 2 o'clock, 8cmfn: IDC, DCIS with necrosis, grade 2, ER+(95%), PR+(100%), Ki-67 15%, HER-2 negative (ratio 0.78). Left breast, needle core biopsy, 2 o'clock, 5cmfn: IDC, grade 2, ER+(95%), PR+(100%), Ki-67 10%, HER-2 negative (ratio 1.06).     09/10/2016 - 09/2021 Anti-estrogen oral therapy   Anastrozole daily bone density 12/08/2016 showed a T score of -0.2 (normal).   11/09/2016 Surgery   Left breast lumpectomy Ninfa Linden): IDC with calcifications, Grade II/III, two foci, 3cm and 0.6 cm, DCIS with calcifications, +lymphovascular invasion, 1 SLN negative, T2, N0   11/09/2016 Oncotype testing   25/17% intermediate risk   12/20/2016 - 01/17/2017 Radiation Therapy   Left Breast treated to 40.05 Gy in 15 fx of 2.67 Gy/fx  and a 10 Gy boost in 5 fx of 2 Gy/fx      CHIEF COMPLIANT: Follow-up of estrogen receptor positive breast cancer and to establish oncology care  INTERVAL HISTORY: Kathy Bishop is a 71 y.o. with above-mentioned history of estrogen receptor positive breast cancer, completed antiestrogen therapy with letrozole in December 2022. Mammogram on 03/02/2021 showed no evidence of malignancy. She presents to the clinic today for follow-up.  She denies any  lumps or nodules in the breast.  ALLERGIES:  has No Known Allergies.  MEDICATIONS:  Current Outpatient Medications  Medication Sig Dispense Refill   escitalopram (LEXAPRO) 20 MG tablet Take 1 tablet (20 mg total) by mouth daily. Appt with PCP needed for further refills. 135 tablet 0   linaclotide (LINZESS) 145 MCG CAPS capsule Take 1 capsule (145 mcg total) by mouth daily before breakfast. 90 capsule 0   No current facility-administered medications for this visit.    PHYSICAL EXAMINATION: ECOG PERFORMANCE STATUS: 1 - Symptomatic but completely ambulatory  Vitals:   11/12/21 1057  BP: (!) 145/94  Pulse: 71  Temp: 97.8 F (36.6 C)  SpO2: 97%   Filed Weights   11/12/21 1057  Weight: 119 lb 9.6 oz (54.3 kg)    BREAST: No palpable masses or nodules in either right or left breasts. No palpable axillary supraclavicular or infraclavicular adenopathy no breast tenderness or nipple discharge. (exam performed in the presence of a chaperone)  LABORATORY DATA:  I have reviewed the data as listed CMP Latest Ref Rng & Units 11/12/2021 02/09/2021 01/24/2020  Glucose 70 - 99 mg/dL 81 139(H) 118(H)  BUN 8 - 23 mg/dL 14 17 13   Creatinine 0.44 - 1.00 mg/dL 0.78 0.85 0.86  Sodium 135 - 145 mmol/L 139 139 140  Potassium 3.5 - 5.1 mmol/L 4.4 5.0 4.1  Chloride 98 - 111 mmol/L 106 104 104  CO2 22 - 32 mmol/L 29 27 26   Calcium 8.9 - 10.3 mg/dL 9.4 9.4 9.4  Total Protein 6.5 - 8.1 g/dL 6.8 6.7 7.0  Total Bilirubin 0.3 - 1.2 mg/dL  0.6 0.6 0.6  Alkaline Phos 38 - 126 U/L 70 59 69  AST 15 - 41 U/L 18 18 18   ALT 0 - 44 U/L 13 11 15     Lab Results  Component Value Date   WBC 4.0 11/12/2021   HGB 13.1 11/12/2021   HCT 38.6 11/12/2021   MCV 94.1 11/12/2021   PLT 237 11/12/2021   NEUTROABS 2.3 11/12/2021    ASSESSMENT & PLAN:  Malignant neoplasm of upper-outer quadrant of left breast in female, estrogen receptor positive ([place]) 08/20/2016:Left breast, needle core biopsy, 2 o'clock, 8cmfn: IDC,  DCIS with necrosis, grade 2, ER+(95%), PR+(100%), Ki-67 15%, HER-2 negative (ratio 0.78). Left breast, needle core biopsy, 2 o'clock, 5cmfn: IDC, grade 2, ER+(95%), PR+(100%), Ki-67 10%, HER-2 negative (ratio 1.06).    11/09/2016:Left breast lumpectomy Ninfa Linden): IDC with calcifications, Grade II/III, two foci, 3cm and 0.6 cm, DCIS with calcifications, +lymphovascular invasion, 1 SLN negative, T2, N0 Oncotype DX score 25: 17% risk Adjuvant radiation completed 01/17/2017 ---------------------------------------------------------------------------------------------------------------------- Prior treatment: Anastrozole started 09/10/2016 switched to exemestane and then letrozole completed December 2022 Breast cancer surveillance: 1.  Breast exam 11/12/2021: Benign 2. Mammogram 03/05/2019: Benign breast density category C  Return to clinic on an as-needed basis.    No orders of the defined types were placed in this encounter.  The patient has a good understanding of the overall plan. she agrees with it. she will call with any problems that may develop before the next visit here.  Total time spent: 20 mins including face to face time and time spent for planning, charting and coordination of care  Rulon Eisenmenger, MD, MPH 11/12/2021  I, Thana Ates, am acting as scribe for Dr. Nicholas Lose.  I have reviewed the above documentation for accuracy and completeness, and I agree with the above.

## 2021-11-12 ENCOUNTER — Inpatient Hospital Stay: Payer: PPO | Attending: Hematology and Oncology | Admitting: Hematology and Oncology

## 2021-11-12 ENCOUNTER — Inpatient Hospital Stay: Payer: PPO

## 2021-11-12 ENCOUNTER — Other Ambulatory Visit: Payer: Self-pay

## 2021-11-12 DIAGNOSIS — Z923 Personal history of irradiation: Secondary | ICD-10-CM | POA: Insufficient documentation

## 2021-11-12 DIAGNOSIS — Z170 Estrogen receptor positive status [ER+]: Secondary | ICD-10-CM

## 2021-11-12 DIAGNOSIS — C50412 Malignant neoplasm of upper-outer quadrant of left female breast: Secondary | ICD-10-CM | POA: Insufficient documentation

## 2021-11-12 LAB — CBC WITH DIFFERENTIAL (CANCER CENTER ONLY)
Abs Immature Granulocytes: 0 10*3/uL (ref 0.00–0.07)
Basophils Absolute: 0 10*3/uL (ref 0.0–0.1)
Basophils Relative: 1 %
Eosinophils Absolute: 0.1 10*3/uL (ref 0.0–0.5)
Eosinophils Relative: 3 %
HCT: 38.6 % (ref 36.0–46.0)
Hemoglobin: 13.1 g/dL (ref 12.0–15.0)
Immature Granulocytes: 0 %
Lymphocytes Relative: 31 %
Lymphs Abs: 1.2 10*3/uL (ref 0.7–4.0)
MCH: 32 pg (ref 26.0–34.0)
MCHC: 33.9 g/dL (ref 30.0–36.0)
MCV: 94.1 fL (ref 80.0–100.0)
Monocytes Absolute: 0.3 10*3/uL (ref 0.1–1.0)
Monocytes Relative: 7 %
Neutro Abs: 2.3 10*3/uL (ref 1.7–7.7)
Neutrophils Relative %: 58 %
Platelet Count: 237 10*3/uL (ref 150–400)
RBC: 4.1 MIL/uL (ref 3.87–5.11)
RDW: 12.6 % (ref 11.5–15.5)
WBC Count: 4 10*3/uL (ref 4.0–10.5)
nRBC: 0 % (ref 0.0–0.2)

## 2021-11-12 LAB — CMP (CANCER CENTER ONLY)
ALT: 13 U/L (ref 0–44)
AST: 18 U/L (ref 15–41)
Albumin: 4.3 g/dL (ref 3.5–5.0)
Alkaline Phosphatase: 70 U/L (ref 38–126)
Anion gap: 4 — ABNORMAL LOW (ref 5–15)
BUN: 14 mg/dL (ref 8–23)
CO2: 29 mmol/L (ref 22–32)
Calcium: 9.4 mg/dL (ref 8.9–10.3)
Chloride: 106 mmol/L (ref 98–111)
Creatinine: 0.78 mg/dL (ref 0.44–1.00)
GFR, Estimated: >60 mL/min (ref >60)
Glucose, Bld: 81 mg/dL (ref 70–99)
Potassium: 4.4 mmol/L (ref 3.5–5.1)
Sodium: 139 mmol/L (ref 135–145)
Total Bilirubin: 0.6 mg/dL (ref 0.3–1.2)
Total Protein: 6.8 g/dL (ref 6.5–8.1)

## 2021-11-12 NOTE — Assessment & Plan Note (Signed)
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 Left breast, needle core biopsy, 2 o'clock, 5cmfn: IDC, grade 2, ER+(95%), PR+(100%), Ki-67 10%, HER-2 negative (ratio 1.06).    11/09/2016:Left breast lumpectomy Kathy Bishop): IDC with calcifications, Grade II/III, two foci, 3cm and 0.6 cm, DCIS with calcifications, +lymphovascular invasion, 1 SLN negative, T2, N0 Oncotype DX score 25: 17% risk Adjuvant radiation completed 01/17/2017 ---------------------------------------------------------------------------------------------------------------------- Current treatment: Anastrozole started 09/10/2016 Anastrozole toxicities:  Breast cancer surveillance: 1.  Breast exam 11/12/2021: Benign 2. Mammogram 03/05/2019: Benign breast density category C  Return to clinic in 1 year for follow-up

## 2022-02-01 ENCOUNTER — Telehealth: Payer: Self-pay | Admitting: Family Medicine

## 2022-02-01 DIAGNOSIS — F325 Major depressive disorder, single episode, in full remission: Secondary | ICD-10-CM

## 2022-02-01 MED ORDER — ESCITALOPRAM OXALATE 20 MG PO TABS: 20.0000 mg | ORAL_TABLET | Freq: Every day | ORAL | 0 refills | Status: DC

## 2022-02-01 NOTE — Telephone Encounter (Signed)
30 day refill sent in and mychart sent to pt advising that appt is needed.  ?

## 2022-02-01 NOTE — Telephone Encounter (Signed)
Medication: escitalopram 20 MG ? ?  ? ?Has the patient contacted their pharmacy? Yes.   ?(If no, request that the patient contact the pharmacy for the refill.) ?(If yes, when and what did the pharmacy advise?) ? ?  ? ?Preferred Pharmacy (with phone number or street name):  ? ?[facility] ?[address] , [place] [address] ?[phone] ? ? ?

## 2022-02-02 NOTE — Telephone Encounter (Signed)
Opened in error

## 2022-02-15 ENCOUNTER — Ambulatory Visit (INDEPENDENT_AMBULATORY_CARE_PROVIDER_SITE_OTHER): Payer: PPO | Admitting: Family Medicine

## 2022-02-15 VITALS — BP 130/78 | HR 73 | Temp 98.0°F | Ht 64.0 in | Wt 122.4 lb

## 2022-02-15 DIAGNOSIS — F330 Major depressive disorder, recurrent, mild: Secondary | ICD-10-CM

## 2022-02-15 DIAGNOSIS — F411 Generalized anxiety disorder: Secondary | ICD-10-CM

## 2022-02-15 DIAGNOSIS — F325 Major depressive disorder, single episode, in full remission: Secondary | ICD-10-CM | POA: Diagnosis not present

## 2022-02-15 MED ORDER — ESCITALOPRAM OXALATE 20 MG PO TABS: 20.0000 mg | ORAL_TABLET | Freq: Every day | ORAL | 1 refills | Status: DC

## 2022-02-15 MED ORDER — BUSPIRONE HCL 5 MG PO TABS: 5.0000 mg | ORAL_TABLET | Freq: Two times a day (BID) | ORAL | 1 refills | Status: DC

## 2022-02-15 NOTE — Assessment & Plan Note (Signed)
See depression plan

## 2022-02-15 NOTE — Patient Instructions (Signed)
Start Buspar 5 mg ? ?Days 1-4: Take 5 mg twice daily ?Day 5-10: Take 10 mg AM and 5 mg PM ?Day 15 onward: Take 10 mg twice daily ? ? ?

## 2022-02-15 NOTE — Progress Notes (Signed)
? ?Subjective:  ? ?  ?Kathy Bishop is a 71 y.o. female presenting for Follow-up (Depression ) and Medication Refill ?  ? ? ?Medication Refill ? ? ?#depression/anxiety ?- up and down ?- more worrying that before ?- worrying about things she cannot control ?- feels the medication is not helping as much as it use to  ?- every morning waking up feeling anxious and not sure why ?- previous medications: prozac ?- has been in therapy off and on but not interested in this at this time ? ?Review of Systems ? ? ?Social History  ? ?Tobacco Use  ?Smoking Status Former  ?Smokeless Tobacco Never  ?Tobacco Comments  ? smoked years ago in her 20's socially  ? ? ? ?   ?Objective:  ?  ?BP Readings from Last 3 Encounters:  ?02/15/22 130/78  ?11/12/21 (!) 145/94  ?09/23/21 (!) 138/92  ? ?Wt Readings from Last 3 Encounters:  ?02/15/22 122 lb 6 oz (55.5 kg)  ?11/12/21 119 lb 9.6 oz (54.3 kg)  ?09/15/21 125 lb (56.7 kg)  ? ? ?BP 130/78   Pulse 73   Temp 98 ?F (36.7 ?C) (Oral)   Ht '5\' 4"'$  (1.626 m)   Wt 122 lb 6 oz (55.5 kg)   SpO2 97%   BMI 21.01 kg/m?  ? ? ?Physical Exam ?Constitutional:   ?   General: She is not in acute distress. ?   Appearance: She is well-developed. She is not diaphoretic.  ?HENT:  ?   Right Ear: External ear normal.  ?   Left Ear: External ear normal.  ?   Nose: Nose normal.  ?Eyes:  ?   Conjunctiva/sclera: Conjunctivae normal.  ?Cardiovascular:  ?   Rate and Rhythm: Normal rate.  ?Pulmonary:  ?   Effort: Pulmonary effort is normal.  ?Musculoskeletal:  ?   Cervical back: Neck supple.  ?Skin: ?   General: Skin is warm and dry.  ?   Capillary Refill: Capillary refill takes less than 2 seconds.  ?Neurological:  ?   Mental Status: She is alert. Mental status is at baseline.  ?Psychiatric:     ?   Mood and Affect: Mood normal.     ?   Behavior: Behavior normal.  ? ? ? ?  02/15/2022  ? 12:34 PM 02/25/2017  ?  3:34 PM 12/14/2016  ?  9:04 AM  ?Depression screen PHQ 2/9  ?Decreased Interest 1 0 0  ?Down, Depressed, Hopeless  1 0 0  ?PHQ - 2 Score 2 0 0  ?Altered sleeping 3    ?Tired, decreased energy 1    ?Change in appetite 1    ?Feeling bad or failure about yourself  0    ?Trouble concentrating 0    ?Moving slowly or fidgety/restless 0    ?Suicidal thoughts 0    ?PHQ-9 Score 7    ?Difficult doing work/chores Somewhat difficult    ? ? ?  02/15/2022  ? 12:34 PM  ?GAD 7 : Generalized Anxiety Score  ?Nervous, Anxious, on Edge 3  ?Control/stop worrying 2  ?Worry too much - different things 3  ?Trouble relaxing 2  ?Easily annoyed or irritable 1  ?Afraid - awful might happen 2  ?Anxiety Difficulty Somewhat difficult  ? ? ? ? ? ?   ?Assessment & Plan:  ? ?Problem List Items Addressed This Visit   ? ?  ? Other  ? Major depressive disorder, recurrent ([place])  ?  Some worsening symptoms despite maximum dose of  Lexapro.  Discussed options including therapy or alternative SSRI, but patient prefer to start trial of BuSpar.  Start BuSpar 5 mg twice daily okay to increase up to 10 mg twice daily before next office visit.  New Lexapro 20 mg ? ?  ?  ? Relevant Medications  ? escitalopram (LEXAPRO) 20 MG tablet  ? busPIRone (BUSPAR) 5 MG tablet  ? GAD (generalized anxiety disorder) - Primary  ?  See depression plan. ? ?  ?  ? Relevant Medications  ? escitalopram (LEXAPRO) 20 MG tablet  ? busPIRone (BUSPAR) 5 MG tablet  ? ? ? ?Return in about 6 weeks (around 03/29/2022) for wellness visit. ? ?Lesleigh Noe, MD ? ? ? ?

## 2022-02-15 NOTE — Assessment & Plan Note (Signed)
Some worsening symptoms despite maximum dose of Lexapro.  Discussed options including therapy or alternative SSRI, but patient prefer to start trial of BuSpar.  Start BuSpar 5 mg twice daily okay to increase up to 10 mg twice daily before next office visit.  New Lexapro 20 mg ?

## 2022-03-10 ENCOUNTER — Other Ambulatory Visit: Payer: Self-pay | Admitting: Family Medicine

## 2022-03-10 DIAGNOSIS — F325 Major depressive disorder, single episode, in full remission: Secondary | ICD-10-CM

## 2022-03-10 DIAGNOSIS — F411 Generalized anxiety disorder: Secondary | ICD-10-CM

## 2022-03-10 NOTE — Telephone Encounter (Signed)
Mychart message sent to confirm dosage pt is taking.

## 2022-03-22 MED ORDER — BUSPIRONE HCL 10 MG PO TABS: 10.0000 mg | ORAL_TABLET | Freq: Two times a day (BID) | ORAL | 0 refills | Status: DC

## 2022-03-29 ENCOUNTER — Ambulatory Visit (INDEPENDENT_AMBULATORY_CARE_PROVIDER_SITE_OTHER): Payer: PPO | Admitting: Family Medicine

## 2022-03-29 VITALS — BP 122/80 | HR 69 | Temp 97.8°F | Ht 63.25 in | Wt 121.1 lb

## 2022-03-29 DIAGNOSIS — F411 Generalized anxiety disorder: Secondary | ICD-10-CM | POA: Diagnosis not present

## 2022-03-29 DIAGNOSIS — Z0000 Encounter for general adult medical examination without abnormal findings: Secondary | ICD-10-CM | POA: Diagnosis not present

## 2022-03-29 DIAGNOSIS — Z13220 Encounter for screening for lipoid disorders: Secondary | ICD-10-CM | POA: Diagnosis not present

## 2022-03-29 LAB — LIPID PANEL
Cholesterol: 197 mg/dL (ref 0–200)
HDL: 98.2 mg/dL (ref >39.00)
LDL Cholesterol: 87 mg/dL (ref 0–99)
NonHDL: 99.04
Total CHOL/HDL Ratio: 2
Triglycerides: 58 mg/dL (ref 0.0–149.0)
VLDL: 11.6 mg/dL (ref 0.0–40.0)

## 2022-03-29 NOTE — Patient Instructions (Addendum)
Book "Set Boundaries, Find Peace"   Consider pneumonia  Tdap and Shingles at the pharmacy   Buspar - decrease to 10 mg daily - if withdrawal symptoms (or new symptoms) continue until those resolve - if no symptoms - then can try stopping after 1 week - go back to previous or do in between dose if withdrawal symptoms  Update me with anxiety symptoms and or sleep issues in 1-2 weeks after stopping Buspar  Bupropion Extended-Release Tablets (Depression/Mood Disorders) What is this medication? BUPROPION (byoo PROE pee on) treats depression. It increases norepinephrine and dopamine in the brain, hormones that help regulate mood. It belongs to a group of medications called NDRIs. This medicine may be used for other purposes; ask your health care provider or pharmacist if you have questions. COMMON BRAND NAME(S): Aplenzin, Budeprion XL, Forfivo XL, Wellbutrin XL What should I tell my care team before I take this medication? They need to know if you have any of these conditions: An eating disorder, such as anorexia or bulimia Bipolar disorder or psychosis Diabetes or high blood sugar, treated with medication Glaucoma Head injury or brain tumor Heart disease, previous heart attack, or irregular heart beat High blood pressure Kidney or liver disease Seizures (convulsions) Suicidal thoughts or a previous suicide attempt Tourette's syndrome Weight loss An unusual or allergic reaction to bupropion, other medications, foods, dyes, or preservatives Pregnant or trying to become pregnant Breast-feeding How should I use this medication? Take this medication by mouth with a glass of water. Follow the directions on the prescription label. You can take it with or without food. If it upsets your stomach, take it with food. Do not crush, chew, or cut these tablets. This medication is taken once daily at the same time each day. Do not take your medication more often than directed. Do not stop taking  this medication suddenly except upon the advice of your care team. Stopping this medication too quickly may cause serious side effects or your condition may worsen. A special MedGuide will be given to you by the pharmacist with each prescription and refill. Be sure to read this information carefully each time. Talk to your care team about the use of this medication in children. Special care may be needed. Overdosage: If you think you have taken too much of this medicine contact a poison control center or emergency room at once. NOTE: This medicine is only for you. Do not share this medicine with others. What if I miss a dose? If you miss a dose, skip the missed dose and take your next tablet at the regular time. Do not take double or extra doses. What may interact with this medication? Do not take this medication with any of the following: Linezolid MAOIs like Azilect, Carbex, Eldepryl, Marplan, Nardil, and Parnate Methylene blue (injected into a vein) Other medications that contain bupropion like Zyban This medication may also interact with the following: Alcohol Certain medications for anxiety or sleep Certain medications for blood pressure like metoprolol, propranolol Certain medications for depression or psychotic disturbances Certain medications for HIV or AIDS like efavirenz, lopinavir, nelfinavir, ritonavir Certain medications for irregular heart beat like propafenone, flecainide Certain medications for Parkinson's disease like amantadine, levodopa Certain medications for seizures like carbamazepine, phenytoin, phenobarbital Cimetidine Clopidogrel Cyclophosphamide Digoxin Furazolidone Isoniazid Nicotine Orphenadrine Procarbazine Steroid medications like prednisone or cortisone Stimulant medications for attention disorders, weight loss, or to stay awake Tamoxifen Theophylline Thiotepa Ticlopidine Tramadol Warfarin This list may not describe all possible interactions. Give  your health care provider a list of all the medicines, herbs, non-prescription drugs, or dietary supplements you use. Also tell them if you smoke, drink alcohol, or use illegal drugs. Some items may interact with your medicine. What should I watch for while using this medication? Tell your care team if your symptoms do not get better or if they get worse. Visit your care team for regular checks on your progress. Because it may take several weeks to see the full effects of this medication, it is important to continue your treatment as prescribed. Watch for new or worsening thoughts of suicide or depression. This includes sudden changes in mood, behavior, or thoughts. These changes can happen at any time but are more common in the beginning of treatment or after a change in dose. Call your care team right away if you experience these thoughts or worsening depression. Manic episodes may happen in patients with bipolar disorder who take this medication. Watch for changes in feelings or behaviors such as feeling anxious, nervous, agitated, panicky, irritable, hostile, aggressive, impulsive, severely restless, overly excited and hyperactive, or trouble sleeping. These symptoms can happen at anytime but are more common in the beginning of treatment or after a change in dose. Call your care team right away if you notice any of these symptoms. This medication may cause serious skin reactions. They can happen weeks to months after starting the medication. Contact your care team right away if you notice fevers or flu-like symptoms with a rash. The rash may be red or purple and then turn into blisters or peeling of the skin. Or, you might notice a red rash with swelling of the face, lips or lymph nodes in your neck or under your arms. Avoid drinks that contain alcohol while taking this medication. Drinking large amounts of alcohol, using sleeping or anxiety medications, or quickly stopping the use of these agents while  taking this medication may increase your risk for a seizure. Do not drive or use heavy machinery until you know how this medication affects you. This medication can impair your ability to perform these tasks. Do not take this medication close to bedtime. It may prevent you from sleeping. Your mouth may get dry. Chewing sugarless gum or sucking hard candy, and drinking plenty of water may help. Contact your care team if the problem does not go away or is severe. The tablet shell for some brands of this medication does not dissolve. This is normal. The tablet shell may appear whole in the stool. This is not a cause for concern. What side effects may I notice from receiving this medication? Side effects that you should report to your care team as soon as possible: Allergic reactions--skin rash, itching, hives, swelling of the face, lips, tongue, or throat Increase in blood pressure Mood and behavior changes--anxiety, nervousness, confusion, hallucinations, irritability, hostility, thoughts of suicide or self-harm, worsening mood, feelings of depression Redness, blistering, peeling, or loosening of the skin, including inside the mouth Seizures Sudden eye pain or change in vision such as blurry vision, seeing halos around lights, vision loss Side effects that usually do not require medical attention (report to your care team if they continue or are bothersome): Constipation Dizziness Dry mouth Loss of appetite Nausea Tremors or shaking Trouble sleeping This list may not describe all possible side effects. Call your doctor for medical advice about side effects. You may report side effects to FDA at 1-800-FDA-1088. Where should I keep my medication? Keep out of the reach  of children and pets. Store at room temperature between 15 and 30 degrees C (59 and 86 degrees F). Throw away any unused medication after the expiration date. NOTE: This sheet is a summary. It may not cover all possible  information. If you have questions about this medicine, talk to your doctor, pharmacist, or health care provider.  2023 Elsevier/Gold Standard (2020-12-10 00:00:00)

## 2022-03-29 NOTE — Assessment & Plan Note (Signed)
Symptoms improved, but side effects to BuSpar.  Discussed tapering BuSpar, update if anxiety symptoms worsen.  Encouraged reading set boundaries find peace.  Also encouraged therapy, she will consider.  Information about Wellbutrin provided in case symptoms worsen with stopping BuSpar.  Continue Lexapro 20 mg daily.

## 2022-03-29 NOTE — Progress Notes (Signed)
Subjective:   Kathy Bishop is a 71 y.o. female who presents for Medicare Annual (Subsequent) preventive examination.  Review of Systems    Review of Systems  Constitutional:  Negative for chills and fever.  HENT:  Negative for congestion and sore throat.   Eyes:  Negative for blurred vision and double vision.  Respiratory:  Negative for shortness of breath.   Cardiovascular:  Negative for chest pain.  Gastrointestinal:  Positive for nausea. Negative for heartburn and vomiting.  Genitourinary: Negative.   Musculoskeletal: Negative.  Negative for myalgias.  Skin:  Negative for rash.  Neurological:  Negative for dizziness and headaches.  Endo/Heme/Allergies:  Does not bruise/bleed easily.  Psychiatric/Behavioral:  Negative for depression. The patient has insomnia. The patient is not nervous/anxious.     Cardiac Risk Factors include: advanced age (>63mn, >>36women)     Objective:    Today's Vitals   03/29/22 1048  BP: 122/80  Pulse: 69  Temp: 97.8 F (36.6 C)  TempSrc: Temporal  SpO2: 99%  Weight: 121 lb 2 oz (54.9 kg)  Height: 5' 3.25" (1.607 m)   Body mass index is 21.29 kg/m.     03/29/2022   11:05 AM 11/28/2019   11:57 AM 02/25/2017    3:34 PM 12/14/2016    9:04 AM 11/09/2016    6:42 AM 11/03/2016    2:14 PM 09/10/2016    3:03 PM  Advanced Directives  Does Patient Have a Medical Advance Directive? Yes No Yes Yes Yes No No  Type of Advance Directive Living will  [place]     Does patient want to make changes to medical advance directive? No - Patient declined    No - Patient declined    Copy of [place] Chart?   Yes Yes     Would patient like information on creating a medical advance directive?  No - Patient declined   No - Patient declined      Current Medications (verified) Outpatient Encounter Medications as of 03/29/2022  Medication Sig   busPIRone (BUSPAR) 10 MG  tablet Take 1 tablet (10 mg total) by mouth 2 (two) times daily.   escitalopram (LEXAPRO) 20 MG tablet Take 1 tablet (20 mg total) by mouth daily.   No facility-administered encounter medications on file as of 03/29/2022.    Allergies (verified) Patient has no known allergies.   History: Past Medical History:  Diagnosis Date   Anxiety    Breast cancer ([place]    Depression    Dermatitis    allergic   Foot pain, right    History of radiation therapy 12/20/16- 01/17/17   Left Breast 40.05 Gy in 15 fractions and a 10 Gy boost in 5 fractions.    Malignant neoplasm of upper-outer quadrant of left breast in female, estrogen receptor positive ([place] 08/31/2016   Need for prophylactic hormone replacement therapy (postmenopausal)    Neuromuscular disorder ([place]    Nocturia    Personal history of radiation therapy    Routine general medical examination at a health care facility    Symptomatic menopausal or female climacteric states    Past Surgical History:  Procedure Laterality Date   BREAST LUMPECTOMY Left    BREAST LUMPECTOMY WITH SENTINEL LYMPH NODE BIOPSY Left 11/09/2016   Procedure: LEFT BREAST LUMPECTOMY WITH SENTINEL LYMPH NODE BX;  Surgeon: DCoralie Keens MD;  [facility] [facility]  Service: General;  Laterality: Left;  TONSILLECTOMY     age 51   Family History  Problem Relation Age of Onset   Alzheimer's disease Mother    Lung cancer Father        smoker   Colon cancer Neg Hx    Esophageal cancer Neg Hx    Rectal cancer Neg Hx    Stomach cancer Neg Hx    Social History   Socioeconomic History   Marital status: Married    Spouse name: Not on file   Number of children: 0   Years of education: Not on file   Highest education level: Not on file  Occupational History    Employer: [employer]    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  Tobacco Use   Smoking status: Former   Smokeless tobacco: Never   Tobacco comments:    smoked years ago  in her 20's socially  Substance and Sexual Activity   Alcohol use: Yes    Comment: social   Drug use: No   Sexual activity: Not on file  Other Topics Concern   Not on file  Social History Narrative   Not on file   Social Determinants of Health   Financial Resource Strain: Not on file  Food Insecurity: Not on file  Transportation Needs: Not on file  Physical Activity: Not on file  Stress: Not on file  Social Connections: Not on file    Tobacco Counseling Counseling given: Not Answered Tobacco comments: smoked years ago in her 20's socially   Clinical Intake:  Pre-visit preparation completed: No  Pain : No/denies pain     BMI - recorded: 21.29 Nutritional Status: BMI of 19-24  Normal Nutritional Risks: None Diabetes: No  How often do you need to have someone help you when you read instructions, pamphlets, or other written materials from your doctor or pharmacy?: 1 - Never What is the last grade level you completed in school?: college  Diabetic?no  Interpreter Needed?: No      Activities of Daily Living    03/29/2022   11:07 AM  In your present state of health, do you have any difficulty performing the following activities:  Hearing? 0  Vision? 1  Comment going to get her vision changing  Difficulty concentrating or making decisions? 0  Walking or climbing stairs? 0  Dressing or bathing? 0  Doing errands, shopping? 0  Preparing Food and eating ? N  Using the Toilet? N  In the past six months, have you accidently leaked urine? Y  Do you have problems with loss of bowel control? N  Managing your Medications? N  Managing your Finances? N  Housekeeping or managing your Housekeeping? N    Patient Care Team: Lesleigh Noe, MD as PCP - General (Family Medicine) Coralie Keens, MD as Consulting Physician (General Surgery) Magrinat, Virgie Dad, MD (Inactive) as Consulting Physician (Oncology) Eppie Gibson, MD as Attending Physician (Radiation  Oncology) Delice Bison Charlestine Massed, NP as Nurse Practitioner (Hematology and Oncology)  Indicate any recent Medical Services you may have received from other than [facility] providers in the past year (date may be approximate).     Assessment:   This is a routine wellness examination for Kathy Bishop.  Hearing/Vision screen No results found.  Dietary issues and exercise activities discussed: Current Exercise Habits: Home exercise routine, Type of exercise: walking, Time (Minutes): 60, Frequency (Times/Week): 6, Weekly Exercise (Minutes/Week): 360, Intensity: Moderate, Exercise limited by: None identified   Goals Addressed  This Visit's Progress    Patient Stated       Manage mood, keep weight down      Depression Screen    03/29/2022   11:39 AM 02/15/2022   12:34 PM 02/25/2017    3:34 PM 12/14/2016    9:04 AM 09/07/2016    9:41 AM  PHQ 2/9 Scores  PHQ - 2 Score 2 2 0 0 0  PHQ- 9 Score 8 7       Fall Risk    03/29/2022   10:47 AM 02/15/2022   12:07 PM 11/13/2019    9:49 AM 08/30/2018   11:02 AM 02/25/2017    3:34 PM  [place] in the past year? 0 0 0 0 No  Number falls in past yr: 0 0     Risk for fall due to : No Fall Risks No Fall Risks     Follow up   Falls evaluation completed      FALL RISK PREVENTION PERTAINING TO THE HOME:  Any stairs in or around the home? Yes  If so, are there any without handrails? Yes  Home free of loose throw rugs in walkways, pet beds, electrical cords, etc? Yes  Adequate lighting in your home to reduce risk of falls? Yes   Cognitive Function:      Mini-Cog - 03/29/22 1108     Normal clock drawing test? yes    How many words correct? 2                Immunizations Immunization History  Administered Date(s) Administered   PFIZER(Purple Top)SARS-COV-2 Vaccination 11/24/2019, 12/19/2019, 07/25/2020   Td 10/12/2003    TDAP status: Due, Education has been provided regarding the importance of this vaccine. Advised  may receive this vaccine at local pharmacy or Health Dept. Aware to provide a copy of the vaccination record if obtained from local pharmacy or Health Dept. Verbalized acceptance and understanding.  Flu Vaccine status: Up to date  Pneumococcal vaccine status: Declined,  Education has been provided regarding the importance of this vaccine but patient still declined. Advised may receive this vaccine at local pharmacy or Health Dept. Aware to provide a copy of the vaccination record if obtained from local pharmacy or Health Dept. Verbalized acceptance and understanding.   Covid-19 vaccine status: Completed vaccines  Qualifies for Shingles Vaccine? Yes   Zostavax completed No   Shingrix Completed?: No.    Education has been provided regarding the importance of this vaccine. Patient has been advised to call insurance company to determine out of pocket expense if they have not yet received this vaccine. Advised may also receive vaccine at local pharmacy or Health Dept. Verbalized acceptance and understanding.  Screening Tests Health Maintenance  Topic Date Due   Zoster Vaccines- Shingrix (1 of 2) Never done   TETANUS/TDAP  10/11/2013   Pneumonia Vaccine 36+ Years old (1 - PCV) Never done   COVID-19 Vaccine (4 - Booster for Pfizer series) 09/19/2020   MAMMOGRAM  03/03/2023   COLONOSCOPY (Pts 45-83yr Insurance coverage will need to be confirmed)  11/28/2030   DEXA SCAN  Completed   Hepatitis C Screening  Completed   HPV VACCINES  Aged Out   INFLUENZA VACCINE  Discontinued    Health Maintenance  Health Maintenance Due  Topic Date Due   Zoster Vaccines- Shingrix (1 of 2) Never done   TETANUS/TDAP  10/11/2013   Pneumonia Vaccine 70 Years old (1 - PCV) Never done  COVID-19 Vaccine (4 - Booster for Pfizer series) 09/19/2020    Colorectal cancer screening: Type of screening: Colonoscopy. Completed 11/2020. Repeat every 2032 years  Mammogram status: Completed 02/2021. Repeat every  year  Bone Density status: Completed 2018. Results reflect: Bone density results: NORMAL. Repeat every 5 years.  Lung Cancer Screening: (Low Dose CT Chest recommended if Age 2-80 years, 30 pack-year currently smoking OR have quit w/in 15years.) does not qualify.   Lung Cancer Screening Referral: n/a  Additional Screening:  Hepatitis C Screening: does qualify; Completed   Vision Screening: Recommended annual ophthalmology exams for early detection of glaucoma and other disorders of the eye. Is the patient up to date with their annual eye exam?  Yes    Dental Screening: Recommended annual dental exams for proper oral hygiene  Community Resource Referral / Chronic Care Management: CRR required this visit?  No   CCM required this visit?  No       03/29/2022   11:39 AM 02/15/2022   12:34 PM 02/25/2017    3:34 PM  Depression screen PHQ 2/9  Decreased Interest 1 1 0  Down, Depressed, Hopeless 1 1 0  PHQ - 2 Score 2 2 0  Altered sleeping 3 3   Tired, decreased energy 2 1   Change in appetite 0 1   Feeling bad or failure about yourself  0 0   Trouble concentrating 1 0   Moving slowly or fidgety/restless 0 0   Suicidal thoughts 0 0   PHQ-9 Score 8 7   Difficult doing work/chores Somewhat difficult Somewhat difficult       03/29/2022   11:39 AM 02/15/2022   12:34 PM  GAD 7 : Generalized Anxiety Score  Nervous, Anxious, on Edge 1 3  Control/stop worrying 1 2  Worry too much - different things 1 3  Trouble relaxing 1 2  Restless 1   Easily annoyed or irritable 1 1  Afraid - awful might happen 1 2  Total GAD 7 Score 7   Anxiety Difficulty Somewhat difficult Somewhat difficult        Plan:     I have personally reviewed and noted the following in the patient's chart:   Medical and social history Use of alcohol, tobacco or illicit drugs  Current medications and supplements including opioid prescriptions.  Functional ability and status Nutritional status Physical  activity Advanced directives List of other physicians Hospitalizations, surgeries, and ER visits in previous 12 months Vitals Screenings to include cognitive, depression, and falls Referrals and appointments  In addition, I have reviewed and discussed with patient certain preventive protocols, quality metrics, and best practice recommendations. A written personalized care plan for preventive services as well as general preventive health recommendations were provided to patient.     Lesleigh Noe, MD   03/29/2022   Problem List Items Addressed This Visit       Other   GAD (generalized anxiety disorder)    Symptoms improved, but side effects to BuSpar.  Discussed tapering BuSpar, update if anxiety symptoms worsen.  Encouraged reading set boundaries find peace.  Also encouraged therapy, she will consider.  Information about Wellbutrin provided in case symptoms worsen with stopping BuSpar.  Continue Lexapro 20 mg daily.      Other Visit Diagnoses     Encounter for Medicare annual wellness exam    -  Primary   Screening for hyperlipidemia       Relevant Orders   Lipid panel

## 2022-03-30 ENCOUNTER — Other Ambulatory Visit: Payer: Self-pay | Admitting: Family Medicine

## 2022-03-30 DIAGNOSIS — F411 Generalized anxiety disorder: Secondary | ICD-10-CM

## 2022-03-30 DIAGNOSIS — F325 Major depressive disorder, single episode, in full remission: Secondary | ICD-10-CM

## 2022-06-07 ENCOUNTER — Other Ambulatory Visit: Payer: Self-pay | Admitting: Family Medicine

## 2022-06-07 DIAGNOSIS — Z1231 Encounter for screening mammogram for malignant neoplasm of breast: Secondary | ICD-10-CM

## 2022-06-07 DIAGNOSIS — H40033 Anatomical narrow angle, bilateral: Secondary | ICD-10-CM | POA: Diagnosis not present

## 2022-06-07 DIAGNOSIS — H2513 Age-related nuclear cataract, bilateral: Secondary | ICD-10-CM | POA: Diagnosis not present

## 2022-06-18 ENCOUNTER — Other Ambulatory Visit: Payer: Self-pay | Admitting: Family Medicine

## 2022-06-18 DIAGNOSIS — F411 Generalized anxiety disorder: Secondary | ICD-10-CM

## 2022-06-18 DIAGNOSIS — F325 Major depressive disorder, single episode, in full remission: Secondary | ICD-10-CM

## 2022-06-18 NOTE — Telephone Encounter (Signed)
Mychart message sent.

## 2022-06-25 ENCOUNTER — Ambulatory Visit: Payer: PPO

## 2022-07-28 ENCOUNTER — Ambulatory Visit
Admission: RE | Admit: 2022-07-28 | Discharge: 2022-07-28 | Disposition: A | Discharge status: Home / Self Care | Payer: PPO | Source: Ambulatory Visit | Attending: Family Medicine | Admitting: Family Medicine

## 2022-07-28 DIAGNOSIS — Z1231 Encounter for screening mammogram for malignant neoplasm of breast: Secondary | ICD-10-CM

## 2022-08-10 ENCOUNTER — Encounter: Payer: Self-pay | Admitting: Physician Assistant

## 2022-08-10 DIAGNOSIS — H35372 Puckering of macula, left eye: Secondary | ICD-10-CM | POA: Diagnosis not present

## 2022-08-10 DIAGNOSIS — H2513 Age-related nuclear cataract, bilateral: Secondary | ICD-10-CM | POA: Diagnosis not present

## 2022-08-10 DIAGNOSIS — H2512 Age-related nuclear cataract, left eye: Secondary | ICD-10-CM | POA: Diagnosis not present

## 2022-08-10 DIAGNOSIS — H25013 Cortical age-related cataract, bilateral: Secondary | ICD-10-CM | POA: Diagnosis not present

## 2022-08-10 DIAGNOSIS — H18413 Arcus senilis, bilateral: Secondary | ICD-10-CM | POA: Diagnosis not present

## 2022-08-16 ENCOUNTER — Telehealth: Payer: Self-pay | Admitting: Family Medicine

## 2022-08-16 DIAGNOSIS — F325 Major depressive disorder, single episode, in full remission: Secondary | ICD-10-CM

## 2022-08-16 DIAGNOSIS — F411 Generalized anxiety disorder: Secondary | ICD-10-CM

## 2022-08-16 NOTE — Telephone Encounter (Signed)
Patient called and stated she wants to up the dosage on medication escitalopram (LEXAPRO) 20 MG tablet to '30MG'$  and she also stated she is out of the medication. Call back number [phone].

## 2022-08-17 MED ORDER — ESCITALOPRAM OXALATE 20 MG PO TABS: 20.0000 mg | ORAL_TABLET | Freq: Every day | ORAL | 0 refills | Status: DC

## 2022-08-17 NOTE — Telephone Encounter (Signed)
Pt notified Rx sent and advised of Dr. Tower's comments  

## 2022-08-17 NOTE — Telephone Encounter (Signed)
Needs a follow up  Preferably with whoever she is moving care to but if that is not an option then first available  I can refill until that appt if needed

## 2022-08-17 NOTE — Telephone Encounter (Signed)
Will send to Dr Glori Bickers for review. Patient does not have TOC set up with anyone at this time.

## 2022-08-17 NOTE — Telephone Encounter (Signed)
Patient has been scheduled for a TOC appointment with Kathy Bishop on 08/27/2022. She was wondering if the medication could be refilled at '30MG'$  instead of '20MG'$  when the refill is sent in.

## 2022-08-17 NOTE — Telephone Encounter (Signed)
I sent in the lexapro at 20  This is the max recommended dose that I am aware of Thanks

## 2022-08-27 ENCOUNTER — Ambulatory Visit (INDEPENDENT_AMBULATORY_CARE_PROVIDER_SITE_OTHER): Payer: PPO | Admitting: Family

## 2022-08-27 ENCOUNTER — Encounter: Payer: Self-pay | Admitting: Family

## 2022-08-27 VITALS — BP 128/82 | HR 72 | Temp 98.2°F | Resp 16 | Ht 63.25 in | Wt 127.4 lb

## 2022-08-27 DIAGNOSIS — H8110 Benign paroxysmal vertigo, unspecified ear: Secondary | ICD-10-CM

## 2022-08-27 DIAGNOSIS — Z853 Personal history of malignant neoplasm of breast: Secondary | ICD-10-CM | POA: Diagnosis not present

## 2022-08-27 DIAGNOSIS — F325 Major depressive disorder, single episode, in full remission: Secondary | ICD-10-CM

## 2022-08-27 DIAGNOSIS — F411 Generalized anxiety disorder: Secondary | ICD-10-CM

## 2022-08-27 MED ORDER — ESCITALOPRAM OXALATE 20 MG PO TABS: ORAL_TABLET | ORAL | 1 refills | Status: DC

## 2022-08-27 MED ORDER — ESCITALOPRAM OXALATE 10 MG PO TABS: ORAL_TABLET | ORAL | 1 refills | Status: DC

## 2022-08-27 NOTE — Assessment & Plan Note (Signed)
Five years in remission, stable.

## 2022-08-27 NOTE — Assessment & Plan Note (Signed)
Ok to increase to 30 mg lexapro once daily.  Pt to f/u if any concerns or s/e

## 2022-08-27 NOTE — Progress Notes (Signed)
Established Patient Office Visit  Subjective:  Patient ID: Kathy Bishop, female    DOB: 04/05/1951  Age: 71 y.o. MRN: [id number]  CC:  Chief Complaint  Patient presents with   Transitions Of Care    HPI Kathy Bishop is here for a transition of care visit.  Prior provider was: Dr. Waunita Schooner  Pt is without acute concerns.   Anxiety/depression: lexapro 20 mg once daily, used to be on 30 mg once daily and was dropped to 20 mg lexapro by prior pcp, which she states doesn't feel as it is as effective currently. Feels much better on 30 mg.   Past Medical History:  Diagnosis Date   Anxiety    Breast cancer (Kathy Bishop)    Depression    Dermatitis    allergic   Foot pain, right    History of radiation therapy 12/20/16- 01/17/17   Left Breast 40.05 Gy in 15 fractions and a 10 Gy boost in 5 fractions.    Malignant neoplasm of upper-outer quadrant of left breast in female, estrogen receptor positive ([place]) 08/31/2016   Need for prophylactic hormone replacement therapy (postmenopausal)    Neuromuscular disorder ([facility])    Nocturia    Personal history of radiation therapy    Routine general medical examination at a health care facility    Symptomatic menopausal or female climacteric states     Past Surgical History:  Procedure Laterality Date   BREAST LUMPECTOMY Left    BREAST LUMPECTOMY WITH SENTINEL LYMPH NODE BIOPSY Left 11/09/2016   Procedure: LEFT BREAST LUMPECTOMY WITH SENTINEL LYMPH NODE BX;  Surgeon: Coralie Keens, MD;  Location: [place];  Service: General;  Laterality: Left;   TONSILLECTOMY     age 25    Family History  Problem Relation Age of Onset   Alzheimer's disease Mother    Lung cancer Father        smoker   Colon cancer Neg Hx    Esophageal cancer Neg Hx    Rectal cancer Neg Hx    Stomach cancer Neg Hx     Social History   Socioeconomic History   Marital status: Married    Spouse name: Not on file   Number of children: 0   Years of  education: Not on file   Highest education level: Not on file  Occupational History   Occupation: retired  Tobacco Use   Smoking status: Former   Smokeless tobacco: Never   Tobacco comments:    smoked years ago in her 20's socially  Vaping Use   Vaping Use: Never used  Substance and Sexual Activity   Alcohol use: Yes    Comment: social   Drug use: No   Sexual activity: Not Currently  Other Topics Concern   Not on file  Social History Narrative   Not on file   Social Determinants of Health   Financial Resource Strain: Not on file  Food Insecurity: Not on file  Transportation Needs: Not on file  Physical Activity: Not on file  Stress: Not on file  Social Connections: Not on file  Intimate Partner Violence: Not on file    Outpatient Medications Prior to Visit  Medication Sig Dispense Refill   escitalopram (LEXAPRO) 20 MG tablet Take 1 tablet (20 mg total) by mouth daily. 90 tablet 0   busPIRone (BUSPAR) 10 MG tablet Take 1 tablet (10 mg total) by mouth 2 (two) times daily. 180 tablet 0   No facility-administered medications prior to  visit.    Allergies  Allergen Reactions   Buspirone Nausea Only    ROS Review of Systems  Review of Systems  Respiratory:  Negative for shortness of breath.   Cardiovascular:  Negative for chest pain and palpitations.  Gastrointestinal:  Negative for constipation and diarrhea.  Genitourinary:  Negative for dysuria, frequency and urgency.  Musculoskeletal:  Negative for myalgias.  Psychiatric/Behavioral:  Negative for depression and suicidal ideas.   All other systems reviewed and are negative.    Objective:    Physical Exam  Gen: NAD, resting comfortably CV: RRR with no murmurs appreciated Pulm: NWOB, CTAB with no crackles, wheezes, or rhonchi Skin: warm, dry Psych: Normal affect and thought content  BP 128/82   Pulse 72   Temp 98.2 F (36.8 C)   Resp 16   Ht 5' 3.25" (1.607 m)   Wt 127 lb 6 oz (57.8 kg)   SpO2 97%    BMI 22.39 kg/m  Wt Readings from Last 3 Encounters:  08/27/22 127 lb 6 oz (57.8 kg)  03/29/22 121 lb 2 oz (54.9 kg)  02/15/22 122 lb 6 oz (55.5 kg)     There are no preventive care reminders to display for this patient.   There are no preventive care reminders to display for this patient.  Lab Results  Component Value Date   TSH 1.51 08/03/2016   Lab Results  Component Value Date   WBC 4.0 11/12/2021   HGB 13.1 11/12/2021   HCT 38.6 11/12/2021   MCV 94.1 11/12/2021   PLT 237 11/12/2021   Lab Results  Component Value Date   NA 139 11/12/2021   K 4.4 11/12/2021   CHLORIDE 104 10/12/2017   CO2 29 11/12/2021   GLUCOSE 81 11/12/2021   BUN 14 11/12/2021   CREATININE 0.78 11/12/2021   BILITOT 0.6 11/12/2021   ALKPHOS 70 11/12/2021   AST 18 11/12/2021   ALT 13 11/12/2021   PROT 6.8 11/12/2021   ALBUMIN 4.3 11/12/2021   CALCIUM 9.4 11/12/2021   ANIONGAP 4 (L) 11/12/2021   EGFR >60 10/12/2017   GFR 84.55 08/30/2018   Lab Results  Component Value Date   CHOL 197 03/29/2022   Lab Results  Component Value Date   HDL 98.20 03/29/2022   Lab Results  Component Value Date   LDLCALC 87 03/29/2022   Lab Results  Component Value Date   TRIG 58.0 03/29/2022   Lab Results  Component Value Date   CHOLHDL 2 03/29/2022   No results found for: "HGBA1C"    Assessment & Plan:   Problem List Items Addressed This Visit       Nervous and Auditory   BPV (benign positional vertigo)    stable        Other   GAD (generalized anxiety disorder)    Ok to increase to 30 mg lexapro once daily.  Pt to f/u if any concerns or s/e        Relevant Medications   escitalopram (LEXAPRO) 20 MG tablet   escitalopram (LEXAPRO) 10 MG tablet   History of left breast cancer - Primary    Five years in remission, stable.        Other Visit Diagnoses     Depression, major, single episode, complete remission ([place])       Relevant Medications   escitalopram (LEXAPRO) 20 MG  tablet   escitalopram (LEXAPRO) 10 MG tablet       Meds ordered this encounter  Medications  escitalopram (LEXAPRO) 20 MG tablet    Sig: Take one 20 mg tablet along with one 10 mg tablet for total of 30 mg once daily    Dispense:  90 tablet    Refill:  1    Order Specific Question:   Supervising Provider    Answer:   BEDSOLE, AMY E [[id number]]   escitalopram (LEXAPRO) 10 MG tablet    Sig: Take one 10 mg tablet along with one 20 mg tablet for total of 30 mg once daily    Dispense:  90 tablet    Refill:  1    Order Specific Question:   Supervising Provider    Answer:   BEDSOLE, AMY E [[id number]]    Follow-up: Return for f/u CPE.    Eugenia Pancoast, FNP

## 2022-08-27 NOTE — Patient Instructions (Signed)
Welcome to our clinic, I am happy to have you as my new patient. I am excited to continue on this healthcare journey with you.  Stop by the lab prior to leaving today. I will notify you of your results once received.   Please keep in mind Any my chart messages you send have up to a three business day turnaround for a response.  Phone calls may take up to a one full business day turnaround for a  response.   If you need a medication refill I recommend you request it through the pharmacy as this is easiest for [place] rather than sending a message and or phone call.   Due to recent changes in healthcare laws, you may see results of your imaging and/or laboratory studies on MyChart before I have had a chance to review them.  I understand that in some cases there may be results that are confusing or concerning to you. Please understand that not all results are received at the same time and often I may need to interpret multiple results in order to provide you with the best plan of care or course of treatment. Therefore, I ask that you please give me 2 business days to thoroughly review all your results before contacting my office for clarification. Should we see a critical lab result, you will be contacted sooner.   It was a pleasure seeing you today! Please do not hesitate to reach out with any questions and or concerns.  Regards,   Eugenia Pancoast FNP-C

## 2022-08-27 NOTE — Assessment & Plan Note (Signed)
stable °

## 2022-11-12 DIAGNOSIS — H2512 Age-related nuclear cataract, left eye: Secondary | ICD-10-CM | POA: Diagnosis not present

## 2022-11-12 DIAGNOSIS — H2513 Age-related nuclear cataract, bilateral: Secondary | ICD-10-CM | POA: Diagnosis not present

## 2022-11-12 DIAGNOSIS — H2511 Age-related nuclear cataract, right eye: Secondary | ICD-10-CM | POA: Diagnosis not present

## 2022-11-12 DIAGNOSIS — Z961 Presence of intraocular lens: Secondary | ICD-10-CM | POA: Diagnosis not present

## 2022-11-26 DIAGNOSIS — Z961 Presence of intraocular lens: Secondary | ICD-10-CM | POA: Diagnosis not present

## 2022-11-26 DIAGNOSIS — H2511 Age-related nuclear cataract, right eye: Secondary | ICD-10-CM | POA: Diagnosis not present

## 2022-11-26 DIAGNOSIS — H2513 Age-related nuclear cataract, bilateral: Secondary | ICD-10-CM | POA: Diagnosis not present

## 2022-12-21 DIAGNOSIS — H43393 Other vitreous opacities, bilateral: Secondary | ICD-10-CM | POA: Diagnosis not present

## 2023-02-25 ENCOUNTER — Ambulatory Visit (INDEPENDENT_AMBULATORY_CARE_PROVIDER_SITE_OTHER): Payer: PPO | Admitting: Family

## 2023-02-25 ENCOUNTER — Encounter: Payer: Self-pay | Admitting: Family

## 2023-02-25 VITALS — BP 118/68 | HR 69 | Temp 97.8°F | Ht 63.25 in | Wt 126.6 lb

## 2023-02-25 DIAGNOSIS — Z780 Asymptomatic menopausal state: Secondary | ICD-10-CM

## 2023-02-25 DIAGNOSIS — F411 Generalized anxiety disorder: Secondary | ICD-10-CM | POA: Diagnosis not present

## 2023-02-25 DIAGNOSIS — Z1231 Encounter for screening mammogram for malignant neoplasm of breast: Secondary | ICD-10-CM | POA: Diagnosis not present

## 2023-02-25 DIAGNOSIS — F330 Major depressive disorder, recurrent, mild: Secondary | ICD-10-CM | POA: Diagnosis not present

## 2023-02-25 MED ORDER — MIRTAZAPINE 15 MG PO TBDP: 15.0000 mg | ORAL_TABLET | Freq: Every day | ORAL | 1 refills | Status: DC

## 2023-02-25 NOTE — Patient Instructions (Addendum)
  Can go to a pharmacy and get your tetanus vaccination as well as your pneumonia vaccination.    ------------------------------------ I have sent an electronic order over to your preferred location for the following:  DO NOT SCHEDULE UNTIL AFTER 07/29/23  []   2D Mammogram  [x]   3D Mammogram  [x]   Bone Density   Please give this center a call to get scheduled at your convenience.   [x]   [facility]      [address] [place], [place]        [phone]         Make sure to wear two piece  clothing  No lotions powders or deodorants the day of the appointment Make sure to bring picture ID and insurance card.  Bring list of medications you are currently taking including any supplements.    Stop by the lab prior to leaving today. I will notify you of your results once received.   Recommendations on keeping yourself healthy:  - Exercise at least 30-45 minutes a day, 3-4 days a week.  - Eat a low-fat diet with lots of fruits and vegetables, up to 7-9 servings per day.  - Seatbelts can save your life. Wear them always.  - Smoke detectors on every level of your home, check batteries every year.  - Eye Doctor - have an eye exam every 1-2 years  - Safe sex - if you may be exposed to STDs, use a condom.  - Alcohol -  If you drink, do it moderately, less than 2 drinks per day.  - Health Care Power of Attorney. Choose someone to speak for you if you are not able.  - Depression is common in our stressful world.If you're feeling down or losing interest in things you normally enjoy, please come in for a visit.  - Violence - If anyone is threatening or hurting you, please call immediately.  Due to recent changes in healthcare laws, you may see results of your imaging and/or laboratory studies on MyChart before I have had a chance to review them.  I understand that in some cases there may be results that are confusing or concerning to you. Please understand  that not all results are received at the same time and often I may need to interpret multiple results in order to provide you with the best plan of care or course of treatment. Therefore, I ask that you please give me 2 business days to thoroughly review all your results before contacting my office for clarification. Should we see a critical lab result, you will be contacted sooner.   I will see you again in one year for your annual comprehensive exam unless otherwise stated and or with acute concerns.  It was a pleasure seeing you today! Please do not hesitate to reach out with any questions and or concerns.  Regards,   Mort Sawyers

## 2023-02-25 NOTE — Progress Notes (Unsigned)
Subjective:  Patient ID: Kathy Bishop, female    DOB: 08/16/1951  Age: 72 y.o. MRN: [id number]  Patient Care Team: Mort Sawyers, FNP as PCP - General (Family Medicine) Abigail Miyamoto, MD as Consulting Physician (General Surgery) Magrinat, Valentino Hue, MD (Inactive) as Consulting Physician (Oncology) Lonie Peak, MD as Attending Physician (Radiation Oncology) Axel Filler Larna Daughters, NP as Nurse Practitioner (Hematology and Oncology)   CC:  Chief Complaint  Patient presents with   Annual Exam    HPI Kathy Bishop is a 72 y.o. female who presents today for an annual physical exam. She reports consuming a general diet trying to work on weight loss she prefers to be less than 126, cutting down to salads and cutting down snacks and bread.  Does exercise mainly on ellipitical at home and walks daily   She generally feels well. She reports sleeping poorly. She does have additional problems to discuss today.   Vision:Within last year Dental:Receives regular dental care STD:The patient denies history of sexually transmitted disease.  Lung Cancer Screening with low-dose Chest CT: n/a - Adults age 71-80 who are current cigarette smokers or quit within the last 15 years. Must have 20 pack year history.  AAA Screening: n/a - Men age 12-75 who have ever smoked  Mammogram: 10/23 Last pap: >65 y/o  Colonoscopy: 2022 Bone density scan: 2018 Tetanus 2005 has not had in over 10 years.   Pt is with acute concerns.   GAD: was advised to increase to 30 mg lexapro however only taking 25 mg once daily. She decreased this because was feeling aching all over and not feeling right, decreased to 25 mg once daily and feeling much better. No longer aching.  Has tried buspirone in the past, had nausea, took prozac but didn't like the side effects. Hard to sleep at times, has a hard time falling asleep takes two tablets unisom that take a while to kick in but then will wake up every few hours typically to  pee. Feels both anxiety and depressive symptoms.   Advanced Directives Patient does have advanced directives including living will. She does have a copy in the electronic medical record.   DEPRESSION SCREENING    02/25/2023   10:34 AM 03/29/2022   11:39 AM 02/15/2022   12:34 PM 02/25/2017    3:34 PM 12/14/2016    9:04 AM 09/07/2016    9:41 AM  PHQ 2/9 Scores  PHQ - 2 Score  2 2 0 0 0  PHQ- 9 Score  8 7     Exception Documentation Patient refusal          ROS: Negative unless specifically indicated above in HPI.    Current Outpatient Medications:    escitalopram (LEXAPRO) 20 MG tablet, Take one 20 mg tablet along with one 10 mg tablet for total of 30 mg once daily, Disp: 90 tablet, Rfl: 1   mirtazapine (REMERON SOL-TAB) 15 MG disintegrating tablet, Take 1 tablet (15 mg total) by mouth at bedtime., Disp: 90 tablet, Rfl: 1    Objective:    BP 118/68 (BP Location: Left Arm)   Pulse 69   Temp 97.8 F (36.6 C) (Temporal)   Ht 5' 3.25" (1.607 m)   Wt 126 lb 9.6 oz (57.4 kg)   SpO2 99%   BMI 22.25 kg/m   BP Readings from Last 3 Encounters:  02/25/23 118/68  08/27/22 128/82  03/29/22 122/80      Physical Exam Constitutional:      General:  She is not in acute distress.    Appearance: Normal appearance. She is normal weight. She is not ill-appearing.  HENT:     Head: Normocephalic.     Right Ear: Tympanic membrane normal.     Left Ear: Tympanic membrane normal.     Nose: Nose normal.     Mouth/Throat:     Mouth: Mucous membranes are moist.  Eyes:     Extraocular Movements: Extraocular movements intact.     Pupils: Pupils are equal, round, and reactive to light.  Cardiovascular:     Rate and Rhythm: Normal rate and regular rhythm.  Pulmonary:     Effort: Pulmonary effort is normal.     Breath sounds: Normal breath sounds.  Abdominal:     General: Abdomen is flat. Bowel sounds are normal.     Palpations: Abdomen is soft.     Tenderness: There is no guarding or  rebound.  Musculoskeletal:        General: Normal range of motion.     Cervical back: Normal range of motion.  Skin:    General: Skin is warm.     Capillary Refill: Capillary refill takes less than 2 seconds.  Neurological:     General: No focal deficit present.     Mental Status: She is alert.  Psychiatric:        Mood and Affect: Mood normal.        Behavior: Behavior normal.        Thought Content: Thought content normal.        Judgment: Judgment normal.          Assessment & Plan:  Mild episode of recurrent major depressive disorder (HCC) Assessment & Plan: Continue lexapro 20 mg once daily  Start trial mirtazapine 15 mg nightly.    Orders: -     Mirtazapine; Take 1 tablet (15 mg total) by mouth at bedtime.  Dispense: 90 tablet; Refill: 1  GAD (generalized anxiety disorder) -     Mirtazapine; Take 1 tablet (15 mg total) by mouth at bedtime.  Dispense: 90 tablet; Refill: 1  Screening mammogram for breast cancer -     3D Screening Mammogram, Left and Right; Future  Postmenopausal -     DG Bone Density; Future  Pt declines lab work for today  Bone density ordered     Follow-up: Return in about 6 weeks (around 04/08/2023) for f/u anxiety.   Mort Sawyers, FNP

## 2023-03-01 ENCOUNTER — Other Ambulatory Visit: Payer: Self-pay | Admitting: Family

## 2023-03-01 DIAGNOSIS — F325 Major depressive disorder, single episode, in full remission: Secondary | ICD-10-CM

## 2023-03-01 DIAGNOSIS — F411 Generalized anxiety disorder: Secondary | ICD-10-CM

## 2023-03-02 NOTE — Assessment & Plan Note (Signed)
Continue lexapro 20 mg once daily  Start trial mirtazapine 15 mg nightly.

## 2023-05-13 ENCOUNTER — Other Ambulatory Visit: Payer: Self-pay | Admitting: Family

## 2023-05-13 DIAGNOSIS — F325 Major depressive disorder, single episode, in full remission: Secondary | ICD-10-CM

## 2023-05-13 DIAGNOSIS — F411 Generalized anxiety disorder: Secondary | ICD-10-CM

## 2023-06-03 ENCOUNTER — Other Ambulatory Visit: Payer: Self-pay | Admitting: Family

## 2023-06-03 DIAGNOSIS — F325 Major depressive disorder, single episode, in full remission: Secondary | ICD-10-CM

## 2023-06-03 DIAGNOSIS — F411 Generalized anxiety disorder: Secondary | ICD-10-CM

## 2023-08-31 ENCOUNTER — Other Ambulatory Visit: Payer: Self-pay | Admitting: Family

## 2023-08-31 DIAGNOSIS — Z780 Asymptomatic menopausal state: Secondary | ICD-10-CM

## 2023-09-01 ENCOUNTER — Ambulatory Visit: Payer: PPO | Admitting: Family

## 2023-09-01 ENCOUNTER — Encounter: Payer: Self-pay | Admitting: Family

## 2023-09-01 VITALS — BP 150/98 | HR 70 | Temp 97.8°F | Ht 63.0 in | Wt 126.0 lb

## 2023-09-01 DIAGNOSIS — Z853 Personal history of malignant neoplasm of breast: Secondary | ICD-10-CM | POA: Diagnosis not present

## 2023-09-01 DIAGNOSIS — N6325 Unspecified lump in the left breast, overlapping quadrants: Secondary | ICD-10-CM

## 2023-09-01 DIAGNOSIS — R234 Changes in skin texture: Secondary | ICD-10-CM

## 2023-09-01 NOTE — Patient Instructions (Signed)
  I have sent an electronic order over to your preferred location for the following:   [x]  Bilateral diagnostic breast mammogram and left breast ultrasound  Please give this center a call to get scheduled at your convenience.   [x]   [facility]      [address] [place], [place]        [phone]         Make sure to wear two piece  clothing  No lotions powders or deodorants the day of the appointment Make sure to bring picture ID and insurance card.  Bring list of medications you are currently taking including any supplements.

## 2023-09-01 NOTE — Assessment & Plan Note (Signed)
Ordering diag bil mammogram and left u/s breast with axilla Ddx mastitis, neoplasm, densities Low suspicion shingles due to no associated symptoms

## 2023-09-01 NOTE — Progress Notes (Signed)
   Established Patient Office Visit  Subjective:   Patient ID: Kathy Bishop, female    DOB: 1951-02-09  Age: 72 y.o. MRN: [id number]  CC:  Chief Complaint  Patient presents with   Acute Visit    Discoloration on L breast. First noticed this on Sunday.     HPI: Kathy Bishop is a 72 y.o. female presenting on 09/01/2023 for Acute Visit (Discoloration on L breast. First noticed this on Sunday. )  On Sunday noticed some redness on her left breast . She also noted some purple discoloration as well as some under her breast, almost appeared as a bruise. She does have h/o breast lumpectomy. Doesn't itch, isn't pain, denies nipple discharge. She does state it is slightly getting better. No known trauma or injury.   Was previously dx with left breast cancer, did complete radiation and had a left breast lumpectomy. No longer follows with oncology since post five years.   Last mammogram was 07/28/22, neg findings.       ROS: Negative unless specifically indicated above in HPI.   Relevant past medical history reviewed and updated as indicated.   Allergies and medications reviewed and updated.   Current Outpatient Medications:    escitalopram (LEXAPRO) 20 MG tablet, TAKE ONE 20 MG TABLET ALONG WITH ONE 10 MG TABLET FOR TOTAL OF 30 MG ONCE DAILY, Disp: 90 tablet, Rfl: 1  Allergies  Allergen Reactions   Buspirone Nausea Only    Objective:   BP (!) 150/98 (BP Location: Right Arm, Patient Position: Sitting, Cuff Size: Normal)   Pulse 70   Temp 97.8 F (36.6 C) (Temporal)   Ht 5\' 3"  (1.6 m)   Wt 126 lb (57.2 kg)   SpO2 97%   BMI 22.32 kg/m    Physical Exam Chest:  Breasts:    Left: Swelling, mass (see image) and skin change (ecchymosis and slight redness extending from left upper inner quadrantsuperior breast with some slight in the left axilla) present. No inverted nipple or nipple discharge.       Assessment & Plan:  Mass overlapping multiple quadrants of left  breast Assessment & Plan: Ordering diag bil mammogram and left u/s breast with axilla Ddx mastitis, neoplasm, densities Low suspicion shingles due to no associated symptoms  Orders: -     MM 3D DIAGNOSTIC MAMMOGRAM BILATERAL BREAST; Future -     [place] LIMITED ULTRASOUND INCLUDING AXILLA LEFT BREAST ; Future  History of left breast cancer -     MM 3D DIAGNOSTIC MAMMOGRAM BILATERAL BREAST; Future -     [place] LIMITED ULTRASOUND INCLUDING AXILLA LEFT BREAST ; Future  Breast skin changes -     MM 3D DIAGNOSTIC MAMMOGRAM BILATERAL BREAST; Future -     [place] LIMITED ULTRASOUND INCLUDING AXILLA LEFT BREAST ; Future     Follow up plan: Return if symptoms worsen or fail to improve.  Mort Sawyers, FNP

## 2023-09-16 ENCOUNTER — Ambulatory Visit
Admission: RE | Admit: 2023-09-16 | Discharge: 2023-09-16 | Disposition: A | Discharge status: Home / Self Care | Payer: PPO | Source: Ambulatory Visit | Attending: Family | Admitting: Family

## 2023-09-16 DIAGNOSIS — R234 Changes in skin texture: Secondary | ICD-10-CM

## 2023-09-16 DIAGNOSIS — Z853 Personal history of malignant neoplasm of breast: Secondary | ICD-10-CM

## 2023-09-16 DIAGNOSIS — N644 Mastodynia: Secondary | ICD-10-CM | POA: Diagnosis not present

## 2023-09-16 DIAGNOSIS — N6325 Unspecified lump in the left breast, overlapping quadrants: Secondary | ICD-10-CM

## 2023-09-16 DIAGNOSIS — N6459 Other signs and symptoms in breast: Secondary | ICD-10-CM | POA: Diagnosis not present

## 2023-09-30 ENCOUNTER — Ambulatory Visit: Payer: PPO

## 2023-10-24 ENCOUNTER — Other Ambulatory Visit: Payer: Self-pay | Admitting: Family

## 2023-10-24 DIAGNOSIS — F325 Major depressive disorder, single episode, in full remission: Secondary | ICD-10-CM

## 2023-10-24 DIAGNOSIS — F411 Generalized anxiety disorder: Secondary | ICD-10-CM

## 2024-03-20 ENCOUNTER — Other Ambulatory Visit: Payer: Self-pay | Admitting: Family

## 2024-03-20 DIAGNOSIS — F325 Major depressive disorder, single episode, in full remission: Secondary | ICD-10-CM

## 2024-03-20 DIAGNOSIS — F411 Generalized anxiety disorder: Secondary | ICD-10-CM

## 2024-04-09 DIAGNOSIS — H40033 Anatomical narrow angle, bilateral: Secondary | ICD-10-CM | POA: Diagnosis not present

## 2024-04-09 DIAGNOSIS — H43393 Other vitreous opacities, bilateral: Secondary | ICD-10-CM | POA: Diagnosis not present

## 2024-04-23 ENCOUNTER — Other Ambulatory Visit: Payer: PPO

## 2024-05-28 ENCOUNTER — Other Ambulatory Visit: Payer: Self-pay | Admitting: Family

## 2024-05-28 DIAGNOSIS — F325 Major depressive disorder, single episode, in full remission: Secondary | ICD-10-CM

## 2024-05-28 DIAGNOSIS — F411 Generalized anxiety disorder: Secondary | ICD-10-CM

## 2024-07-30 ENCOUNTER — Other Ambulatory Visit: Payer: Self-pay | Admitting: Family

## 2024-07-30 DIAGNOSIS — F411 Generalized anxiety disorder: Secondary | ICD-10-CM

## 2024-07-30 DIAGNOSIS — F325 Major depressive disorder, single episode, in full remission: Secondary | ICD-10-CM

## 2024-08-04 ENCOUNTER — Emergency Department (HOSPITAL_BASED_OUTPATIENT_CLINIC_OR_DEPARTMENT_OTHER)
Admission: EM | Admit: 2024-08-04 | Discharge: 2024-08-04 | Disposition: A | Discharge status: Home / Self Care | Source: Ambulatory Visit | Attending: Emergency Medicine | Admitting: Emergency Medicine

## 2024-08-04 ENCOUNTER — Other Ambulatory Visit: Payer: Self-pay

## 2024-08-04 ENCOUNTER — Emergency Department (HOSPITAL_BASED_OUTPATIENT_CLINIC_OR_DEPARTMENT_OTHER)

## 2024-08-04 ENCOUNTER — Ambulatory Visit (INDEPENDENT_AMBULATORY_CARE_PROVIDER_SITE_OTHER)

## 2024-08-04 ENCOUNTER — Encounter (HOSPITAL_BASED_OUTPATIENT_CLINIC_OR_DEPARTMENT_OTHER): Payer: Self-pay

## 2024-08-04 ENCOUNTER — Ambulatory Visit
Admission: EM | Admit: 2024-08-04 | Discharge: 2024-08-04 | Disposition: A | Discharge status: Other Acute Inpatient Hospital

## 2024-08-04 DIAGNOSIS — C7951 Secondary malignant neoplasm of bone: Secondary | ICD-10-CM | POA: Diagnosis not present

## 2024-08-04 DIAGNOSIS — K5900 Constipation, unspecified: Secondary | ICD-10-CM

## 2024-08-04 DIAGNOSIS — R112 Nausea with vomiting, unspecified: Secondary | ICD-10-CM | POA: Insufficient documentation

## 2024-08-04 DIAGNOSIS — R9389 Abnormal findings on diagnostic imaging of other specified body structures: Secondary | ICD-10-CM

## 2024-08-04 DIAGNOSIS — Z853 Personal history of malignant neoplasm of breast: Secondary | ICD-10-CM | POA: Insufficient documentation

## 2024-08-04 DIAGNOSIS — N8500 Endometrial hyperplasia, unspecified: Secondary | ICD-10-CM | POA: Diagnosis not present

## 2024-08-04 DIAGNOSIS — C787 Secondary malignant neoplasm of liver and intrahepatic bile duct: Secondary | ICD-10-CM | POA: Insufficient documentation

## 2024-08-04 DIAGNOSIS — R030 Elevated blood-pressure reading, without diagnosis of hypertension: Secondary | ICD-10-CM | POA: Insufficient documentation

## 2024-08-04 DIAGNOSIS — R109 Unspecified abdominal pain: Secondary | ICD-10-CM | POA: Diagnosis not present

## 2024-08-04 DIAGNOSIS — K7689 Other specified diseases of liver: Secondary | ICD-10-CM | POA: Diagnosis not present

## 2024-08-04 DIAGNOSIS — N281 Cyst of kidney, acquired: Secondary | ICD-10-CM | POA: Diagnosis not present

## 2024-08-04 DIAGNOSIS — R110 Nausea: Secondary | ICD-10-CM

## 2024-08-04 LAB — COMPREHENSIVE METABOLIC PANEL WITH GFR
ALT: 24 U/L (ref 0–44)
AST: 31 U/L (ref 15–41)
Albumin: 4.8 g/dL (ref 3.5–5.0)
Alkaline Phosphatase: 81 U/L (ref 38–126)
Anion gap: 14 (ref 5–15)
BUN: 14 mg/dL (ref 8–23)
CO2: 23 mmol/L (ref 22–32)
Calcium: 10.1 mg/dL (ref 8.9–10.3)
Chloride: 102 mmol/L (ref 98–111)
Creatinine, Ser: 0.79 mg/dL (ref 0.44–1.00)
GFR, Estimated: >60 mL/min (ref >60)
Glucose, Bld: 130 mg/dL — ABNORMAL HIGH (ref 70–99)
Potassium: 3.9 mmol/L (ref 3.5–5.1)
Sodium: 139 mmol/L (ref 135–145)
Total Bilirubin: 0.9 mg/dL (ref 0.0–1.2)
Total Protein: 7.2 g/dL (ref 6.5–8.1)

## 2024-08-04 LAB — LIPASE, BLOOD: Lipase: 21 U/L (ref 11–51)

## 2024-08-04 LAB — CBC
HCT: 42.5 % (ref 36.0–46.0)
Hemoglobin: 14.6 g/dL (ref 12.0–15.0)
MCH: 32.3 pg (ref 26.0–34.0)
MCHC: 34.4 g/dL (ref 30.0–36.0)
MCV: 94 fL (ref 80.0–100.0)
Platelets: 230 K/uL (ref 150–400)
RBC: 4.52 MIL/uL (ref 3.87–5.11)
RDW: 11.8 % (ref 11.5–15.5)
WBC: 7.1 K/uL (ref 4.0–10.5)
nRBC: 0 % (ref 0.0–0.2)

## 2024-08-04 LAB — TROPONIN T, HIGH SENSITIVITY: Troponin T High Sensitivity: <15 ng/L (ref 0–19)

## 2024-08-04 LAB — URINALYSIS, ROUTINE W REFLEX MICROSCOPIC
Bilirubin Urine: NEGATIVE
Glucose, UA: NEGATIVE mg/dL
Ketones, ur: 40 mg/dL — AB
Nitrite: NEGATIVE
Protein, ur: 30 mg/dL — AB
Specific Gravity, Urine: 1.023 (ref 1.005–1.030)
pH: 6.5 (ref 5.0–8.0)

## 2024-08-04 MED ORDER — ONDANSETRON 4 MG PO TBDP
4.0000 mg | ORAL_TABLET | Freq: Once | ORAL | Status: AC
  Administered 2024-08-04: 4 mg via ORAL

## 2024-08-04 MED ORDER — ONDANSETRON 4 MG PO TBDP: 4.0000 mg | ORAL_TABLET | Freq: Three times a day (TID) | ORAL | 0 refills | Status: DC | PRN

## 2024-08-04 MED ORDER — IOHEXOL 300 MG/ML  SOLN
100.0000 mL | Freq: Once | INTRAMUSCULAR | Status: AC | PRN
  Administered 2024-08-04: 100 mL via INTRAVENOUS

## 2024-08-04 MED ORDER — LINACLOTIDE 145 MCG PO CAPS: 145.0000 ug | ORAL_CAPSULE | Freq: Every day | ORAL | 0 refills | Status: DC

## 2024-08-04 MED ORDER — ONDANSETRON 4 MG PO TBDP
8.0000 mg | ORAL_TABLET | Freq: Once | ORAL | Status: AC
  Administered 2024-08-04: 8 mg via ORAL
  Filled 2024-08-04: qty 2

## 2024-08-04 NOTE — ED Provider Notes (Signed)
 [place] [facility] AT [facility] Provider Note   CSN: [id number] Arrival date & time: 08/04/24  1018     Patient presents with: Emesis and Nausea   Kathy Bishop is a 73 y.o. female.   Pt c/o nausea/vomiting in past day. Emesis not bloody or bilious. Also indicates feels constipated and has not had a full, large bm in several days (did pass small amount of stool today). No abd pain or distension. No fever or chills. No dysuria or gu c/o. No back/flank pain. No chest pain or sob. Went to urgent care and was told to go to ER.   The history is provided by the patient and medical records.  Emesis Associated symptoms: no abdominal pain, no chills, no cough, no diarrhea, no fever, no headaches and no sore throat        Prior to Admission medications   Medication Sig Start Date End Date Taking? Authorizing Provider  escitalopram  (LEXAPRO ) 20 MG tablet TAKE ONE 20 MG TABLET ALONG WITH ONE 10 MG TABLET FOR TOTAL OF 30 MG ONCE DAILY 07/30/24   Dugal, Tabitha, FNP  linaclotide  (LINZESS ) 145 MCG CAPS capsule Take 1 capsule (145 mcg total) by mouth daily before breakfast. 08/04/24   Reddick, Johnathan B, NP  ondansetron  (ZOFRAN -ODT) 4 MG disintegrating tablet Take 1 tablet (4 mg total) by mouth every 8 (eight) hours as needed for nausea or vomiting. 08/04/24   Reddick, Ethel NOVAK, NP  UNKNOWN TO PATIENT Fiber Suppliment    [provider]    Allergies: Buspirone     Review of Systems  Constitutional:  Negative for chills and fever.  HENT:  Negative for sore throat.   Respiratory:  Negative for cough and shortness of breath.   Cardiovascular:  Negative for chest pain and leg swelling.  Gastrointestinal:  Positive for nausea and vomiting. Negative for abdominal pain, blood in stool and diarrhea.  Genitourinary:  Negative for dysuria and flank pain.  Musculoskeletal:  Negative for back pain and neck pain.  Neurological:  Negative for headaches.    Updated  Vital Signs BP (!) 170/88   Pulse 72   Temp 97.6 F (36.4 C) (Oral)   Resp (!) 37   Ht 1.6 m (5' 3)   Wt 57.2 kg   SpO2 97%   BMI 22.34 kg/m   Physical Exam Vitals and nursing note reviewed.  Constitutional:      Appearance: Normal appearance. She is well-developed.  HENT:     Head: Atraumatic.     Nose: Nose normal.     Mouth/Throat:     Mouth: Mucous membranes are moist.  Eyes:     General: No scleral icterus.    Conjunctiva/sclera: Conjunctivae normal.  Neck:     Trachea: No tracheal deviation.  Cardiovascular:     Rate and Rhythm: Normal rate and regular rhythm.     Pulses: Normal pulses.     Heart sounds: Normal heart sounds. No murmur heard.    No friction rub. No gallop.  Pulmonary:     Effort: Pulmonary effort is normal. No respiratory distress.     Breath sounds: Normal breath sounds.  Abdominal:     General: Bowel sounds are normal. There is no distension.     Palpations: Abdomen is soft. There is no mass.     Tenderness: There is no abdominal tenderness. There is no guarding or rebound.     Hernia: No hernia is present.  Genitourinary:    Comments: No cva  tenderness.  Musculoskeletal:        General: No swelling or tenderness.     Cervical back: Neck supple. No muscular tenderness.  Skin:    General: Skin is warm and dry.     Findings: No rash.  Neurological:     Mental Status: She is alert.     Comments: Alert, speech normal.   Psychiatric:        Mood and Affect: Mood normal.     (all labs ordered are listed, but only abnormal results are displayed) Results for orders placed or performed during the hospital encounter of 08/04/24  CBC   Collection Time: 08/04/24 10:44 AM  Result Value Ref Range   WBC 7.1 4.0 - 10.5 K/uL   RBC 4.52 3.87 - 5.11 MIL/uL   Hemoglobin 14.6 12.0 - 15.0 g/dL   HCT 57.4 63.9 - 53.9 %   MCV 94.0 80.0 - 100.0 fL   MCH 32.3 26.0 - 34.0 pg   MCHC 34.4 30.0 - 36.0 g/dL   RDW 88.1 88.4 - 84.4 %   Platelets 230 150 -  400 K/uL   nRBC 0.0 0.0 - 0.2 %  Comprehensive metabolic panel with GFR   Collection Time: 08/04/24 10:44 AM  Result Value Ref Range   Sodium 139 135 - 145 mmol/L   Potassium 3.9 3.5 - 5.1 mmol/L   Chloride 102 98 - 111 mmol/L   CO2 23 22 - 32 mmol/L   Glucose, Bld 130 (H) 70 - 99 mg/dL   BUN 14 8 - 23 mg/dL   Creatinine, Ser 9.20 0.44 - 1.00 mg/dL   Calcium 89.8 8.9 - 89.6 mg/dL   Total Protein 7.2 6.5 - 8.1 g/dL   Albumin 4.8 3.5 - 5.0 g/dL   AST 31 15 - 41 U/L   ALT 24 0 - 44 U/L   Alkaline Phosphatase 81 38 - 126 U/L   Total Bilirubin 0.9 0.0 - 1.2 mg/dL   GFR, Estimated >39 >39 mL/min   Anion gap 14 5 - 15  Lipase, blood   Collection Time: 08/04/24 10:44 AM  Result Value Ref Range   Lipase 21 11 - 51 U/L  Troponin T, High Sensitivity   Collection Time: 08/04/24 10:44 AM  Result Value Ref Range   Troponin T High Sensitivity <15 0 - 19 ng/L  Urinalysis, Routine w reflex microscopic -Urine, Clean Catch   Collection Time: 08/04/24 11:55 AM  Result Value Ref Range   Color, Urine YELLOW YELLOW   APPearance CLEAR CLEAR   Specific Gravity, Urine 1.023 1.005 - 1.030   pH 6.5 5.0 - 8.0   Glucose, UA NEGATIVE NEGATIVE mg/dL   Hgb urine dipstick SMALL (A) NEGATIVE   Bilirubin Urine NEGATIVE NEGATIVE   Ketones, ur 40 (A) NEGATIVE mg/dL   Protein, ur 30 (A) NEGATIVE mg/dL   Nitrite NEGATIVE NEGATIVE   Leukocytes,Ua TRACE (A) NEGATIVE   RBC / HPF 11-20 0 - 5 RBC/hpf   WBC, UA 0-5 0 - 5 WBC/hpf   Bacteria, UA RARE (A) NONE SEEN   Squamous Epithelial / HPF 0-5 0 - 5 /HPF   Mucus PRESENT    CT ABDOMEN PELVIS W CONTRAST Result Date: 08/04/2024 EXAM: CT ABDOMEN AND PELVIS WITH CONTRAST 08/04/2024 12:26:27 PM TECHNIQUE: CT of the abdomen and pelvis was performed with the administration of 100 mL of iohexol (OMNIPAQUE) 300 MG/ML solution. Multiplanar reformatted images are provided for review. Automated exposure control, iterative reconstruction, and/or weight-based adjustment of  the  mA/kV was utilized to reduce the radiation dose to as low as reasonably achievable. COMPARISON: 09/20/2018 CLINICAL HISTORY: Abdominal pain, acute, nonlocalized; vomiting. FINDINGS: LOWER CHEST: No acute abnormality. LIVER: Unchanged cyst along the dome of the liver, the largest measures 1.1 cm. Interval development of bilobar liver metastases. Index lesion within segment 7 measures 2.4 x 2.1 cm (image 20/2). Index lesion within segment 4a measures 1.1 x 1.2 cm (image 17/2). Index lesion within segment 4b measures 2.3 x 2.0 cm (image 22/2). GALLBLADDER AND BILE DUCTS: Gallbladder is unremarkable. No biliary ductal dilatation. SPLEEN: No acute abnormality. PANCREAS: No acute abnormality. ADRENAL GLANDS: No acute abnormality. KIDNEYS, URETERS AND BLADDER: Bosniak class 1 cyst arises off the upper pole of the left kidney measuring 2.8 cm (image 20/2). Additional Bosniak class 1 and 2 left kidney cysts noted. Per consensus, no follow-up is needed for simple Bosniak type 1 and 2 renal cysts, unless the patient has a malignancy history or risk factors. No stones in the kidneys or ureters. No hydronephrosis. No perinephric or periureteral stranding. Urinary bladder is unremarkable. GI AND BOWEL: Stomach demonstrates no acute abnormality. The appendix is visualized and normal in caliber, without wall thickening, periappendiceal inflammation, or fluid. There is no bowel obstruction. PERITONEUM AND RETROPERITONEUM: No ascites or focal fluid collections. No signs of pneumoperitoneum. VASCULATURE: Aorta is normal in caliber. Aortic atherosclerotic calcifications. LYMPH NODES: No lymphadenopathy. REPRODUCTIVE ORGANS: Uterus with 1.1 cm thickness of the endometrial stripe. No adnexal mass. BONES AND SOFT TISSUES: Interval development of scattered sclerotic bone lesions within the lower thoracic and lumbar spine. Index lesion within the L3 vertebra measures 1 cm (image 32/2). No focal soft tissue abnormality. IMPRESSION: 1.  Interval development of bilateral liver metastases, with index lesions in segments 7 (2.4 x 2.1 cm), 4a (1.1 x 1.2 cm), and 4b (2.3 x 2.0 cm). 2. Interval development of scattered sclerotic bone lesions within the lower thoracic and lumbar spine, with an index lesion within the L3 vertebra measuring 1 cm. 3. The endometrial thickness measures 1.1 cm which, is considered abnormal in a postmenopausal female. Follow-up imaging with pelvic sonogram advised. Electronically signed by: Waddell Calk MD 08/04/2024 12:53 PM EDT RP Workstation: [id number]   DG Abd 1 View Result Date: 08/04/2024 EXAM: 1 VIEW XRAY OF THE ABDOMEN 08/04/2024 09:06:40 AM COMPARISON: None available. CLINICAL HISTORY: Constipation, rule out SBO. Patient presents to Urgent Care but is unable to provide a detailed history due to immediate onset of nausea and vomiting upon entering the room. She reports that similar episodes of nausea and vomiting occur when she becomes severely constipated, as is happening currently. Last bowel movement was a small amount this morning; prior to that, last significant bowel movement is unknown (a while ago). Patient has been using over-the-counter stool softeners without relief. FINDINGS: BOWEL: Nonobstructive bowel gas pattern. Moderate volume of stool in the rectosigmoid colon. Minimal stool in the left and right colon. Moderate colonic stool burden. SOFT TISSUES: No opaque urinary calculi. BONES: No acute osseous abnormality. Degenerative changes of the left hip. IMPRESSION: 1. No bowel obstruction. 2. Moderate colonic stool burden. Electronically signed by: Norleen Boxer MD 08/04/2024 09:33 AM EDT RP Workstation: [id number]     EKG: None  Radiology: CT ABDOMEN PELVIS W CONTRAST Result Date: 08/04/2024 EXAM: CT ABDOMEN AND PELVIS WITH CONTRAST 08/04/2024 12:26:27 PM TECHNIQUE: CT of the abdomen and pelvis was performed with the administration of 100 mL of iohexol (OMNIPAQUE) 300 MG/ML solution.  Multiplanar reformatted images are provided for review. Automated  exposure control, iterative reconstruction, and/or weight-based adjustment of the mA/kV was utilized to reduce the radiation dose to as low as reasonably achievable. COMPARISON: 09/20/2018 CLINICAL HISTORY: Abdominal pain, acute, nonlocalized; vomiting. FINDINGS: LOWER CHEST: No acute abnormality. LIVER: Unchanged cyst along the dome of the liver, the largest measures 1.1 cm. Interval development of bilobar liver metastases. Index lesion within segment 7 measures 2.4 x 2.1 cm (image 20/2). Index lesion within segment 4a measures 1.1 x 1.2 cm (image 17/2). Index lesion within segment 4b measures 2.3 x 2.0 cm (image 22/2). GALLBLADDER AND BILE DUCTS: Gallbladder is unremarkable. No biliary ductal dilatation. SPLEEN: No acute abnormality. PANCREAS: No acute abnormality. ADRENAL GLANDS: No acute abnormality. KIDNEYS, URETERS AND BLADDER: Bosniak class 1 cyst arises off the upper pole of the left kidney measuring 2.8 cm (image 20/2). Additional Bosniak class 1 and 2 left kidney cysts noted. Per consensus, no follow-up is needed for simple Bosniak type 1 and 2 renal cysts, unless the patient has a malignancy history or risk factors. No stones in the kidneys or ureters. No hydronephrosis. No perinephric or periureteral stranding. Urinary bladder is unremarkable. GI AND BOWEL: Stomach demonstrates no acute abnormality. The appendix is visualized and normal in caliber, without wall thickening, periappendiceal inflammation, or fluid. There is no bowel obstruction. PERITONEUM AND RETROPERITONEUM: No ascites or focal fluid collections. No signs of pneumoperitoneum. VASCULATURE: Aorta is normal in caliber. Aortic atherosclerotic calcifications. LYMPH NODES: No lymphadenopathy. REPRODUCTIVE ORGANS: Uterus with 1.1 cm thickness of the endometrial stripe. No adnexal mass. BONES AND SOFT TISSUES: Interval development of scattered sclerotic bone lesions within the  lower thoracic and lumbar spine. Index lesion within the L3 vertebra measures 1 cm (image 32/2). No focal soft tissue abnormality. IMPRESSION: 1. Interval development of bilateral liver metastases, with index lesions in segments 7 (2.4 x 2.1 cm), 4a (1.1 x 1.2 cm), and 4b (2.3 x 2.0 cm). 2. Interval development of scattered sclerotic bone lesions within the lower thoracic and lumbar spine, with an index lesion within the L3 vertebra measuring 1 cm. 3. The endometrial thickness measures 1.1 cm which, is considered abnormal in a postmenopausal female. Follow-up imaging with pelvic sonogram advised. Electronically signed by: Waddell Calk MD 08/04/2024 12:53 PM EDT RP Workstation: [id number]   DG Abd 1 View Result Date: 08/04/2024 EXAM: 1 VIEW XRAY OF THE ABDOMEN 08/04/2024 09:06:40 AM COMPARISON: None available. CLINICAL HISTORY: Constipation, rule out SBO. Patient presents to Urgent Care but is unable to provide a detailed history due to immediate onset of nausea and vomiting upon entering the room. She reports that similar episodes of nausea and vomiting occur when she becomes severely constipated, as is happening currently. Last bowel movement was a small amount this morning; prior to that, last significant bowel movement is unknown (a while ago). Patient has been using over-the-counter stool softeners without relief. FINDINGS: BOWEL: Nonobstructive bowel gas pattern. Moderate volume of stool in the rectosigmoid colon. Minimal stool in the left and right colon. Moderate colonic stool burden. SOFT TISSUES: No opaque urinary calculi. BONES: No acute osseous abnormality. Degenerative changes of the left hip. IMPRESSION: 1. No bowel obstruction. 2. Moderate colonic stool burden. Electronically signed by: Norleen Boxer MD 08/04/2024 09:33 AM EDT RP Workstation: [id number]     Procedures   Medications Ordered in the ED  ondansetron  (ZOFRAN -ODT) disintegrating tablet 8 mg (has no administration in time range)   iohexol (OMNIPAQUE) 300 MG/ML solution 100 mL (100 mLs Intravenous Contrast Given 08/04/24 1207)  Medical Decision Making Problems Addressed: Elevated blood pressure reading: acute illness or injury History of breast cancer: chronic illness or injury Increased endometrial stripe thickness: acute illness or injury Metastasis to bone [facility]): acute illness or injury with systemic symptoms that poses a threat to life or bodily functions Multiple metastatic lesions in liver [facility]): acute illness or injury that poses a threat to life or bodily functions Nausea: acute illness or injury with systemic symptoms  Amount and/or Complexity of Data Reviewed External Data Reviewed: notes. Labs: ordered. Decision-making details documented in ED Course. Radiology: ordered and independent interpretation performed. Decision-making details documented in ED Course. ECG/medicine tests: ordered and independent interpretation performed. Decision-making details documented in ED Course.  Risk Prescription drug management. Decision regarding hospitalization.   Iv ns. Continuous pulse ox and cardiac monitoring. Labs ordered/sent. Imaging ordered.   Differential diagnosis includes sbo, other acute intraabd process, constipation, etc. Dispo decision including potential need for admission considered - will get labs and imaging and reassess.   Reviewed nursing notes and prior charts for additional history. External reports reviewed. Additional history from:  Cardiac monitor: sinus rhythm, rate 68.  Zofran  po.   Labs reviewed/interpreted by me - wbc and hgb normal. Chem unremarkable.  Trop normal.   CT reviewed/interpreted by me - lesions in liver and bone concerning for metastatic disease. Uterine lining thickened.   Pt with remote hx breast ca, also with thickened uterine lining on todays imaging with concern for metastatic ca on imaging. Discussed w pt.    Pt with no  emesis today. Abd soft nt. Pt currently appears stable for ED d/c.   Rec close pcp/oncologist f/u.  Return precautions provided.       Final diagnoses:  None    ED Discharge Orders     None          Bernard Drivers, MD 08/04/24 1400

## 2024-08-04 NOTE — Discharge Instructions (Addendum)
  1. Nausea and vomiting, unspecified vomiting type (Primary) - ondansetron  (ZOFRAN -ODT) disintegrating tablet 8 mg given in UC for acute nausea and vomiting symptoms - DG Abd 1 View x-ray completed in UC shows no obstructive bowel gas pattern, moderate stool burden. - ondansetron  (ZOFRAN -ODT) 4 MG disintegrating tablet; Take 1 tablet (4 mg total) by mouth every 8 (eight) hours as needed for nausea or vomiting.  Dispense: 20 tablet; Refill: 0  2. Constipation, unspecified constipation type - linaclotide  (LINZESS ) 145 MCG CAPS capsule; Take 1 capsule (145 mcg total) by mouth daily before breakfast.  Dispense: 30 capsule; Refill: 0  -Continue to monitor symptoms for any change in severity if there is any escalation of current symptoms or development of new symptoms follow-up in ER for further evaluation and management.

## 2024-08-04 NOTE — ED Triage Notes (Signed)
 Arrives ambulatory to the ED with complaints of worsening nausea/vomiting x1 day with some concerns of worsening constipation (only having small bowel movements).

## 2024-08-04 NOTE — ED Notes (Signed)
 Patient is being discharged from the Urgent Care and sent to the Emergency Department via Private Vehicle (Spouse) . Per Provider, patient is in need of higher level of care due to Vomiting (Continual). Patient is aware and verbalizes understanding of plan of care.  Vitals:   08/04/24 0841  BP: (!) 173/97  Pulse: 76  Resp: 18  Temp: (!) 97.3 F (36.3 C)  SpO2: 98%

## 2024-08-04 NOTE — ED Provider Notes (Signed)
 [facility]  Note:  This document was prepared using Conservation officer, historic buildings and may include unintentional dictation errors.  MRN: [id number] DOB: [birth year]  Subjective:   Kathy Bishop is a 73 y.o. female presenting for Intractable nausea and vomiting and constipation times several days.  Patient reports that she does not remember when she had a good bowel movement.  Patient reports her last bowel movement was this morning is very small.  Patient has been using over-the-counter stool softener with minimal improvement to symptoms.  Patient reports that she had similar symptoms approximately a year ago and was seen at [facility] and prescribed Linzess  which seemed to relieve her symptoms immediately.  Patient was hoping for repeat prescription for Linzess  to help with her chronic idiopathic constipation.  Patient denies any opioid use or any other medications known to slow motility.  No current facility-administered medications for this encounter.  Current Outpatient Medications:    linaclotide  (LINZESS ) 145 MCG CAPS capsule, Take 1 capsule (145 mcg total) by mouth daily before breakfast., Disp: 30 capsule, Rfl: 0   ondansetron  (ZOFRAN -ODT) 4 MG disintegrating tablet, Take 1 tablet (4 mg total) by mouth every 8 (eight) hours as needed for nausea or vomiting., Disp: 20 tablet, Rfl: 0   UNKNOWN TO PATIENT, Fiber Suppliment, Disp: , Rfl:    escitalopram  (LEXAPRO ) 20 MG tablet, TAKE ONE 20 MG TABLET ALONG WITH ONE 10 MG TABLET FOR TOTAL OF 30 MG ONCE DAILY, Disp: 90 tablet, Rfl: 0   Allergies  Allergen Reactions   Buspirone  Nausea Only    Past Medical History:  Diagnosis Date   Anxiety    Breast cancer (HCC)    Depression    Dermatitis    allergic   Foot pain, right    History of radiation therapy 12/20/16- 01/17/17   Left Breast 40.05 Gy in 15 fractions and a 10 Gy boost in 5 fractions.    Malignant neoplasm of upper-outer quadrant of left breast in female, estrogen  receptor positive (HCC) 08/31/2016   Need for prophylactic hormone replacement therapy (postmenopausal)    Neuromuscular disorder (HCC)    Nocturia    Personal history of radiation therapy    Routine general medical examination at a health care facility    Symptomatic menopausal or female climacteric states      Past Surgical History:  Procedure Laterality Date   BREAST LUMPECTOMY Left    BREAST LUMPECTOMY WITH SENTINEL LYMPH NODE BIOPSY Left 11/09/2016   Procedure: LEFT BREAST LUMPECTOMY WITH SENTINEL LYMPH NODE BX;  Surgeon: Vicenta Poli, MD;  Location: [facility];  Service: General;  Laterality: Left;   EYE SURGERY  11/12/22   Removal of cataracts on both eyes   TONSILLECTOMY     age 33    Family History  Problem Relation Age of Onset   Alzheimer's disease Mother    Lung cancer Father        smoker   Alcohol abuse Father    Colon cancer Neg Hx    Esophageal cancer Neg Hx    Rectal cancer Neg Hx    Stomach cancer Neg Hx     Social History   Tobacco Use   Smoking status: Former   Smokeless tobacco: Never   Tobacco comments:    smoked years ago in her 20's socially  Vaping Use   Vaping status: Never Used  Substance Use Topics   Alcohol use: Yes    Comment: social   Drug use: No  ROS Refer to HPI for ROS details.  Objective:   Vitals: BP (!) 173/97 (BP Location: Left Arm)   Pulse 76   Temp (!) 97.3 F (36.3 C) (Temporal)   Resp 18   Ht 5' 3 (1.6 m)   Wt 126 lb 1.7 oz (57.2 kg)   SpO2 98%   BMI 22.34 kg/m   Physical Exam Vitals and nursing note reviewed.  Constitutional:      General: She is not in acute distress.    Appearance: Normal appearance. She is well-developed. She is not ill-appearing or toxic-appearing.  HENT:     Head: Normocephalic and atraumatic.  Cardiovascular:     Rate and Rhythm: Normal rate.  Pulmonary:     Effort: Pulmonary effort is normal. No respiratory distress.     Breath sounds: No stridor. No  wheezing.  Abdominal:     General: There is distension.     Palpations: Abdomen is soft.     Tenderness: There is abdominal tenderness. There is no right CVA tenderness or left CVA tenderness.  Skin:    General: Skin is warm and dry.  Neurological:     General: No focal deficit present.     Mental Status: She is alert and oriented to person, place, and time.  Psychiatric:        Mood and Affect: Mood normal.        Behavior: Behavior normal.     Procedures  No results found for this or any previous visit (from the past 24 hours).  DG Abd 1 View Result Date: 08/04/2024 EXAM: 1 VIEW XRAY OF THE ABDOMEN 08/04/2024 09:06:40 AM COMPARISON: None available. CLINICAL HISTORY: Constipation, rule out SBO. Patient presents to Urgent Care but is unable to provide a detailed history due to immediate onset of nausea and vomiting upon entering the room. She reports that similar episodes of nausea and vomiting occur when she becomes severely constipated, as is happening currently. Last bowel movement was a small amount this morning; prior to that, last significant bowel movement is unknown (a while ago). Patient has been using over-the-counter stool softeners without relief. FINDINGS: BOWEL: Nonobstructive bowel gas pattern. Moderate volume of stool in the rectosigmoid colon. Minimal stool in the left and right colon. Moderate colonic stool burden. SOFT TISSUES: No opaque urinary calculi. BONES: No acute osseous abnormality. Degenerative changes of the left hip. IMPRESSION: 1. No bowel obstruction. 2. Moderate colonic stool burden. Electronically signed by: Norleen Boxer MD 08/04/2024 09:33 AM EDT RP Workstation: [id number]     Assessment and Plan :     Discharge Instructions       1. Nausea and vomiting, unspecified vomiting type (Primary) - ondansetron  (ZOFRAN -ODT) disintegrating tablet 8 mg given in UC for acute nausea and vomiting symptoms - DG Abd 1 View x-ray completed in UC shows no  obstructive bowel gas pattern, moderate stool burden. - ondansetron  (ZOFRAN -ODT) 4 MG disintegrating tablet; Take 1 tablet (4 mg total) by mouth every 8 (eight) hours as needed for nausea or vomiting.  Dispense: 20 tablet; Refill: 0  2. Constipation, unspecified constipation type - linaclotide  (LINZESS ) 145 MCG CAPS capsule; Take 1 capsule (145 mcg total) by mouth daily before breakfast.  Dispense: 30 capsule; Refill: 0  -Continue to monitor symptoms for any change in severity if there is any escalation of current symptoms or development of new symptoms follow-up in ER for further evaluation and management.       Tyrique Sporn B Seriyah Collison   Keiana Tavella B,  NP 08/04/24 [id number]

## 2024-08-04 NOTE — Discharge Instructions (Addendum)
 It was our pleasure to provide your ER care today - we hope that you feel better.  Drink plenty of fluids/stay well hydrated. Take zofran  as need for nausea.   Your ct imaging was read as showing abnormally thickening to the lining of uterus, as well as multiple lesions suspicious for being metastatic cancer lesions of the liver and bone.  Follow up closely with oncology doctor this week - see have made referral - they should be contacting you with an appointment in the next few days - if you have not heard from them by Tuesday, call office/your oncologist to arrange appointment time.   Return to ER right away if worse, new symptoms, fevers, new/severe pain, persistent vomiting, chest pain, trouble breathing, or other concern.

## 2024-08-04 NOTE — ED Triage Notes (Signed)
 Patient presents to Urgent Care but is unable to provide a detailed history due to immediate onset of nausea and vomiting upon entering the room. She reports that similar episodes of nausea and vomiting occur when she becomes severely constipated, as is happening currently. Last bowel movement was a small amount this morning; prior to that, last significant bowel movement is unknown ("a while ago"). Patient has been using over-the-counter stool softeners without relief.

## 2024-08-06 ENCOUNTER — Encounter: Payer: Self-pay | Admitting: Emergency Medicine

## 2024-08-06 ENCOUNTER — Other Ambulatory Visit: Payer: Self-pay

## 2024-08-06 ENCOUNTER — Other Ambulatory Visit: Payer: Self-pay | Admitting: Family

## 2024-08-06 ENCOUNTER — Emergency Department: Admission: EM | Admit: 2024-08-06 | Discharge: 2024-08-06 | Disposition: A | Discharge status: Home / Self Care

## 2024-08-06 ENCOUNTER — Telehealth: Payer: Self-pay | Admitting: Family

## 2024-08-06 DIAGNOSIS — M79602 Pain in left arm: Secondary | ICD-10-CM | POA: Insufficient documentation

## 2024-08-06 DIAGNOSIS — R112 Nausea with vomiting, unspecified: Secondary | ICD-10-CM | POA: Diagnosis not present

## 2024-08-06 DIAGNOSIS — G8929 Other chronic pain: Secondary | ICD-10-CM | POA: Insufficient documentation

## 2024-08-06 DIAGNOSIS — M25561 Pain in right knee: Secondary | ICD-10-CM | POA: Diagnosis not present

## 2024-08-06 DIAGNOSIS — I10 Essential (primary) hypertension: Secondary | ICD-10-CM | POA: Diagnosis not present

## 2024-08-06 DIAGNOSIS — C799 Secondary malignant neoplasm of unspecified site: Secondary | ICD-10-CM

## 2024-08-06 DIAGNOSIS — F411 Generalized anxiety disorder: Secondary | ICD-10-CM

## 2024-08-06 DIAGNOSIS — R110 Nausea: Secondary | ICD-10-CM

## 2024-08-06 DIAGNOSIS — F325 Major depressive disorder, single episode, in full remission: Secondary | ICD-10-CM

## 2024-08-06 LAB — COMPREHENSIVE METABOLIC PANEL WITH GFR
ALT: 27 U/L (ref 0–44)
AST: 30 U/L (ref 15–41)
Albumin: 4.6 g/dL (ref 3.5–5.0)
Alkaline Phosphatase: 62 U/L (ref 38–126)
Anion gap: 13 (ref 5–15)
BUN: 15 mg/dL (ref 8–23)
CO2: 24 mmol/L (ref 22–32)
Calcium: 9.3 mg/dL (ref 8.9–10.3)
Chloride: 98 mmol/L (ref 98–111)
Creatinine, Ser: 0.75 mg/dL (ref 0.44–1.00)
GFR, Estimated: >60 mL/min (ref >60)
Glucose, Bld: 110 mg/dL — ABNORMAL HIGH (ref 70–99)
Potassium: 3.6 mmol/L (ref 3.5–5.1)
Sodium: 135 mmol/L (ref 135–145)
Total Bilirubin: 1.5 mg/dL — ABNORMAL HIGH (ref 0.0–1.2)
Total Protein: 7.9 g/dL (ref 6.5–8.1)

## 2024-08-06 LAB — CBC
HCT: 43.2 % (ref 36.0–46.0)
Hemoglobin: 15.1 g/dL — ABNORMAL HIGH (ref 12.0–15.0)
MCH: 32.6 pg (ref 26.0–34.0)
MCHC: 35 g/dL (ref 30.0–36.0)
MCV: 93.3 fL (ref 80.0–100.0)
Platelets: 239 K/uL (ref 150–400)
RBC: 4.63 MIL/uL (ref 3.87–5.11)
RDW: 11.6 % (ref 11.5–15.5)
WBC: 5.9 K/uL (ref 4.0–10.5)
nRBC: 0 % (ref 0.0–0.2)

## 2024-08-06 LAB — LIPASE, BLOOD: Lipase: 32 U/L (ref 11–51)

## 2024-08-06 MED ORDER — FAMOTIDINE 20 MG PO TABS: 20.0000 mg | ORAL_TABLET | Freq: Every day | ORAL | 0 refills | Status: DC

## 2024-08-06 MED ORDER — AMLODIPINE BESYLATE 5 MG PO TABS
5.0000 mg | ORAL_TABLET | Freq: Once | ORAL | Status: AC
  Administered 2024-08-06: 5 mg via ORAL
  Filled 2024-08-06: qty 1

## 2024-08-06 MED ORDER — AMLODIPINE BESYLATE 5 MG PO TABS: 5.0000 mg | ORAL_TABLET | Freq: Every day | ORAL | 1 refills | Status: AC

## 2024-08-06 MED ORDER — PROMETHAZINE HCL 25 MG RE SUPP
25.0000 mg | Freq: Once | RECTAL | Status: AC
  Administered 2024-08-06: 25 mg via RECTAL
  Filled 2024-08-06: qty 1

## 2024-08-06 MED ORDER — FAMOTIDINE 20 MG PO TABS
20.0000 mg | ORAL_TABLET | Freq: Once | ORAL | Status: AC
  Administered 2024-08-06: 20 mg via ORAL
  Filled 2024-08-06: qty 1

## 2024-08-06 MED ORDER — SUCRALFATE 1 G PO TABS: 1.0000 g | ORAL_TABLET | Freq: Three times a day (TID) | ORAL | 11 refills | Status: DC

## 2024-08-06 MED ORDER — PROMETHAZINE HCL 25 MG RE SUPP: 25.0000 mg | Freq: Four times a day (QID) | RECTAL | 0 refills | Status: DC | PRN

## 2024-08-06 MED ORDER — ALUM & MAG HYDROXIDE-SIMETH 200-200-20 MG/5ML PO SUSP
30.0000 mL | Freq: Once | ORAL | Status: AC
  Administered 2024-08-06: 30 mL via ORAL
  Filled 2024-08-06: qty 30

## 2024-08-06 NOTE — Telephone Encounter (Signed)
 Noted. I agree with immediate disposition

## 2024-08-06 NOTE — Telephone Encounter (Unsigned)
 Copied from CRM [id number]. Topic: Clinical - Medication Refill >> Aug 06, 2024 11:49 AM Mia F wrote: Medication: escitalopram  (LEXAPRO ) 20 MG tablet   Has the patient contacted their pharmacy? Yes (Agent: If no, request that the patient contact the pharmacy for the refill. If patient does not wish to contact the pharmacy document the reason why and proceed with request.) (Agent: If yes, when and what did the pharmacy advise?)  This is the patient's preferred pharmacy:   [facility] [address], [place], [place] [address] Phone: [phone]  Open Closes 10 PM  Is this the correct pharmacy for this prescription? Yes If no, delete pharmacy and type the correct one.   Has the prescription been filled recently? Yes  Is the patient out of the medication? Yes  Has the patient been seen for an appointment in the last year OR does the patient have an upcoming appointment? Yes  Can we respond through MyChart? Yes  Agent: Please be advised that Rx refills may take up to 3 business days. We ask that you follow-up with your pharmacy.

## 2024-08-06 NOTE — Discharge Instructions (Signed)
 You was seen in the emergency department for unrelenting nausea.  Workup today demonstrated no evidence of dehydration.  Your previous workup did demonstrate concern for metastatic disease.  Please keep your oncology appointment.  Oral fluids are more important than solids.  I have started you on some medications including blood pressure medications given how elevated your blood pressure has been at several appointments.  This may make you lightheaded so do not immediately drive until you know how this medication will affect you.  Please call your primary care physician on Monday and let him know that you had a normal CT scan and 2 ED visits in you been started on this blood pressure medication and there was concern for metastatic disease.  Please return with any acutely worsening symptoms or any other emergency.  It was very nice meeting you and I wish you the best of luck. -- RETURN PRECAUTIONS & AFTERCARE: (ENGLISH) RETURN PRECAUTIONS: Return immediately to the emergency department or see/call your doctor if you feel worse, weak or have changes in speech or vision, are short of breath, have fever, vomiting, pain, bleeding or dark stool, trouble urinating or any new issues. Return here or see/call your doctor if not improving as expected for your suspected condition. FOLLOW-UP CARE: Call your doctor and/or any doctors we referred you to for more advice and to make an appointment. Do this today, tomorrow or after the weekend. Some doctors only take PPO insurance so if you have HMO insurance you may want to contact your HMO or your regular doctor for referral to a specialist within your plan. Either way tell the doctor's office that it was a referral from the emergency department so you get the soonest possible appointment.  YOUR TEST RESULTS: Take result reports of any blood or urine tests, imaging tests and EKG's to your doctor and any referral doctor. Have any abnormal tests repeated. Your doctor or a  referral doctor can let you know when this should be done. Also make sure your doctor contacts this hospital to get any test results that are not currently available such as cultures or special tests for infection and final imaging reports, which are often not available at the time you leave the ER but which may list additional important findings that are not documented on the preliminary report. BLOOD PRESSURE: If your blood pressure was greater than 120/80 have your blood pressure rechecked within 1 to 2 weeks. MEDICATION SIDE EFFECTS: Do not drive, walk, bike, take the bus, etc. if you have received or are being prescribed any sedating medications such as those for pain or anxiety or certain antihistamines like Benadryl . If you have been give one of these here get a taxi home or have a friend drive you home. Ask your pharmacist to counsel you on potential side effects of any new medication

## 2024-08-06 NOTE — Telephone Encounter (Signed)
Can you triage please.  

## 2024-08-06 NOTE — Telephone Encounter (Signed)
 Copied from CRM #[id number]. Topic: Clinical - Refused Triage >> Aug 06, 2024 11:54 AM Mia F wrote: Patient/caller voiced complaints of Experiencing nauseousness. Had some imaging done and has not found what the cause is yet. Has not been eating and been drinking a very little. No other symptoms. She say she does not think she need to come in but she wants to see if there can be some medication for what she has going on. She was seen in the ER on 10/25 and declined making a hospital follow up and/or speaking with nurse. She says she will take a call back from a nurse  . Declined transfer to triage.

## 2024-08-06 NOTE — Telephone Encounter (Addendum)
 I spoke with pt; starting on 08/03/24 pt has had nausea; pt vomited bile on 08/03/24; pt has not been eating and drinking very little. Last thing pt ate was crackers on 08/03/24. Pt trying to drink water but it nauseates pt also. Pt has dry mouth but no abd pain, diarrhea or fever.  Ondansetron  ODT 4 mg taking q8h is not helping nausea. Pt is not taking Linzess  because pt said that can cause nausea also. Pt said several days since had normal BM. Pt has appt on 08/13/24 with oncology. No available appts at [facility] on 08/07/27 or10/28/25. Pt is going to [facility] ED for eval and any needed testing or IV fluids. Sending note to ONEIDA Patrick FNP who is out of office this afternoon and CHRISTELLA Crandall NP who is in office and [place] pool.

## 2024-08-06 NOTE — ED Triage Notes (Signed)
 Pt in via POV from home.  Complaints of ongoing nausea since Friday; seen at an Urgent Care and and at [facility], reports exam unremarkable and given RX for Zofran .  States nausea remains  unresolved and Zofran  does not help.  States she is unable to eat/drink other than sips of water.  Patient tearful in triage.  States she reached out to PCP, RN advised she be evaluated again in ER due to possible dehydration.  Denies any pain, denies any other complaints.  Ambulatory to triage, NAD noted at this time.

## 2024-08-06 NOTE — ED Provider Notes (Signed)
 [facility] Provider Note    Event Date/Time   First MD Initiated Contact with Patient 08/06/24 1631     (approximate)   History   Nausea   HPI  Kathy Bishop is a 73 y.o. female anxiety, depression, previous history of breast malignancy who presents to the emergency department with 9 days of nausea and vomiting.  Patient was previously seen at urgent care on 10/25 and subsequently sent to the emergency department that same day.  She had a CT abdomen pelvis with IV contrast which demonstrated metastatic disease to her level likely from her uterus.  She was prescribed Zofran  and she has an appointment to follow-up with oncology this upcoming Monday.  She states that the Zofran  is not controlling her symptoms and when she called her primary care physicians office she talked to the triage nurse who advised her to come to the emergency department to rule out any dehydration.  The patient denies any abdominal pain.  She is not having any chest pain or shortness of breath.  She presents with her husband who contributes to the history      Physical Exam   Triage Vital Signs: ED Triage Vitals [08/06/24 1540]  Encounter Vitals Group     BP (!) 170/116     Girls Systolic BP Percentile      Girls Diastolic BP Percentile      Boys Systolic BP Percentile      Boys Diastolic BP Percentile      Pulse Rate 80     Resp 16     Temp 98.5 F (36.9 C)     Temp Source Oral     SpO2 100 %     Weight 125 lb (56.7 kg)     Height 5' 3 (1.6 m)     Head Circumference      Peak Flow      Pain Score 0     Pain Loc      Pain Education      Exclude from Growth Chart     Most recent vital signs: Vitals:   08/06/24 1540  BP: (!) 170/116  Pulse: 80  Resp: 16  Temp: 98.5 F (36.9 C)  SpO2: 100%    Nursing Triage Note reviewed. Vital signs reviewed and patients oxygen saturation is normoxic  General: Patient is well nourished, well developed, awake and alert, resting  comfortably in no acute distress Head: Normocephalic and atraumatic Eyes: Normal inspection, extraocular muscles intact, no conjunctival pallor Ear, nose, throat: Normal external exam Neck: Normal range of motion Respiratory: Patient is in no respiratory distress, lungs CTAB Cardiovascular: Patient is not tachycardic, RRR without murmur appreciated GI: Abd SNT with no guarding or rebound  Back: Normal inspection of the back with good strength and range of motion throughout all ext Extremities: pulses intact with good cap refills, no LE pitting edema or calf tenderness Neuro: The patient is alert and oriented to person, place, and time, appropriately conversive, with 5/5 bilat UE/LE strength, no gross motor or sensory defects noted. Coordination appears to be adequate. Skin: Warm, dry, and intact Psych: normal mood and affect, no SI or HI  ED Results / Procedures / Treatments   Labs (all labs ordered are listed, but only abnormal results are displayed) Labs Reviewed  COMPREHENSIVE METABOLIC PANEL WITH GFR - Abnormal; Notable for the following components:      Result Value   Glucose, Bld 110 (*)    Total Bilirubin 1.5 (*)  All other components within normal limits  CBC - Abnormal; Notable for the following components:   Hemoglobin 15.1 (*)    All other components within normal limits  LIPASE, BLOOD     EKG EKG and rhythm strip are interpreted by myself:   EKG: [Normal sinus rhythm] at heart rate of 72, normal QRS duration, QTc 446, nonspecific ST segments and T waves no ectopy EKG not consistent with Acute STEMI Rhythm strip: NSR in lead II Although the read the EKG states atrial fibrillation I think this is artifact as I do visualize P waves and QRS complexes march out appropriately   RADIOLOGY None    PROCEDURES:  Critical Care performed: No  Procedures   MEDICATIONS ORDERED IN ED: Medications  amLODipine (NORVASC) tablet 5 mg (has no administration in time  range)  famotidine (PEPCID) tablet 20 mg (has no administration in time range)  promethazine  (PHENERGAN ) suppository 25 mg (25 mg Rectal Given 08/06/24 1758)  alum & mag hydroxide-simeth (MAALOX/MYLANTA) 200-200-20 MG/5ML suspension 30 mL (30 mLs Oral Given 08/06/24 1758)     IMPRESSION / MDM / ASSESSMENT AND PLAN / ED COURSE                                Differential diagnosis includes, but is not limited to, metastatic disease, electrolyte derangement anemia, atypical ACS   ED course: Patient is extremely well-appearing and abdomen is completely benign.  I reviewed the workup from 1020 02/2024 and I do not think repeat CT imaging is necessary today.  Blood work demonstrated no leukocytosis no anemia and her CMP demonstrated no elevation of liver function test and only very mildly elevated total bilirubin.  She had no acute renal insufficiency or profound electrolyte derangements.  Her EKG demonstrated no acute ischemia.  Patient was able to tolerate p.o.  Given her continued symptoms we will start her on Phenergan  suppositories nightly Pepcid and sacral fate.  Her blood pressure was elevated here and it was elevated on her emergency department visit and also her urgent care visit.  Given this and her age we will start her on a low-dose amlodipine as well.  She was advised to call her primary care physician tomorrow and follow-up with her oncologist as already scheduled on Monday.  She will return if any acutely worsening symptoms   Clinical Course as of 08/06/24 1758  Mon Aug 06, 2024  1648 Creatinine: 0.75 No acute renal insufficiency [HD]  1648 CBC(!) No leukocytosis [HD]    Clinical Course User Index [HD] Nicholaus Rolland BRAVO, MD   At time of discharge there is no evidence of acute life, limb, vision, or fertility threat. Patient has stable vital signs, pain is well controlled, patient is ambulatory and p.o. tolerant.  Discharge instructions were completed using the EPIC system. I  would refer you to those at this time. All warnings prescriptions follow-up etc. were discussed in detail with the patient. Patient indicates understanding and is agreeable with this plan. All questions answered.  Patient is made aware that they may return to the emergency department for any worsening or new condition or for any other emergency.  --  COPA: 5 The patient has the following acute or chronic illness/injury that poses a possible threat to life or bodily function: [X] : The patient has a potentially serious acute condition or an acute exacerbation of a chronic illness requiring urgent evaluation and management in the Emergency Department. The  clinical presentation necessitates immediate consideration of life-threatening or function-threatening diagnoses, even if they are ultimately ruled out.   FINAL CLINICAL IMPRESSION(S) / ED DIAGNOSES   Final diagnoses:  Nausea  Metastatic malignant neoplasm, unspecified site (HCC)  Hypertension, unspecified type     Rx / DC Orders   ED Discharge Orders          Ordered    promethazine  (PHENERGAN ) 25 MG suppository  Every 6 hours PRN        08/06/24 1749    amLODipine (NORVASC) 5 MG tablet  Daily        08/06/24 1749    famotidine (PEPCID) 20 MG tablet  Daily at bedtime        08/06/24 1749    sucralfate (CARAFATE) 1 g tablet  3 times daily with meals & bedtime        08/06/24 1749             Note:  This document was prepared using Dragon voice recognition software and may include unintentional dictation errors.   Nicholaus Rolland BRAVO, MD 08/06/24 1758

## 2024-08-07 NOTE — Telephone Encounter (Signed)
 Agreed and noted.  Thanks for your evaluation rena.

## 2024-08-09 ENCOUNTER — Other Ambulatory Visit: Payer: Self-pay | Admitting: Family

## 2024-08-09 DIAGNOSIS — F411 Generalized anxiety disorder: Secondary | ICD-10-CM

## 2024-08-09 DIAGNOSIS — F325 Major depressive disorder, single episode, in full remission: Secondary | ICD-10-CM

## 2024-08-09 NOTE — Telephone Encounter (Signed)
 Spoke with pt. She is having an issue with filling her lexapro  prescription. Pt is currently taking 30mg  daily and would like to have a prescription sent in for 10mg  tablets and 20mg  tablets so she does not have to cut any in half. Pt would also like a refill on promethazine  suppositories she was given at the ED for nausea.

## 2024-08-09 NOTE — Telephone Encounter (Signed)
 Copied from CRM #[id number]. Topic: Clinical - Medication Question >> Aug 09, 2024  9:00 AM Jasmin G wrote: Reason for CRM: Pt requested a call back from Ms. Dugal's team to discuss current med escitalopram  (LEXAPRO ) 20 MG tablet. Call pt back at [phone].

## 2024-08-09 NOTE — Telephone Encounter (Signed)
 Copied from CRM #[id number]. Topic: General - Other >> Aug 09, 2024  9:40 AM Laymon HERO wrote: Reason for CRM: Patient returning call to Albino Manuelita BROCKS, CMA to discuss medication

## 2024-08-09 NOTE — Addendum Note (Signed)
 Addended by: ALBINO SHAVER C on: 08/09/2024 03:19 PM   Modules accepted: Orders

## 2024-08-09 NOTE — Telephone Encounter (Signed)
 LM for pt to return call. Rx was sent in to [facility] [address] on 07/30/24.

## 2024-08-10 MED ORDER — PROMETHAZINE HCL 25 MG RE SUPP: 25.0000 mg | Freq: Four times a day (QID) | RECTAL | 0 refills | Status: DC | PRN

## 2024-08-10 MED ORDER — ESCITALOPRAM OXALATE 10 MG PO TABS: 10.0000 mg | ORAL_TABLET | Freq: Every day | ORAL | 0 refills | Status: DC

## 2024-08-10 MED ORDER — ESCITALOPRAM OXALATE 20 MG PO TABS: 20.0000 mg | ORAL_TABLET | Freq: Every day | ORAL | 1 refills | Status: AC

## 2024-08-10 NOTE — Telephone Encounter (Signed)
 Will send both  Have her make f/u with me when able

## 2024-08-10 NOTE — Addendum Note (Signed)
 Addended by: CORWIN ANTU on: 08/10/2024 11:12 AM   Modules accepted: Orders

## 2024-08-13 ENCOUNTER — Inpatient Hospital Stay: Admitting: Hematology and Oncology

## 2024-08-13 ENCOUNTER — Inpatient Hospital Stay: Attending: Hematology and Oncology

## 2024-08-13 VITALS — BP 160/93 | HR 104 | Temp 98.1°F | Resp 18 | Wt 120.8 lb

## 2024-08-13 DIAGNOSIS — Z801 Family history of malignant neoplasm of trachea, bronchus and lung: Secondary | ICD-10-CM | POA: Diagnosis not present

## 2024-08-13 DIAGNOSIS — Z923 Personal history of irradiation: Secondary | ICD-10-CM | POA: Diagnosis not present

## 2024-08-13 DIAGNOSIS — Z1732 Human epidermal growth factor receptor 2 negative status: Secondary | ICD-10-CM | POA: Insufficient documentation

## 2024-08-13 DIAGNOSIS — C50412 Malignant neoplasm of upper-outer quadrant of left female breast: Secondary | ICD-10-CM | POA: Diagnosis not present

## 2024-08-13 DIAGNOSIS — Z87891 Personal history of nicotine dependence: Secondary | ICD-10-CM | POA: Diagnosis not present

## 2024-08-13 DIAGNOSIS — Z1721 Progesterone receptor positive status: Secondary | ICD-10-CM | POA: Diagnosis not present

## 2024-08-13 DIAGNOSIS — Z79811 Long term (current) use of aromatase inhibitors: Secondary | ICD-10-CM | POA: Diagnosis not present

## 2024-08-13 DIAGNOSIS — C50911 Malignant neoplasm of unspecified site of right female breast: Secondary | ICD-10-CM

## 2024-08-13 DIAGNOSIS — Z853 Personal history of malignant neoplasm of breast: Secondary | ICD-10-CM

## 2024-08-13 DIAGNOSIS — Z170 Estrogen receptor positive status [ER+]: Secondary | ICD-10-CM | POA: Diagnosis not present

## 2024-08-13 DIAGNOSIS — C7951 Secondary malignant neoplasm of bone: Secondary | ICD-10-CM | POA: Diagnosis not present

## 2024-08-13 DIAGNOSIS — C787 Secondary malignant neoplasm of liver and intrahepatic bile duct: Secondary | ICD-10-CM | POA: Insufficient documentation

## 2024-08-13 MED ORDER — ZOLPIDEM TARTRATE 5 MG PO TABS: 5.0000 mg | ORAL_TABLET | Freq: Every evening | ORAL | 0 refills | Status: DC | PRN

## 2024-08-13 MED ORDER — LETROZOLE 2.5 MG PO TABS: 2.5000 mg | ORAL_TABLET | Freq: Every day | ORAL | 3 refills | Status: AC

## 2024-08-13 NOTE — Assessment & Plan Note (Signed)
 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 Left breast, needle core biopsy, 2 o'clock, 5cmfn: IDC, grade 2, ER+(95%), PR+(100%), Ki-67 10%, HER-2 negative (ratio 1.06).     11/09/2016:Left breast lumpectomy Jan): IDC with calcifications, Grade II/III, two foci, 3cm and 0.6 cm, DCIS with calcifications, +lymphovascular invasion, 1 SLN negative, T2, N0 Oncotype DX score 25: 17% risk Adjuvant radiation completed 01/17/2017 ---------------------------------------------------------------------------------------------------------------------- Prior treatment: Anastrozole  started 09/10/2016 switched to exemestane  and then letrozole  completed December 2022 Breast cancer surveillance: 1.  Breast exam 08/13/2024: Benign 2. Mammogram 09/16/2023 with ultrasound: No abnormality at the site of concern in the left breast.  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

## 2024-08-13 NOTE — Progress Notes (Signed)
 [facility] CONSULT NOTE  Patient Care Team: Odean Potts, MD as PCP - General (Hematology and Oncology) Vernetta Berg, MD as Consulting Physician (General Surgery) Izell Domino, MD as Attending Physician (Radiation Oncology) Crawford Morna Pickle, NP as Nurse Practitioner (Hematology and Oncology)  CHIEF COMPLAINTS/PURPOSE OF CONSULTATION:  Newly diagnosed liver and bone metastases  HISTORY OF PRESENTING ILLNESS:  History of Present Illness Kathy Bishop is a 73 year old female with a history of breast cancer who presents with unrelenting nausea and recent imaging findings of liver and bone lesions. She is accompanied by her sister, Mitzie.  She experiences persistent nausea that began recently, significantly impacting her ability to eat and drink. Similar symptoms occurred five years ago. Initial tests at urgent care, including an x-ray, showed no bowel obstruction, and she was directed to the ER for further evaluation.  At the ER, blood tests, an EKG, and a CT scan were performed. The CT scan showed liver lesions measuring 2.4 cm, 1.1 cm, and 2.3 cm, and bone lesions, particularly in the lower back area at L3. She feels tired, possibly due to depression, and experiences backaches, especially after physical activities.  Her past medical history includes estrogen receptor-positive, HER2-negative breast cancer diagnosed in 2017. She underwent a left breast biopsy, lumpectomy, and radiation therapy, and was on anastrozole  from 2017 to 2022. She did not receive chemotherapy based on an oncotype test predicting low risk. No significant weight loss, pain, or general malaise, although she occasionally experiences back pain, which she attributes to physical strain.     I reviewed her records extensively and collaborated the history with the patient.  SUMMARY OF ONCOLOGIC HISTORY: Oncology History Overview Note  3 1/   Malignant neoplasm of upper-outer quadrant of left  breast in female, estrogen receptor positive (HCC)  08/20/2016 Initial Biopsy   Left breast, needle core biopsy, 2 o'clock, 8cmfn: IDC, DCIS with necrosis, grade 2, ER+(95%), PR+(100%), Ki-67 15%, HER-2 negative (ratio 0.78). Left breast, needle core biopsy, 2 o'clock, 5cmfn: IDC, grade 2, ER+(95%), PR+(100%), Ki-67 10%, HER-2 negative (ratio 1.06).     09/10/2016 - 09/2021 Anti-estrogen oral therapy   Anastrozole  daily bone density 12/08/2016 showed a T score of -0.2 (normal).   11/09/2016 Surgery   Left breast lumpectomy Jan): IDC with calcifications, Grade II/III, two foci, 3cm and 0.6 cm, DCIS with calcifications, +lymphovascular invasion, 1 SLN negative, T2, N0   11/09/2016 Oncotype testing   25/17% intermediate risk   12/20/2016 - 01/17/2017 Radiation Therapy   Left Breast treated to 40.05 Gy in 15 fx of 2.67 Gy/fx  and a 10 Gy boost in 5 fx of 2 Gy/fx       MEDICAL HISTORY:  Past Medical History:  Diagnosis Date   Anxiety    Breast cancer (HCC)    Depression    Dermatitis    allergic   Foot pain, right    History of radiation therapy 12/20/16- 01/17/17   Left Breast 40.05 Gy in 15 fractions and a 10 Gy boost in 5 fractions.    Malignant neoplasm of upper-outer quadrant of left breast in female, estrogen receptor positive (HCC) 08/31/2016   Need for prophylactic hormone replacement therapy (postmenopausal)    Neuromuscular disorder (HCC)    Nocturia    Personal history of radiation therapy    Routine general medical examination at a health care facility    Symptomatic menopausal or female climacteric states     SURGICAL HISTORY: Past Surgical History:  Procedure Laterality  Date   BREAST LUMPECTOMY Left    BREAST LUMPECTOMY WITH SENTINEL LYMPH NODE BIOPSY Left 11/09/2016   Procedure: LEFT BREAST LUMPECTOMY WITH SENTINEL LYMPH NODE BX;  Surgeon: Vicenta Poli, MD;  Location: [facility];  Service: General;  Laterality: Left;   EYE SURGERY  11/12/22    Removal of cataracts on both eyes   TONSILLECTOMY     age 73    SOCIAL HISTORY: Social History   Socioeconomic History   Marital status: Married    Spouse name: Not on file   Number of children: 0   Years of education: Not on file   Highest education level: Bachelor's degree (e.g., BA, AB, BS)  Occupational History   Occupation: retired  Tobacco Use   Smoking status: Former   Smokeless tobacco: Never   Tobacco comments:    smoked years ago in her 20's socially  Vaping Use   Vaping status: Never Used  Substance and Sexual Activity   Alcohol use: Yes    Alcohol/week: 4.0 standard drinks of alcohol    Types: 4 Standard drinks or equivalent per week   Drug use: No   Sexual activity: Not Currently  Other Topics Concern   Not on file  Social History Narrative   Not on file   Social Drivers of Health   Financial Resource Strain: Low Risk  (08/31/2023)   Overall Financial Resource Strain (CARDIA)    Difficulty of Paying Living Expenses: Not very hard  Food Insecurity: No Food Insecurity (08/31/2023)   Hunger Vital Sign    Worried About Running Out of Food in the Last Year: Never true    Ran Out of Food in the Last Year: Never true  Transportation Needs: No Transportation Needs (08/31/2023)   PRAPARE - Administrator, Civil Service (Medical): No    Lack of Transportation (Non-Medical): No  Physical Activity: Sufficiently Active (08/31/2023)   Exercise Vital Sign    Days of Exercise per Week: 6 days    Minutes of Exercise per Session: 120 min  Stress: Stress Concern Present (08/31/2023)   [employer] of Occupational Health - Occupational Stress Questionnaire    Feeling of Stress : Rather much  Social Connections: Socially Isolated (08/31/2023)   Social Connection and Isolation Panel    Frequency of Communication with Friends and Family: Once a week    Frequency of Social Gatherings with Friends and Family: Once a week    Attends Religious Services:  Never    Database Administrator or Organizations: No    Attends Engineer, Structural: Not on file    Marital Status: Married  Catering Manager Violence: Not on file    FAMILY HISTORY: Family History  Problem Relation Age of Onset   Alzheimer's disease Mother    Lung cancer Father        smoker   Alcohol abuse Father    Colon cancer Neg Hx    Esophageal cancer Neg Hx    Rectal cancer Neg Hx    Stomach cancer Neg Hx     ALLERGIES:  is allergic to buspirone .  MEDICATIONS:  Current Outpatient Medications  Medication Sig Dispense Refill   letrozole  (FEMARA ) 2.5 MG tablet Take 1 tablet (2.5 mg total) by mouth daily. 90 tablet 3   zolpidem  (AMBIEN ) 5 MG tablet Take 1 tablet (5 mg total) by mouth at bedtime as needed for sleep. 30 tablet 0   amLODipine (NORVASC) 5 MG tablet Take 1 tablet (  5 mg total) by mouth daily. 90 tablet 1   escitalopram  (LEXAPRO ) 10 MG tablet Take 1 tablet (10 mg total) by mouth daily. 90 tablet 0   escitalopram  (LEXAPRO ) 20 MG tablet Take 1 tablet (20 mg total) by mouth daily. 90 tablet 1   famotidine (PEPCID) 20 MG tablet Take 1 tablet (20 mg total) by mouth at bedtime. 30 tablet 0   linaclotide  (LINZESS ) 145 MCG CAPS capsule Take 1 capsule (145 mcg total) by mouth daily before breakfast. 30 capsule 0   ondansetron  (ZOFRAN -ODT) 4 MG disintegrating tablet Take 1 tablet (4 mg total) by mouth every 8 (eight) hours as needed for nausea or vomiting. 20 tablet 0   promethazine  (PHENERGAN ) 25 MG suppository Place 1 suppository (25 mg total) rectally every 6 (six) hours as needed for nausea or vomiting. 12 each 0   sucralfate (CARAFATE) 1 g tablet Take 1 tablet (1 g total) by mouth 4 (four) times daily -  with meals and at bedtime. 120 tablet 11   UNKNOWN TO PATIENT Fiber Suppliment     No current facility-administered medications for this visit.    REVIEW OF SYSTEMS:   Constitutional: Denies fevers, chills or abnormal night sweats All other systems were  reviewed with the patient and are negative.  PHYSICAL EXAMINATION: ECOG PERFORMANCE STATUS: 1 - Symptomatic but completely ambulatory  Vitals:   08/13/24 1528  BP: (!) 160/93  Pulse: (!) 104  Resp: 18  Temp: 98.1 F (36.7 C)  SpO2: 100%   Filed Weights   08/13/24 1528  Weight: 120 lb 12.8 oz (54.8 kg)    GENERAL:alert, no distress and comfortable    LABORATORY DATA:  I have reviewed the data as listed Lab Results  Component Value Date   WBC 5.9 08/06/2024   HGB 15.1 (H) 08/06/2024   HCT 43.2 08/06/2024   MCV 93.3 08/06/2024   PLT 239 08/06/2024   Lab Results  Component Value Date   NA 135 08/06/2024   K 3.6 08/06/2024   CL 98 08/06/2024   CO2 24 08/06/2024    RADIOGRAPHIC STUDIES: I have personally reviewed the radiological reports and agreed with the findings in the report.  ASSESSMENT AND PLAN:  Malignant neoplasm of upper-outer quadrant of left breast in female, estrogen receptor positive (HCC) 08/20/2016:Left breast, needle core biopsy, 2 o'clock, 8cmfn: IDC, DCIS with necrosis, grade 2, ER+(95%), PR+(100%), Ki-67 15%, HER-2 negative (ratio 0.78). Left breast, needle core biopsy, 2 o'clock, 5cmfn: IDC, grade 2, ER+(95%), PR+(100%), Ki-67 10%, HER-2 negative (ratio 1.06).     11/09/2016:Left breast lumpectomy Jan): IDC with calcifications, Grade II/III, two foci, 3cm and 0.6 cm, DCIS with calcifications, +lymphovascular invasion, 1 SLN negative, T2, N0 Oncotype DX score 25: 17% risk Adjuvant radiation completed 01/17/2017 Adjuvant anastrozole  completed December 2022 ---------------------------------------------------------------------------------------------------------------------- CT abdomen pelvis 08/04/2024: Multiple liver metastases (2.4 cm, 1.1 cm, 2.3 cm), multiple bone metastases: L3 1 cm, endometrial thickening  Recommendation: Ultrasound-guided liver biopsy PET CT scan Start letrozole  immediately assuming that the final pathology will be  estrogen positive metastatic breast cancer. Depending on the final pathology additional treatments like Verzinio will be added if it is ER positive metastatic breast cancer. Tumor markers will be obtained today.  Return to clinic after the PET CT scan and biopsy.   All questions were answered. The patient knows to call the clinic with any problems, questions or concerns. I personally spent a total of 30 minutes in the care of the patient today including preparing to see  the patient, getting/reviewing separately obtained history, performing a medically appropriate exam/evaluation, counseling and educating, placing orders, referring and communicating with other health care professionals, documenting clinical information in the EHR, independently interpreting results, communicating results, and coordinating care.   Viinay K Tami Blass, MD 08/13/24

## 2024-08-14 ENCOUNTER — Ambulatory Visit

## 2024-08-14 ENCOUNTER — Encounter (HOSPITAL_COMMUNITY): Payer: Self-pay

## 2024-08-14 LAB — CANCER ANTIGEN 15-3: CA 15-3: 59.7 U/mL — ABNORMAL HIGH (ref 0.0–25.0)

## 2024-08-14 LAB — CANCER ANTIGEN 27.29: CA 27.29: 73.8 U/mL — ABNORMAL HIGH (ref 0.0–38.6)

## 2024-08-14 LAB — CEA (ACCESS): CEA (CHCC): 1.51 ng/mL (ref 0.00–5.00)

## 2024-08-14 NOTE — Progress Notes (Signed)
 Luverne Aran, MD  Michaelene Setter OK for US  guided liver lesion biopsy w/ sedation; any IR, any hospital.  GY       Previous Messages    ----- Message ----- From: Levonte Molina Sent: 08/14/2024   9:43 AM EST To: Zaakirah Kistner; Ir Procedure Requests Subject: US  liver biopsy                                Procedure : US  liver bx  Reason ;met breast cancer Dx: Malignant neoplasm of upper-outer quadrant of left breast in female, estrogen receptor positive (HCC) [C50.412, Z17.0 (ICD-10-CM)]; Adenocarcinoma of breast metastatic to liver, right (HCC) [C50.911, C78.7 (ICD-10-CM)]    History : CT abd pelv w/  Provider : Odean Potts, MD  Contact : [phone]

## 2024-08-16 ENCOUNTER — Ambulatory Visit (HOSPITAL_COMMUNITY)
Admission: RE | Admit: 2024-08-16 | Discharge: 2024-08-16 | Disposition: A | Discharge status: Home / Self Care | Source: Ambulatory Visit | Attending: Hematology and Oncology | Admitting: Hematology and Oncology

## 2024-08-16 ENCOUNTER — Other Ambulatory Visit: Payer: Self-pay | Admitting: Physician Assistant

## 2024-08-16 ENCOUNTER — Other Ambulatory Visit (HOSPITAL_COMMUNITY): Payer: Self-pay | Admitting: Student

## 2024-08-16 ENCOUNTER — Other Ambulatory Visit: Payer: Self-pay

## 2024-08-16 DIAGNOSIS — C50412 Malignant neoplasm of upper-outer quadrant of left female breast: Secondary | ICD-10-CM

## 2024-08-16 DIAGNOSIS — R160 Hepatomegaly, not elsewhere classified: Secondary | ICD-10-CM | POA: Diagnosis not present

## 2024-08-16 DIAGNOSIS — C50911 Malignant neoplasm of unspecified site of right female breast: Secondary | ICD-10-CM

## 2024-08-16 DIAGNOSIS — Z853 Personal history of malignant neoplasm of breast: Secondary | ICD-10-CM

## 2024-08-16 DIAGNOSIS — C787 Secondary malignant neoplasm of liver and intrahepatic bile duct: Secondary | ICD-10-CM | POA: Diagnosis not present

## 2024-08-16 DIAGNOSIS — Z170 Estrogen receptor positive status [ER+]: Secondary | ICD-10-CM | POA: Diagnosis not present

## 2024-08-16 DIAGNOSIS — C50919 Malignant neoplasm of unspecified site of unspecified female breast: Secondary | ICD-10-CM | POA: Diagnosis not present

## 2024-08-16 LAB — CBC
HCT: 41.2 % (ref 36.0–46.0)
Hemoglobin: 14 g/dL (ref 12.0–15.0)
MCH: 32.4 pg (ref 26.0–34.0)
MCHC: 34 g/dL (ref 30.0–36.0)
MCV: 95.4 fL (ref 80.0–100.0)
Platelets: 195 K/uL (ref 150–400)
RBC: 4.32 MIL/uL (ref 3.87–5.11)
RDW: 11.4 % — ABNORMAL LOW (ref 11.5–15.5)
WBC: 3.9 K/uL — ABNORMAL LOW (ref 4.0–10.5)
nRBC: 0 % (ref 0.0–0.2)

## 2024-08-16 LAB — PROTIME-INR
INR: 1 (ref 0.8–1.2)
Prothrombin Time: 13.8 s (ref 11.4–15.2)

## 2024-08-16 MED ORDER — SODIUM CHLORIDE 0.9 % IV SOLN: INTRAVENOUS | Status: DC

## 2024-08-16 MED ORDER — FENTANYL CITRATE (PF) 100 MCG/2ML IJ SOLN
INTRAMUSCULAR | Status: AC | PRN
  Administered 2024-08-16: 50 ug via INTRAVENOUS
  Administered 2024-08-16: 25 ug via INTRAVENOUS
  Administered 2024-08-16: 50 ug via INTRAVENOUS

## 2024-08-16 MED ORDER — GELATIN ABSORBABLE 12-7 MM EX MISC
1.0000 | Freq: Once | CUTANEOUS | Status: AC
  Administered 2024-08-16: 1 via TOPICAL

## 2024-08-16 MED ORDER — FENTANYL CITRATE (PF) 100 MCG/2ML IJ SOLN
INTRAMUSCULAR | Status: AC
  Filled 2024-08-16: qty 4

## 2024-08-16 MED ORDER — MIDAZOLAM HCL 2 MG/2ML IJ SOLN
INTRAMUSCULAR | Status: AC
  Filled 2024-08-16: qty 4

## 2024-08-16 MED ORDER — LIDOCAINE HCL (PF) 1 % IJ SOLN
10.0000 mL | Freq: Once | INTRAMUSCULAR | Status: AC
  Administered 2024-08-16: 10 mL via INTRADERMAL

## 2024-08-16 MED ORDER — MIDAZOLAM HCL (PF) 2 MG/2ML IJ SOLN
INTRAMUSCULAR | Status: AC | PRN
  Administered 2024-08-16 (×2): 1 mg via INTRAVENOUS

## 2024-08-16 NOTE — H&P (Signed)
 Chief Complaint: Patient was seen in consultation today for liver lesions.   Referring Physician(s): Odean Potts  Supervising Physician: Philip Cornet  Patient Status: [facility] - Out-pt  History of Present Illness: Kathy Bishop is a 73 y.o. female with a medical history significant for anxiety/depression and left breast cancer initially diagnosed in 2017. She is s/p lumpectomy, radiation therapy and anastrozole  x 5 years.  She recently developed intractable nausea, vomiting and constipation and was seen in the ED for evaluation. Work up revealed liver and bone lesions concerning for metastatic disease. She was provided supportive care and referred to Oncology for further work up. She met with Dr. Odean 08/13/24 and the recommendation was made for liver lesion biopsy.   Interventional Radiology has been asked to evaluate this patient for an image-guided liver lesion biopsy. Imaging reviewed and procedure approved by Dr. Luverne.   Past Medical History:  Diagnosis Date   Anxiety    Breast cancer (HCC)    Depression    Dermatitis    allergic   Foot pain, right    History of radiation therapy 12/20/16- 01/17/17   Left Breast 40.05 Gy in 15 fractions and a 10 Gy boost in 5 fractions.    Malignant neoplasm of upper-outer quadrant of left breast in female, estrogen receptor positive (HCC) 08/31/2016   Need for prophylactic hormone replacement therapy (postmenopausal)    Neuromuscular disorder (HCC)    Nocturia    Personal history of radiation therapy    Routine general medical examination at a health care facility    Symptomatic menopausal or female climacteric states     Past Surgical History:  Procedure Laterality Date   BREAST LUMPECTOMY Left    BREAST LUMPECTOMY WITH SENTINEL LYMPH NODE BIOPSY Left 11/09/2016   Procedure: LEFT BREAST LUMPECTOMY WITH SENTINEL LYMPH NODE BX;  Surgeon: Vicenta Poli, MD;  Location: [facility];  Service: General;  Laterality: Left;    EYE SURGERY  11/12/22   Removal of cataracts on both eyes   TONSILLECTOMY     age 67    Allergies: Buspirone   Medications: Prior to Admission medications   Medication Sig Start Date End Date Taking? Authorizing Provider  amLODipine (NORVASC) 5 MG tablet Take 1 tablet (5 mg total) by mouth daily. 08/06/24 08/06/25 Yes Nicholaus Rolland BRAVO, MD  escitalopram  (LEXAPRO ) 10 MG tablet Take 1 tablet (10 mg total) by mouth daily. 08/10/24  Yes Dugal, Ginger, FNP  famotidine (PEPCID) 20 MG tablet Take 1 tablet (20 mg total) by mouth at bedtime. 08/06/24 09/05/24 Yes Nicholaus Rolland BRAVO, MD  letrozole  (FEMARA ) 2.5 MG tablet Take 1 tablet (2.5 mg total) by mouth daily. 08/13/24  Yes Odean Potts, MD  linaclotide  (LINZESS ) 145 MCG CAPS capsule Take 1 capsule (145 mcg total) by mouth daily before breakfast. 08/04/24  Yes Reddick, Johnathan B, NP  ondansetron  (ZOFRAN -ODT) 4 MG disintegrating tablet Take 1 tablet (4 mg total) by mouth every 8 (eight) hours as needed for nausea or vomiting. 08/04/24  Yes Reddick, Johnathan B, NP  promethazine  (PHENERGAN ) 25 MG suppository Place 1 suppository (25 mg total) rectally every 6 (six) hours as needed for nausea or vomiting. 08/10/24  Yes Dugal, Ginger, FNP  sucralfate (CARAFATE) 1 g tablet Take 1 tablet (1 g total) by mouth 4 (four) times daily -  with meals and at bedtime. 08/06/24 08/06/25 Yes Nicholaus Rolland BRAVO, MD  UNKNOWN TO PATIENT Fiber Suppliment   Yes [provider]  zolpidem  (AMBIEN ) 5 MG tablet Take 1  tablet (5 mg total) by mouth at bedtime as needed for sleep. 08/13/24  Yes Gudena, Vinay, MD  escitalopram  (LEXAPRO ) 20 MG tablet Take 1 tablet (20 mg total) by mouth daily. 08/10/24   Corwin Antu, FNP     Family History  Problem Relation Age of Onset   Alzheimer's disease Mother    Lung cancer Father        smoker   Alcohol abuse Father    Colon cancer Neg Hx    Esophageal cancer Neg Hx    Rectal cancer Neg Hx    Stomach cancer Neg Hx      Social History   Socioeconomic History   Marital status: Married    Spouse name: Not on file   Number of children: 0   Years of education: Not on file   Highest education level: Bachelor's degree (e.g., BA, AB, BS)  Occupational History   Occupation: retired  Tobacco Use   Smoking status: Former   Smokeless tobacco: Never   Tobacco comments:    smoked years ago in her 20's socially  Vaping Use   Vaping status: Never Used  Substance and Sexual Activity   Alcohol use: Yes    Alcohol/week: 4.0 standard drinks of alcohol    Types: 4 Standard drinks or equivalent per week   Drug use: No   Sexual activity: Not Currently  Other Topics Concern   Not on file  Social History Narrative   Not on file   Social Drivers of Health   Financial Resource Strain: Low Risk  (08/31/2023)   Overall Financial Resource Strain (CARDIA)    Difficulty of Paying Living Expenses: Not very hard  Food Insecurity: No Food Insecurity (08/31/2023)   Hunger Vital Sign    Worried About Running Out of Food in the Last Year: Never true    Ran Out of Food in the Last Year: Never true  Transportation Needs: No Transportation Needs (08/31/2023)   PRAPARE - Administrator, Civil Service (Medical): No    Lack of Transportation (Non-Medical): No  Physical Activity: Sufficiently Active (08/31/2023)   Exercise Vital Sign    Days of Exercise per Week: 6 days    Minutes of Exercise per Session: 120 min  Stress: Stress Concern Present (08/31/2023)   [employer] of Occupational Health - Occupational Stress Questionnaire    Feeling of Stress : Rather much  Social Connections: Socially Isolated (08/31/2023)   Social Connection and Isolation Panel    Frequency of Communication with Friends and Family: Once a week    Frequency of Social Gatherings with Friends and Family: Once a week    Attends Religious Services: Never    Database Administrator or Organizations: No    Attends Museum/gallery Exhibitions Officer: Not on file    Marital Status: Married    Review of Systems: A 12 point ROS discussed and pertinent positives are indicated in the HPI above.  All other systems are negative.  Review of Systems  Constitutional:  Positive for appetite change.  Gastrointestinal:  Positive for nausea.    Vital Signs: BP (!) 143/86   Pulse 82   Temp 99.2 F (37.3 C)   Resp 16   Ht 5' 3 (1.6 m)   Wt 120 lb (54.4 kg)   SpO2 100%   BMI 21.26 kg/m   Physical Exam Constitutional:      General: She is not in acute distress.    Appearance: She is not  ill-appearing.  HENT:     Mouth/Throat:     Mouth: Mucous membranes are moist.     Pharynx: Oropharynx is clear.  Cardiovascular:     Rate and Rhythm: Normal rate.  Pulmonary:     Effort: Pulmonary effort is normal.  Abdominal:     Tenderness: There is no abdominal tenderness.  Skin:    General: Skin is warm and dry.  Neurological:     Mental Status: She is alert and oriented to person, place, and time.      Labs:  CBC: Recent Labs    08/04/24 1044 08/06/24 1545 08/16/24 0909  WBC 7.1 5.9 3.9*  HGB 14.6 15.1* 14.0  HCT 42.5 43.2 41.2  PLT 230 239 195    COAGS: Recent Labs    08/16/24 0909  INR 1.0    BMP: Recent Labs    08/04/24 1044 08/06/24 1545  NA 139 135  K 3.9 3.6  CL 102 98  CO2 23 24  GLUCOSE 130* 110*  BUN 14 15  CALCIUM 10.1 9.3  CREATININE 0.79 0.75  GFRNONAA >60 >60    LIVER FUNCTION TESTS: Recent Labs    08/04/24 1044 08/06/24 1545  BILITOT 0.9 1.5*  AST 31 30  ALT 24 27  ALKPHOS 81 62  PROT 7.2 7.9  ALBUMIN 4.8 4.6    TUMOR MARKERS: Recent Labs    08/13/24 1631  CEA 1.51    Assessment and Plan:  History of left breast cancer with suspected metastatic disease; liver lesions: Loa Dan, 73 year old female, presents today for an image-guided liver lesion biopsy.   Risks and benefits of this procedure were discussed with the patient and/or patient's family  including, but not limited to bleeding, infection, damage to adjacent structures or low yield requiring additional tests.  All of the questions were answered and there is agreement to proceed. She has been NPO. She does not take any blood-thinning medications.   Consent signed and in chart.  Thank you for this interesting consult.  I greatly enjoyed meeting Cassey Arey and look forward to participating in their care.  A copy of this report was sent to the requesting provider on this date.  Electronically Signed: Warren Dais, AGACNP-BC 08/16/2024, 10:18 AM   I spent a total of  30 Minutes   in face to face in clinical consultation, greater than 50% of which was counseling/coordinating care for liver lesion biopsy.

## 2024-08-16 NOTE — Procedures (Signed)
 Interventional Radiology Procedure Note  Procedure: Ultrasound guided liver mass biopsy  Findings: Please refer to procedural dictation for full description. 18 ga core x2 from right lobe liver mass near gallbladder.  Complications: None immediate  Estimated Blood Loss: < 5 ml  Recommendations: 3 hours bedrest. Follow Pathology results.   Ester Sides, MD

## 2024-08-17 ENCOUNTER — Other Ambulatory Visit: Payer: Self-pay | Admitting: Family

## 2024-08-20 ENCOUNTER — Encounter (HOSPITAL_COMMUNITY)
Admission: RE | Admit: 2024-08-20 | Discharge: 2024-08-20 | Disposition: A | Discharge status: Home / Self Care | Source: Ambulatory Visit | Attending: Hematology and Oncology | Admitting: Hematology and Oncology

## 2024-08-20 DIAGNOSIS — Z170 Estrogen receptor positive status [ER+]: Secondary | ICD-10-CM | POA: Diagnosis not present

## 2024-08-20 DIAGNOSIS — C787 Secondary malignant neoplasm of liver and intrahepatic bile duct: Secondary | ICD-10-CM | POA: Insufficient documentation

## 2024-08-20 DIAGNOSIS — C50412 Malignant neoplasm of upper-outer quadrant of left female breast: Secondary | ICD-10-CM | POA: Insufficient documentation

## 2024-08-20 DIAGNOSIS — C50911 Malignant neoplasm of unspecified site of right female breast: Secondary | ICD-10-CM | POA: Insufficient documentation

## 2024-08-20 DIAGNOSIS — C50919 Malignant neoplasm of unspecified site of unspecified female breast: Secondary | ICD-10-CM | POA: Diagnosis not present

## 2024-08-20 LAB — GLUCOSE, CAPILLARY: Glucose-Capillary: 84 mg/dL (ref 70–99)

## 2024-08-20 MED ORDER — FLUDEOXYGLUCOSE F - 18 (FDG) INJECTION
6.0000 | Freq: Once | INTRAVENOUS | Status: AC
  Administered 2024-08-20: 5.86 via INTRAVENOUS

## 2024-08-22 LAB — SURGICAL PATHOLOGY

## 2024-08-23 ENCOUNTER — Inpatient Hospital Stay: Admitting: Hematology and Oncology

## 2024-08-23 VITALS — BP 137/70 | HR 70 | Temp 98.0°F | Resp 17 | Ht 63.0 in | Wt 119.2 lb

## 2024-08-23 DIAGNOSIS — C50412 Malignant neoplasm of upper-outer quadrant of left female breast: Secondary | ICD-10-CM

## 2024-08-23 DIAGNOSIS — Z170 Estrogen receptor positive status [ER+]: Secondary | ICD-10-CM | POA: Diagnosis not present

## 2024-08-23 MED ORDER — ABEMACICLIB 100 MG PO TABS: 100.0000 mg | ORAL_TABLET | Freq: Two times a day (BID) | ORAL | 6 refills | Status: DC

## 2024-08-23 NOTE — Assessment & Plan Note (Signed)
 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 Left breast, needle core biopsy, 2 o'clock, 5cmfn: IDC, grade 2, ER+(95%), PR+(100%), Ki-67 10%, HER-2 negative (ratio 1.06).     11/09/2016:Left breast lumpectomy Jan): IDC with calcifications, Grade II/III, two foci, 3cm and 0.6 cm, DCIS with calcifications, +lymphovascular invasion, 1 SLN negative, T2, N0 Oncotype DX score 25: 17% risk Adjuvant radiation completed 01/17/2017 Adjuvant anastrozole  completed December 2022 ---------------------------------------------------------------------------------------------------------------------- CT abdomen pelvis 08/04/2024: Multiple liver metastases (2.4 cm, 1.1 cm, 2.3 cm), multiple bone metastases: L3 1 cm, endometrial thickening   Recommendation: Ultrasound-guided liver biopsy 08/16/2024: Metastatic carcinoma compatible with breast, ER 99%, PR 2%, Ki-67 5%, HER2 2+ by IHC, FISH negative ratio 1.39 PET CT scan 08/20/2024: Hypermetabolic liver lesions, left axilla soft tissue density hypermetabolic activity concerning for active malignancy, focus of activity left upper sternal body Recommendation: Letrozole  with Verzinio. Monitor tumor markers CA 15-3 and CEA 2729  Abemaciclib counseling: I discussed at length the risks and benefits of Abemaciclib in combination with letrozole . Adverse effects of Abemaciclib include decreasing neutrophil count, pneumonia, blood clots in lungs as well as nausea and GI symptoms. Side effects of letrozole  include hot flashes, muscle aches and pains, uterine bleeding/spotting/cancer, osteoporosis, risk of blood clots.  Return to clinic 1 week for education with Norleen

## 2024-08-23 NOTE — Progress Notes (Signed)
 Patient Care Team: Corwin Antu, FNP as PCP - General (Family Medicine) Vernetta Berg, MD as Consulting Physician (General Surgery) Izell Domino, MD as Attending Physician (Radiation Oncology) Crawford Morna Pickle, NP as Nurse Practitioner (Hematology and Oncology)  DIAGNOSIS:  Encounter Diagnosis  Name Primary?   Malignant neoplasm of upper-outer quadrant of left breast in female, estrogen receptor positive (HCC) Yes    SUMMARY OF ONCOLOGIC HISTORY: Oncology History Overview Note  3 1/   Malignant neoplasm of upper-outer quadrant of left breast in female, estrogen receptor positive (HCC)  08/20/2016 Initial Biopsy   Left breast, needle core biopsy, 2 o'clock, 8cmfn: IDC, DCIS with necrosis, grade 2, ER+(95%), PR+(100%), Ki-67 15%, HER-2 negative (ratio 0.78). Left breast, needle core biopsy, 2 o'clock, 5cmfn: IDC, grade 2, ER+(95%), PR+(100%), Ki-67 10%, HER-2 negative (ratio 1.06).     09/10/2016 - 09/2021 Anti-estrogen oral therapy   Anastrozole  daily bone density 12/08/2016 showed a T score of -0.2 (normal).   11/09/2016 Surgery   Left breast lumpectomy Jan): IDC with calcifications, Grade II/III, two foci, 3cm and 0.6 cm, DCIS with calcifications, +lymphovascular invasion, 1 SLN negative, T2, N0   11/09/2016 Oncotype testing   25/17% intermediate risk   12/20/2016 - 01/17/2017 Radiation Therapy   Left Breast treated to 40.05 Gy in 15 fx of 2.67 Gy/fx  and a 10 Gy boost in 5 fx of 2 Gy/fx      CHIEF COMPLIANT: Follow-up to discuss the results of PET CT scan and the liver biopsy  HISTORY OF PRESENT ILLNESS:  History of Present Illness Kathy Bishop is a 73 year old female with metastatic breast cancer who presents for follow-up and review of recent PET scan and liver biopsy results.  The recent PET scan reveals several hepatic lesions, a small area at the previous left chest wall surgery site, and a focus in the upper sternum. Liver biopsy confirms breast  origin of the cancer, with pathology showing 99% strong estrogen receptor positivity. HER2 status is negative following FISH testing, and progesterone  receptor status is nearly negative at 2%.  She is currently on letrozole . There is consideration for initiating Verzenio (abemaciclib) at 100 mg twice daily, with dose adjustments based on tolerability, particularly concerning diarrhea. She also takes Ambien  for sleep.     ALLERGIES:  is allergic to buspirone .  MEDICATIONS:  Current Outpatient Medications  Medication Sig Dispense Refill   amLODipine (NORVASC) 5 MG tablet Take 1 tablet (5 mg total) by mouth daily. 90 tablet 1   escitalopram  (LEXAPRO ) 10 MG tablet Take 1 tablet (10 mg total) by mouth daily. 90 tablet 0   escitalopram  (LEXAPRO ) 20 MG tablet Take 1 tablet (20 mg total) by mouth daily. 90 tablet 1   famotidine (PEPCID) 20 MG tablet Take 1 tablet (20 mg total) by mouth at bedtime. 30 tablet 0   letrozole  (FEMARA ) 2.5 MG tablet Take 1 tablet (2.5 mg total) by mouth daily. 90 tablet 3   linaclotide  (LINZESS ) 145 MCG CAPS capsule Take 1 capsule (145 mcg total) by mouth daily before breakfast. 30 capsule 0   ondansetron  (ZOFRAN -ODT) 4 MG disintegrating tablet Take 1 tablet (4 mg total) by mouth every 8 (eight) hours as needed for nausea or vomiting. 20 tablet 0   promethazine  (PHENERGAN ) 25 MG suppository Place 1 suppository (25 mg total) rectally every 6 (six) hours as needed for nausea or vomiting. 12 each 0   sucralfate (CARAFATE) 1 g tablet Take 1 tablet (1 g total) by mouth 4 (four)  times daily -  with meals and at bedtime. 120 tablet 11   UNKNOWN TO PATIENT Fiber Suppliment     zolpidem  (AMBIEN ) 5 MG tablet Take 1 tablet (5 mg total) by mouth at bedtime as needed for sleep. 30 tablet 0   No current facility-administered medications for this visit.    PHYSICAL EXAMINATION: ECOG PERFORMANCE STATUS: 1 - Symptomatic but completely ambulatory  Vitals:   08/23/24 1539 08/23/24 1540   BP: (!) 148/83 137/70  Pulse: 81 70  Resp: 17   Temp: 98 F (36.7 C)   SpO2: 100%    Filed Weights   08/23/24 1539  Weight: 119 lb 3.2 oz (54.1 kg)    LABORATORY DATA:  I have reviewed the data as listed    Latest Ref Rng & Units 08/06/2024    3:45 PM 08/04/2024   10:44 AM 11/12/2021   10:49 AM  CMP  Glucose 70 - 99 mg/dL 889  869  81   BUN 8 - 23 mg/dL 15  14  14    Creatinine 0.44 - 1.00 mg/dL 9.24  9.20  9.21   Sodium 135 - 145 mmol/L 135  139  139   Potassium 3.5 - 5.1 mmol/L 3.6  3.9  4.4   Chloride 98 - 111 mmol/L 98  102  106   CO2 22 - 32 mmol/L 24  23  29    Calcium 8.9 - 10.3 mg/dL 9.3  89.8  9.4   Total Protein 6.5 - 8.1 g/dL 7.9  7.2  6.8   Total Bilirubin 0.0 - 1.2 mg/dL 1.5  0.9  0.6   Alkaline Phos 38 - 126 U/L 62  81  70   AST 15 - 41 U/L 30  31  18    ALT 0 - 44 U/L 27  24  13      Lab Results  Component Value Date   WBC 3.9 (L) 08/16/2024   HGB 14.0 08/16/2024   HCT 41.2 08/16/2024   MCV 95.4 08/16/2024   PLT 195 08/16/2024   NEUTROABS 2.3 11/12/2021    ASSESSMENT & PLAN:  Malignant neoplasm of upper-outer quadrant of left breast in female, estrogen receptor positive (HCC) 08/20/2016:Left breast, needle core biopsy, 2 o'clock, 8cmfn: IDC, DCIS with necrosis, grade 2, ER+(95%), PR+(100%), Ki-67 15%, HER-2 negative (ratio 0.78). Left breast, needle core biopsy, 2 o'clock, 5cmfn: IDC, grade 2, ER+(95%), PR+(100%), Ki-67 10%, HER-2 negative (ratio 1.06).     11/09/2016:Left breast lumpectomy Jan): IDC with calcifications, Grade II/III, two foci, 3cm and 0.6 cm, DCIS with calcifications, +lymphovascular invasion, 1 SLN negative, T2, N0 Oncotype DX score 25: 17% risk Adjuvant radiation completed 01/17/2017 Adjuvant anastrozole  completed December 2022 ---------------------------------------------------------------------------------------------------------------------- CT abdomen pelvis 08/04/2024: Multiple liver metastases (2.4 cm, 1.1 cm, 2.3 cm),  multiple bone metastases: L3 1 cm, endometrial thickening   Recommendation: Ultrasound-guided liver biopsy 08/16/2024: Metastatic carcinoma compatible with breast, ER 99%, PR 2%, Ki-67 5%, HER2 2+ by IHC, FISH negative ratio 1.39 PET CT scan 08/20/2024: Hypermetabolic liver lesions, left axilla soft tissue density hypermetabolic activity concerning for active malignancy, focus of activity left upper sternal body Recommendation: Letrozole  with Verzinio. Monitor tumor markers CA 15-3 and CEA 2729  Abemaciclib counseling: I discussed at length the risks and benefits of Abemaciclib in combination with letrozole . Adverse effects of Abemaciclib include decreasing neutrophil count, pneumonia, blood clots in lungs as well as nausea and GI symptoms. Side effects of letrozole  include hot flashes, muscle aches and pains, uterine bleeding/spotting/cancer, osteoporosis, risk of  blood clots.  Return to clinic 1 week for education with Norleen   No orders of the defined types were placed in this encounter.  The patient has a good understanding of the overall plan. she agrees with it. she will call with any problems that may develop before the next visit here.  I personally spent a total of 45 minutes in the care of the patient today including preparing to see the patient, getting/reviewing separately obtained history, performing a medically appropriate exam/evaluation, counseling and educating, placing orders, referring and communicating with other health care professionals, documenting clinical information in the EHR, independently interpreting results, communicating results, and coordinating care.   Viinay K Xaria Judon, MD 08/23/24

## 2024-08-24 ENCOUNTER — Telehealth: Payer: Self-pay | Admitting: Pharmacist

## 2024-08-24 ENCOUNTER — Encounter (HOSPITAL_COMMUNITY)

## 2024-08-24 ENCOUNTER — Telehealth: Payer: Self-pay

## 2024-08-24 ENCOUNTER — Other Ambulatory Visit (HOSPITAL_COMMUNITY): Payer: Self-pay

## 2024-08-24 NOTE — Telephone Encounter (Signed)
 [facility]        Telephone: [phone]?Fax: [phone]   Oncology Clinical Pharmacist Practitioner Encounter  Received new prescription for abemaciclib for the treatment of metastatic breast cancer. This is being given in combination with letrozole . It is planned to continue until disease progression or unacceptable toxicity.  Labs from 08/06/24 assessed and will be used as baseline per Dr. Gudena. Prescription dose and frequency assessed.   Current medication list in Epic reviewed. Significant DDIs with abemaciclib identified:No.  Evaluated chart. Patient barriers to medication adherence identified: No.  Patient agreement for treatment documented in physician note on 08/23/24.  Prescription has been e-scribed to the [facility]) for benefits analysis and approval.  Oral Oncology Clinic will continue to follow for insurance authorization, copayment issues, initial counseling and start date.  Elany Felix A. Lucila, PharmD, BCOP, CPP Hematology-Oncology Clinical Pharmacist Practitioner  08/24/2024 9:03 AM  **Disclaimer: This note was dictated with voice recognition software. Similar sounding words can inadvertently be transcribed and this note may contain transcription errors which may not have been corrected upon publication of note.**

## 2024-08-24 NOTE — Telephone Encounter (Signed)
 Oral Oncology Patient Advocate Encounter   Received notification that prior authorization for VERZENIO is required.   PA submitted on 08/24/24 Key [id number] Status is pending      Charlott Hamilton,  CPhT-Adv  she/her/hers [facility]  [facility] Pharmacy Technician Patient Advocate Specialist III [facility] Phone: [phone]  Fax: [phone] [email or web address]

## 2024-08-24 NOTE — Telephone Encounter (Signed)
 Oral Oncology Patient Advocate Encounter  Patient does need copay assistance for Verzenio.There are no grants open at this time for breast cancer. She has choose to apply for assistance through [employer] PAP. I have sent the application pending her signatures and income information  through [place].  Status: Pending sign application from patient    Charlott Hamilton,  CPhT-Adv  she/her/hers [facility]  [facility] Pharmacy Technician Patient Advocate Specialist III [facility] Phone: [phone]  Fax: [phone] [email or web address]

## 2024-08-24 NOTE — Telephone Encounter (Signed)
 Oral Oncology Patient Advocate Encounter  Prior Authorization for Verzenio has been approved.    PA# [id number] Effective dates: 10/25/23 through 08/24/25  Patients co-pay is $1152.41.  I will follow up with patient to see if copay assistance is needed    Charlott Hamilton,  CPhT-Adv  she/her/hers [place]  [place] [facility] Pharmacy Technician Patient Advocate Specialist III [facility] Phone: [phone]  Fax: [phone] [email or web address]

## 2024-08-27 ENCOUNTER — Other Ambulatory Visit (HOSPITAL_COMMUNITY)

## 2024-08-27 ENCOUNTER — Ambulatory Visit (HOSPITAL_COMMUNITY)

## 2024-08-28 NOTE — Telephone Encounter (Signed)
 Oral Oncology Patient Advocate Encounter  Status: Pending Dr. Gara signature for PAP application for Verzenio.  Patient has signed the application.

## 2024-08-29 NOTE — Progress Notes (Unsigned)
 [facility]        Telephone: [phone]?Fax: [phone]   Oncology Clinical Pharmacist Practitioner Initial Assessment   Kathy Bishop is a 73 y.o. female with a diagnosis of breast cancer. They were contacted today via {JAIVISITTYPE:26375::in-person} visit.  Indication/Regimen Abemaciclib  (Verzenio ) is being used appropriately for treatment of *** by Dr. KATHERNE. The treatment goal is: {JAIGOALSOFTREATMENT:33314}.     Wt Readings from Last 1 Encounters:  08/23/24 119 lb 3.2 oz (54.1 kg)    Estimated body surface area is 1.55 meters squared as calculated from the following:   Height as of 08/23/24: 5' 3 (1.6 m).   Weight as of 08/23/24: 119 lb 3.2 oz (54.1 kg).  The dosing regimen is *** mg by mouth every 12 hours on days 1 to 28 of a 28-day cycle. This is being given  {JAIMEDMONOCOMBO:25240::monotherapy}. It is planned to continue until {JAI Disease Progression or unacceptable toxicity:26362::disease progression or unacceptable toxicity}. Prescription dose and frequency assessed for appropriateness.  Patient has agreed to treatment which is documented in physician note on ***. Counseled patient on administration, dosing, side effects, monitoring, drug-food interactions, safe handling, storage, and disposal.  ***   Dose Modifications ***  Access Assessment Kathy Bishop will be receiving abemaciclib  through {JAIPHARMACY:25275::[facility]} Insurance Concerns: *** Start date if known: ***  Adherence Assessment Reviewed importance on keeping a med schedule and plan for any missed doses Barriers to adherence identified? {JAI barriers to adherence:31627::No}  Communication and Learning Assessment Primary learner: *** Barriers to learning: {Barriers to learning:33317} Preferred language: {Preferred Language:33318} Learning preferences: {Learning preference:33319}   Allergies Allergies  Allergen Reactions    Buspirone  Nausea Only    Vitals    08/23/2024    3:40 PM 08/23/2024    3:39 PM 08/16/2024    2:00 PM  Oncology Vitals  Height  160 cm   Weight  54.069 kg   Weight (lbs)  119 lbs 3 oz   BMI  21.12 kg/m2   Temp  98 F (36.7 C)   Pulse Rate 70 81 66  BP 137/70 148/83 129/81  Resp  17   SpO2  100 % 96 %  BSA (m2)  1.55 m2      Laboratory Data    Latest Ref Rng & Units 08/16/2024    9:09 AM 08/06/2024    3:45 PM 08/04/2024   10:44 AM  CBC EXTENDED  WBC 4.0 - 10.5 K/uL 3.9  5.9  7.1   RBC 3.87 - 5.11 MIL/uL 4.32  4.63  4.52   Hemoglobin 12.0 - 15.0 g/dL 85.9  84.8  85.3   HCT 36.0 - 46.0 % 41.2  43.2  42.5   Platelets 150 - 400 K/uL 195  239  230        Latest Ref Rng & Units 08/06/2024    3:45 PM 08/04/2024   10:44 AM 11/12/2021   10:49 AM  CMP  Glucose 70 - 99 mg/dL 889  869  81   BUN 8 - 23 mg/dL 15  14  14    Creatinine 0.44 - 1.00 mg/dL 9.24  9.20  9.21   Sodium 135 - 145 mmol/L 135  139  139   Potassium 3.5 - 5.1 mmol/L 3.6  3.9  4.4   Chloride 98 - 111 mmol/L 98  102  106   CO2 22 - 32 mmol/L 24  23  29    Calcium 8.9 - 10.3 mg/dL 9.3  89.8  9.4  Total Protein 6.5 - 8.1 g/dL 7.9  7.2  6.8   Total Bilirubin 0.0 - 1.2 mg/dL 1.5  0.9  0.6   Alkaline Phos 38 - 126 U/L 62  81  70   AST 15 - 41 U/L 30  31  18    ALT 0 - 44 U/L 27  24  13     No results found for: MG Lab Results  Component Value Date   CA2729 73.8 (H) 08/13/2024    Contraindications Contraindications were reviewed? {yes/no:20286} Contraindications to therapy were identified? {YES/NO:21197}  Safety Precautions The following safety precautions for the use of abemaciclib were reviewed:  Changes in kidney function: importance of drinking plenty of fluids and monitoring urine output Diarrhea: we reviewed that diarrhea is common with abemaciclib and confirmed that she does have loperamide (Imodium) at home.  We reviewed how to take this medication PRN and gave her information on  abemaciclib Decreased white blood cells (WBCs) and increased risk for infection: we discussed the importance of having a thermometer and what the Centers for Disease Control and Prevention (CDC) considers a fever which is 100.71F (38C) or higher.  Gave patient 24/7 triage line to call if any fevers or symptoms Decreased hemoglobin, part of red blood cells that carry iron and oxygen Fatigue Nausea and Vomiting Hepatotoxicity: reviewed to contact clinic for RUQ pain that will not subside, yellowing of eyes/skin Decreased appetite or weight loss Abdominal pain Decreased platelet count and increased risk for bleeding Venous thromboembolism (VTE): reviewed signs of deep vein thrombosis (DVT) such as leg swelling, redness, pain, or tenderness and signs of pulmonary embolism (PE) such as shortness of breath, rapid or irregular heartbeat, cough, chest pain, or lightheadedness ILD/Pneumonitis: we reviewed potential symptoms including cough, shortness, and fatigue. Handling body fluids and waste Pregnancy, sexual activity, and contraception Avoid grapefruit products Reviewed to take the medication every 12 hours (with food sometimes can be easier on the stomach) and to take it at the same time every day. Discussed proper storage and handling of abemaciclib ***  Medication Reconciliation Current Outpatient Medications  Medication Sig Dispense Refill   abemaciclib (VERZENIO) 100 MG tablet Take 1 tablet (100 mg total) by mouth 2 (two) times daily. 60 tablet 6   amLODipine (NORVASC) 5 MG tablet Take 1 tablet (5 mg total) by mouth daily. 90 tablet 1   escitalopram  (LEXAPRO ) 10 MG tablet Take 1 tablet (10 mg total) by mouth daily. 90 tablet 0   escitalopram  (LEXAPRO ) 20 MG tablet Take 1 tablet (20 mg total) by mouth daily. 90 tablet 1   famotidine (PEPCID) 20 MG tablet Take 1 tablet (20 mg total) by mouth at bedtime. 30 tablet 0   letrozole  (FEMARA ) 2.5 MG tablet Take 1 tablet (2.5 mg total) by mouth  daily. 90 tablet 3   linaclotide  (LINZESS ) 145 MCG CAPS capsule Take 1 capsule (145 mcg total) by mouth daily before breakfast. 30 capsule 0   ondansetron  (ZOFRAN -ODT) 4 MG disintegrating tablet Take 1 tablet (4 mg total) by mouth every 8 (eight) hours as needed for nausea or vomiting. 20 tablet 0   promethazine  (PHENERGAN ) 25 MG suppository Place 1 suppository (25 mg total) rectally every 6 (six) hours as needed for nausea or vomiting. 12 each 0   sucralfate (CARAFATE) 1 g tablet Take 1 tablet (1 g total) by mouth 4 (four) times daily -  with meals and at bedtime. 120 tablet 11   UNKNOWN TO PATIENT Fiber Suppliment     zolpidem  (  AMBIEN ) 5 MG tablet Take 1 tablet (5 mg total) by mouth at bedtime as needed for sleep. 30 tablet 0   No current facility-administered medications for this visit.    Medication reconciliation is based on the patient's most recent medication list in the electronic medical record (EMR) including herbal products and OTC medications.   The patient's medication list was reviewed today with the patient? {YES/NO:21197}  Drug-drug interactions (DDIs) DDIs were evaluated? {yes/no:20286} Significant DDIs identified? {YES/NO:21197}  Drug-Food Interactions Drug-food interactions were evaluated? {yes/no:20286} Drug-food interactions identified? Grapefruit products  Follow-up Plan  Patient education handout given to patient Start abemaciclib 100 mg by mouth every 12 hours. Start date TBD Continue letrozole  2.5 mg by mouth daily Potentially start bone strengthener after speaking with Dr. Gudena on 09/17/24. Dental clearance given today in clinic Monitor for side effects Distress thermometer completed during {ksvisittype:33122} and reviewed with patient. Due to score, social work referral has {social work yes/no:33124} *** Kathy Bishop can follow up with clinical pharmacy as deemed necessary by Dr. KATHERNE going forward   Kathy Bishop participated in the  discussion, expressed understanding, and voiced agreement with the above plan. All questions were answered to her satisfaction. The patient was advised to contact the clinic at [phone] with any questions or concerns prior to her return visit.   I spent 60 minutes assessing the patient.  Kathy Bishop A. Lucila, PharmD, BCOP, CPP  Kathy Bishop Lucila, RPH-CPP, 08/29/2024 8:06 AM  **Disclaimer: This note was dictated with voice recognition software. Similar sounding words can inadvertently be transcribed and this note may contain transcription errors which may not have been corrected upon publication of note.**

## 2024-08-30 ENCOUNTER — Inpatient Hospital Stay: Admitting: Pharmacist

## 2024-08-30 VITALS — BP 123/74 | HR 80 | Temp 97.9°F | Resp 16 | Wt 119.0 lb

## 2024-08-30 DIAGNOSIS — C50412 Malignant neoplasm of upper-outer quadrant of left female breast: Secondary | ICD-10-CM | POA: Diagnosis not present

## 2024-08-30 NOTE — Telephone Encounter (Signed)
 Oral Oncology Patient Advocate Encounter  Application for PAP through [facility] for [place] has been sent in  after receiving both patient and Dr. catherin.

## 2024-08-31 ENCOUNTER — Ambulatory Visit (INDEPENDENT_AMBULATORY_CARE_PROVIDER_SITE_OTHER): Admitting: Family

## 2024-08-31 ENCOUNTER — Ambulatory Visit: Attending: Family

## 2024-08-31 ENCOUNTER — Encounter: Payer: Self-pay | Admitting: Family

## 2024-08-31 VITALS — BP 116/82 | HR 73 | Temp 98.2°F | Wt 119.0 lb

## 2024-08-31 DIAGNOSIS — I499 Cardiac arrhythmia, unspecified: Secondary | ICD-10-CM | POA: Diagnosis not present

## 2024-08-31 DIAGNOSIS — R1010 Upper abdominal pain, unspecified: Secondary | ICD-10-CM

## 2024-08-31 DIAGNOSIS — R9431 Abnormal electrocardiogram [ECG] [EKG]: Secondary | ICD-10-CM | POA: Diagnosis not present

## 2024-08-31 LAB — LIPASE: Lipase: 7 U/L — ABNORMAL LOW (ref 11.0–59.0)

## 2024-08-31 LAB — COMPREHENSIVE METABOLIC PANEL WITH GFR
ALT: 26 U/L (ref 0–35)
AST: 21 U/L (ref 0–37)
Albumin: 4.3 g/dL (ref 3.5–5.2)
Alkaline Phosphatase: 64 U/L (ref 39–117)
BUN: 19 mg/dL (ref 6–23)
CO2: 30 meq/L (ref 19–32)
Calcium: 9.2 mg/dL (ref 8.4–10.5)
Chloride: 103 meq/L (ref 96–112)
Creatinine, Ser: 0.75 mg/dL (ref 0.40–1.20)
GFR: 79.29 mL/min (ref >60.00)
Glucose, Bld: 84 mg/dL (ref 70–99)
Potassium: 4.3 meq/L (ref 3.5–5.1)
Sodium: 139 meq/L (ref 135–145)
Total Bilirubin: 0.7 mg/dL (ref 0.2–1.2)
Total Protein: 6.3 g/dL (ref 6.0–8.3)

## 2024-08-31 LAB — TSH: TSH: 0.91 u[IU]/mL (ref 0.35–5.50)

## 2024-08-31 LAB — AMYLASE: Amylase: 40 U/L (ref 27–131)

## 2024-08-31 NOTE — Progress Notes (Unsigned)
 Established Patient Office Visit  Subjective:      CC:  Chief Complaint  Patient presents with   Medical Management of Chronic Issues    Pt has some concerns about her medication and would like to discuss it.    HPI: Kathy Bishop is a 73 y.o. female presenting on 08/31/2024 for Medical Management of Chronic Issues (Pt has some concerns about her medication and would like to discuss it.) .  Discussed the use of AI scribe software for clinical note transcription with the patient, who gave verbal consent to proceed.  History of Present Illness Kathy Bishop is a 73 year old female with metastatic carcinoma who presents with persistent nausea.  She has experienced persistent nausea since late October, leading to emergency room visits on October 25th and 27th. A CT scan and X-ray were performed, but the cause of her symptoms was not identified. She was prescribed Zofran , which was ineffective, and was advised to follow up with oncology. She was also given Pepcid  and sucralfate , which she tried briefly without significant relief. She continues to take Pepcid  at night, which has slightly reduced the severity of her nausea.  Her nausea is localized to her stomach and gut, without associated heartburn. She has been consuming bland foods to avoid exacerbating her symptoms and has stopped drinking coffee. She has not started Verzenio  yet, pending insurance approval, and is currently on letrozole , which she began in late October. She has not identified specific food triggers, as she does not consume beef or pork.  Her past medical history includes metastatic carcinoma with hypermetabolic liver lesions and soft tissue density in the left axilla and clavicle. A PET scan and liver biopsy confirmed metastatic carcinoma compatible with breast cancer. She has been on letrozole  since late October and is awaiting the start of Verzenio . She does not report abdominal pain.  She has a history of  hypertension, which was elevated during her ER visit but has stabilized with amlodipine . No chest pain or palpitations, but she experiences anxiety, which can cause sensations of her heart skipping beats. She has not seen a cardiologist and has no known history of atrial fibrillation.  She has not had a bowel movement since Monday and feels gassy. She previously discontinued Linzess  due to concerns about side effects but is considering restarting it to address constipation. She has been taking ibuprofen for back pain, which she recently stopped, and is aware that it can irritate the stomach lining.         Social history:  Relevant past medical, surgical, family and social history reviewed and updated as indicated. Interim medical history since our last visit reviewed.  Allergies and medications reviewed and updated.  DATA REVIEWED: CHART IN EPIC     ROS: Negative unless specifically indicated above in HPI.    Current Outpatient Medications:    amLODipine  (NORVASC ) 5 MG tablet, Take 1 tablet (5 mg total) by mouth daily., Disp: 90 tablet, Rfl: 1   escitalopram  (LEXAPRO ) 10 MG tablet, Take 1 tablet (10 mg total) by mouth daily. (Patient taking differently: Take 10 mg by mouth daily. 30 mg total (20 mg and 10 mg)), Disp: 90 tablet, Rfl: 0   escitalopram  (LEXAPRO ) 20 MG tablet, Take 1 tablet (20 mg total) by mouth daily. (Patient taking differently: Take 20 mg by mouth daily. 30 mg total (20 mg and 10 mg)), Disp: 90 tablet, Rfl: 1   famotidine  (PEPCID ) 20 MG tablet, Take 1 tablet (20 mg total) by mouth  at bedtime., Disp: 30 tablet, Rfl: 0   letrozole  (FEMARA ) 2.5 MG tablet, Take 1 tablet (2.5 mg total) by mouth daily., Disp: 90 tablet, Rfl: 3   ondansetron  (ZOFRAN -ODT) 4 MG disintegrating tablet, Take 1 tablet (4 mg total) by mouth every 8 (eight) hours as needed for nausea or vomiting., Disp: 20 tablet, Rfl: 0   promethazine  (PHENERGAN ) 25 MG suppository, Place 1 suppository (25 mg  total) rectally every 6 (six) hours as needed for nausea or vomiting., Disp: 12 each, Rfl: 0   UNKNOWN TO PATIENT, Fiber Suppliment, Disp: , Rfl:    zolpidem  (AMBIEN ) 5 MG tablet, Take 1 tablet (5 mg total) by mouth at bedtime as needed for sleep., Disp: 30 tablet, Rfl: 0   abemaciclib  (VERZENIO ) 100 MG tablet, Take 1 tablet (100 mg total) by mouth 2 (two) times daily. (Patient not taking: Reported on 08/31/2024), Disp: 60 tablet, Rfl: 6        Objective:        BP 116/82 (BP Location: Left Arm, Patient Position: Sitting, Cuff Size: Normal)   Pulse 73   Temp 98.2 F (36.8 C) (Temporal)   Wt 119 lb (54 kg)   SpO2 98%   BMI 21.08 kg/m   Physical Exam VITALS: P- 60 CARDIOVASCULAR: EKG shows sinus rhythm, short PR interval, slight rate variation. Occasional skipped beats heard. ABDOMEN: Tender on deep palpation.  Wt Readings from Last 3 Encounters:  08/31/24 119 lb (54 kg)  08/30/24 119 lb (54 kg)  08/23/24 119 lb 3.2 oz (54.1 kg)    Physical Exam Constitutional:      General: She is not in acute distress.    Appearance: Normal appearance. She is normal weight. She is not ill-appearing, toxic-appearing or diaphoretic.  HENT:     Head: Normocephalic.  Cardiovascular:     Rate and Rhythm: Normal rate. Rhythm irregular. Occasional Extrasystoles are present. Pulmonary:     Effort: Pulmonary effort is normal.  Abdominal:     General: Bowel sounds are increased.     Tenderness: There is abdominal tenderness in the right upper quadrant, epigastric area and left upper quadrant.     Comments: Mild tenderness llq   Musculoskeletal:        General: Normal range of motion.  Neurological:     General: No focal deficit present.     Mental Status: She is alert and oriented to person, place, and time. Mental status is at baseline.  Psychiatric:        Mood and Affect: Mood normal.        Behavior: Behavior normal.        Thought Content: Thought content normal.        Judgment:  Judgment normal.          Results LABS Blood work: No leukocytosis, no anemia, no elevation of liver enzymes, mildly elevated total bilirubin, no acute renal insufficiency, no significant electrolyte abnormalities (08/06/2024)  RADIOLOGY CT abdomen and pelvis: Metastatic disease likely from uterus (08/06/2024) PET CT scan: Hypermetabolic liver lesions, left axilla soft tissue density, hypermetabolic activity concerning for active malignancy (08/20/2024)  DIAGNOSTIC EKG: Normal sinus rhythm, heart rate 72, normal QRS, QTc 446 ms, nonspecific ST segments and T waves, no ectopy (08/06/2024) EKG: Sinus rhythm, short PR interval, heart rate 60, nonspecific T-wave inversion in V1, V2, V3, no ST changes (08/31/2024)  PATHOLOGY Ultrasound-guided liver biopsy: Metastatic carcinoma compatible with breast, ER 99%, PR 2%, HER2 positive, FISH negative (08/16/2024)  Assessment & Plan:  Assessment and Plan Assessment & Plan Metastatic breast cancer with liver and axillary involvement Confirmed by PET CT scan showing hypermetabolic liver lesions and left axilla soft tissue density. Currently on letrozole , awaiting insurance approval for Verzenio . No symptoms of cancer prior to CT scan discovery. Mildly elevated bilirubin likely due to liver involvement. - Continue f/u with oncology as scheduled  Nausea and constipation Persistent nausea since October, not relieved by Zofran . Possible silent reflux considered. Constipation noted, with last bowel movement on Monday. Linzess  previously stopped due to side effect concerns, but not the cause of nausea. Sucralfate  and Pepcid  trialed with limited success. Ibuprofen use may contribute to nausea. No abdominal pain reported. - Restart Linzess  for constipation - Continue Pepcid  - Try sucralfate  for at least two weeks - Avoid spicy, fried, fatty foods, caffeine, chocolate, and tomato-based sauces   Cardiac arrhythmia with abnormal electrocardiogram EKG  shows sinus rhythm with short PR interval and occasional skipped beats. No chest pain or palpitations reported. Potential for atrial fibrillation considered. No prior cardiology consultation. - Ordered heart monitor for two weeks - Referred to cardiologist for further evaluation - Checked labs for anemia and thyroid  function  Hypertension Previously elevated blood pressure, now well-controlled with amlodipine . Blood pressure readings in the 120s. - Continue amlodipine  - Monitor blood pressure regularly        Return in about 3 months (around 12/01/2024) for f/u nausea .     Ginger Patrick, MSN, APRN, FNP-C [facility]

## 2024-09-03 ENCOUNTER — Inpatient Hospital Stay: Admitting: Hematology and Oncology

## 2024-09-04 NOTE — Telephone Encounter (Signed)
 Oral Oncology Patient Advocate Encounter  Patient has been approved through [employer] for Verzenio      Dates: 09/04/2024- 10/10/2025   [facility] will be filling and delivering the medication   Notified patient via Voicemail    Charlott Hamilton,  CPhT-Adv  she/her/hers [facility]  [facility] Pharmacy Technician Patient Advocate Specialist III [facility] Phone: [phone]  Fax: [phone] [email or web address]

## 2024-09-09 ENCOUNTER — Other Ambulatory Visit: Payer: Self-pay | Admitting: Hematology and Oncology

## 2024-09-10 ENCOUNTER — Encounter: Payer: Self-pay | Admitting: Family

## 2024-09-10 ENCOUNTER — Other Ambulatory Visit: Payer: Self-pay | Admitting: Hematology and Oncology

## 2024-09-10 ENCOUNTER — Ambulatory Visit: Payer: Self-pay | Admitting: Family

## 2024-09-10 DIAGNOSIS — R12 Heartburn: Secondary | ICD-10-CM

## 2024-09-10 DIAGNOSIS — R110 Nausea: Secondary | ICD-10-CM

## 2024-09-10 MED ORDER — ZOLPIDEM TARTRATE 5 MG PO TABS: 5.0000 mg | ORAL_TABLET | Freq: Every evening | ORAL | 3 refills | Status: AC | PRN

## 2024-09-11 ENCOUNTER — Other Ambulatory Visit: Payer: Self-pay | Admitting: Pharmacist

## 2024-09-11 ENCOUNTER — Other Ambulatory Visit (HOSPITAL_BASED_OUTPATIENT_CLINIC_OR_DEPARTMENT_OTHER): Payer: Self-pay

## 2024-09-11 MED ORDER — ABEMACICLIB 100 MG PO TABS: 100.0000 mg | ORAL_TABLET | Freq: Two times a day (BID) | ORAL | 0 refills | Status: DC

## 2024-09-11 MED ORDER — PROMETHAZINE HCL 25 MG RE SUPP
25.0000 mg | Freq: Four times a day (QID) | RECTAL | 0 refills | Status: DC | PRN
  Filled 2024-09-11: qty 12, 3d supply, fill #0

## 2024-09-11 MED ORDER — FAMOTIDINE 20 MG PO TABS
20.0000 mg | ORAL_TABLET | Freq: Every day | ORAL | 0 refills | Status: DC
  Filled 2024-09-11: qty 30, 30d supply, fill #0

## 2024-09-11 NOTE — Telephone Encounter (Signed)
 Oral Oncology Patient Advocate Encounter   Patient expected to receive Verzenio  from [facility] 09/12/2024 via [employer]

## 2024-09-11 NOTE — Telephone Encounter (Signed)
 Copied from CRM #[id number]. Topic: General - Other >> Sep 11, 2024 10:58 AM Jasmin G wrote: Reason for CRM: Pt requested a call back from Ms. Dugal's team regarding recent message sent on 12/1, please refer to recent Encounters for more info.

## 2024-09-12 ENCOUNTER — Other Ambulatory Visit (HOSPITAL_BASED_OUTPATIENT_CLINIC_OR_DEPARTMENT_OTHER): Payer: Self-pay

## 2024-09-12 NOTE — Addendum Note (Signed)
 Addended by: CORWIN ANTU on: 09/12/2024 10:42 AM   Modules accepted: Orders

## 2024-09-17 ENCOUNTER — Inpatient Hospital Stay: Admitting: Hematology and Oncology

## 2024-09-17 ENCOUNTER — Inpatient Hospital Stay

## 2024-09-17 NOTE — Progress Notes (Unsigned)
  Cardiology Office Note   Date:  09/18/2024  ID:  Kathy Bishop, DOB 1951/06/30, MRN [id number] PCP: Corwin Antu, FNP  [facility] Providers Cardiologist:  Caron Poser, MD     History of Present Illness Kathy Bishop is a 73 y.o. female PMH HTN, metastatic breast cancer who presents for further evaluation management of a short PR interval and possible arrhythmia.  Patient seen by PCP for this issue 08/31/2024.  ECG at that time was noted to have a short PR interval.  Monitor was ordered.  TSH was normal.  Last LDL 87 03/2022.  Patient reports she is overall feeling well.  She denies any cardiovascular symptoms - no dyspnea, palpitations, syncope, chest discomfort, orthopnea, or anything else concerning.  She is currently wearing the monitor her PCP ordered.  Since she is otherwise doing well, we discussed obtaining further testing with an echocardiogram versus watchful waiting.  She politely declined any further testing and would like to just do watchful waiting for now.  Relevant CVD History -Monitor pending   ROS: Pt denies any chest discomfort, jaw pain, arm pain, palpitations, syncope, presyncope, orthopnea, PND, or LE edema.  Studies Reviewed I have independently reviewed the patient's ECG, previous medical record, previous blood work.  Physical Exam VS:  BP 120/72 (BP Location: Left Arm, Patient Position: Sitting, Cuff Size: Normal)   Pulse (!) 56   Ht 5' 3 (1.6 m)   Wt 116 lb (52.6 kg)   SpO2 97%   BMI 20.55 kg/m        Wt Readings from Last 3 Encounters:  09/18/24 116 lb (52.6 kg)  08/31/24 119 lb (54 kg)  08/30/24 119 lb (54 kg)    GEN: No acute distress. NECK: No JVD; No carotid bruits. CARDIAC: RRR, no murmurs, rubs, gallops. RESPIRATORY:  Clear to auscultation. EXTREMITIES:  Warm and well-perfused. No edema.  ASSESSMENT AND PLAN Short PR interval Abnormal ECG ECG in office today shows sinus bradycardia with sinus arrhythmia.  No high risk  features.  She denies any current symptoms - no syncope, palpitations, dyspnea, chest pain, or anything else concerning.  I discussed obtaining further testing with an echocardiogram versus a watchful waiting approach; she notes she has had many medical visits recently and would prefer to just go for a watchful waiting approach for now.  I discussed that if her monitor shows significant abnormalities, then the recommendation would be to pursue an echocardiogram which she is agreeable.  Per her request, we will hold off for now.        Dispo: RTC as needed  Signed, Caron Poser, MD

## 2024-09-18 ENCOUNTER — Encounter: Payer: Self-pay | Admitting: Pharmacist

## 2024-09-18 ENCOUNTER — Ambulatory Visit

## 2024-09-18 VITALS — BP 120/72 | HR 56 | Ht 63.0 in | Wt 116.0 lb

## 2024-09-18 DIAGNOSIS — R9431 Abnormal electrocardiogram [ECG] [EKG]: Secondary | ICD-10-CM | POA: Diagnosis not present

## 2024-09-18 DIAGNOSIS — I479 Paroxysmal tachycardia, unspecified: Secondary | ICD-10-CM

## 2024-09-18 DIAGNOSIS — R002 Palpitations: Secondary | ICD-10-CM

## 2024-09-18 NOTE — Patient Instructions (Signed)
 Medication Instructions:   Your physician recommends that you continue on your current medications as directed. Please refer to the Current Medication list given to you today.   *If you need a refill on your cardiac medications before your next appointment, please call your pharmacy*  Lab Work:  No labs ordered today  If you have labs (blood work) drawn today and your tests are completely normal, you will receive your results only by: MyChart Message (if you have MyChart) OR A paper copy in the mail If you have any lab test that is abnormal or we need to change your treatment, we will call you to review the results.  Testing/Procedures:  No test ordered today   Follow-Up:   At [facility], you and your health needs are our priority.  As part of our continuing mission to provide you with exceptional heart care, our providers are all part of one team.  This team includes your primary Cardiologist (physician) and Advanced Practice Providers or APPs (Physician Assistants and Nurse Practitioners) who all work together to provide you with the care you need, when you need it.  Your next appointment:   As Needed  Provider:   You may see Caron Poser, MD or one of the following Advanced Practice Providers on your designated Care Team:   Lonni Meager, NP Lesley Maffucci, PA-C Bernardino Bring, PA-C Cadence Boqueron, PA-C Tylene Lunch, NP Barnie Hila, NP   We recommend signing up for the patient portal called MyChart.  Sign up information is provided on this After Visit Summary.  MyChart is used to connect with patients for Virtual Visits (Telemedicine).  Patients are able to view lab/test results, encounter notes, upcoming appointments, etc.  Non-urgent messages can be sent to your provider as well.   To learn more about what you can do with MyChart, go to [email or web address].

## 2024-09-21 ENCOUNTER — Other Ambulatory Visit: Payer: Self-pay | Admitting: Pharmacist

## 2024-09-24 ENCOUNTER — Encounter: Payer: Self-pay | Admitting: Family

## 2024-09-24 DIAGNOSIS — R110 Nausea: Secondary | ICD-10-CM

## 2024-09-24 MED ORDER — PROMETHAZINE HCL 25 MG RE SUPP: 25.0000 mg | Freq: Four times a day (QID) | RECTAL | 0 refills | Status: DC | PRN

## 2024-09-26 ENCOUNTER — Inpatient Hospital Stay: Admitting: Hematology and Oncology

## 2024-09-26 ENCOUNTER — Inpatient Hospital Stay: Attending: Hematology and Oncology

## 2024-09-26 VITALS — BP 112/67 | HR 93 | Temp 98.0°F | Resp 18 | Ht 63.0 in | Wt 117.3 lb

## 2024-09-26 DIAGNOSIS — C7951 Secondary malignant neoplasm of bone: Secondary | ICD-10-CM | POA: Diagnosis not present

## 2024-09-26 DIAGNOSIS — Z170 Estrogen receptor positive status [ER+]: Secondary | ICD-10-CM

## 2024-09-26 DIAGNOSIS — Z923 Personal history of irradiation: Secondary | ICD-10-CM | POA: Insufficient documentation

## 2024-09-26 DIAGNOSIS — Z1721 Progesterone receptor positive status: Secondary | ICD-10-CM | POA: Diagnosis not present

## 2024-09-26 DIAGNOSIS — R300 Dysuria: Secondary | ICD-10-CM | POA: Diagnosis not present

## 2024-09-26 DIAGNOSIS — C50412 Malignant neoplasm of upper-outer quadrant of left female breast: Secondary | ICD-10-CM | POA: Diagnosis not present

## 2024-09-26 DIAGNOSIS — T451X5A Adverse effect of antineoplastic and immunosuppressive drugs, initial encounter: Secondary | ICD-10-CM | POA: Diagnosis not present

## 2024-09-26 DIAGNOSIS — R197 Diarrhea, unspecified: Secondary | ICD-10-CM | POA: Insufficient documentation

## 2024-09-26 DIAGNOSIS — D759 Disease of blood and blood-forming organs, unspecified: Secondary | ICD-10-CM | POA: Insufficient documentation

## 2024-09-26 DIAGNOSIS — Z801 Family history of malignant neoplasm of trachea, bronchus and lung: Secondary | ICD-10-CM | POA: Diagnosis not present

## 2024-09-26 DIAGNOSIS — Z87891 Personal history of nicotine dependence: Secondary | ICD-10-CM | POA: Diagnosis not present

## 2024-09-26 DIAGNOSIS — R600 Localized edema: Secondary | ICD-10-CM | POA: Diagnosis not present

## 2024-09-26 DIAGNOSIS — Z1732 Human epidermal growth factor receptor 2 negative status: Secondary | ICD-10-CM | POA: Diagnosis not present

## 2024-09-26 DIAGNOSIS — Z79811 Long term (current) use of aromatase inhibitors: Secondary | ICD-10-CM | POA: Insufficient documentation

## 2024-09-26 DIAGNOSIS — R110 Nausea: Secondary | ICD-10-CM

## 2024-09-26 LAB — CBC WITH DIFFERENTIAL (CANCER CENTER ONLY)
Abs Immature Granulocytes: 0.01 K/uL (ref 0.00–0.07)
Basophils Absolute: 0 K/uL (ref 0.0–0.1)
Basophils Relative: 1 %
Eosinophils Absolute: 0.2 K/uL (ref 0.0–0.5)
Eosinophils Relative: 6 %
HCT: 35.2 % — ABNORMAL LOW (ref 36.0–46.0)
Hemoglobin: 12 g/dL (ref 12.0–15.0)
Immature Granulocytes: 0 %
Lymphocytes Relative: 35 %
Lymphs Abs: 0.8 K/uL (ref 0.7–4.0)
MCH: 32.1 pg (ref 26.0–34.0)
MCHC: 34.1 g/dL (ref 30.0–36.0)
MCV: 94.1 fL (ref 80.0–100.0)
Monocytes Absolute: 0.1 K/uL (ref 0.1–1.0)
Monocytes Relative: 4 %
Neutro Abs: 1.3 K/uL — ABNORMAL LOW (ref 1.7–7.7)
Neutrophils Relative %: 54 %
Platelet Count: 99 K/uL — ABNORMAL LOW (ref 150–400)
RBC: 3.74 MIL/uL — ABNORMAL LOW (ref 3.87–5.11)
RDW: 11.9 % (ref 11.5–15.5)
Smear Review: NORMAL
WBC Count: 2.4 K/uL — ABNORMAL LOW (ref 4.0–10.5)
nRBC: 0 % (ref 0.0–0.2)

## 2024-09-26 LAB — CMP (CANCER CENTER ONLY)
ALT: 16 U/L (ref 0–44)
AST: 21 U/L (ref 15–41)
Albumin: 4 g/dL (ref 3.5–5.0)
Alkaline Phosphatase: 67 U/L (ref 38–126)
Anion gap: 8 (ref 5–15)
BUN: 15 mg/dL (ref 8–23)
CO2: 26 mmol/L (ref 22–32)
Calcium: 9.1 mg/dL (ref 8.9–10.3)
Chloride: 107 mmol/L (ref 98–111)
Creatinine: 1.02 mg/dL — ABNORMAL HIGH (ref 0.44–1.00)
GFR, Estimated: 58 mL/min — ABNORMAL LOW (ref >60)
Glucose, Bld: 92 mg/dL (ref 70–99)
Potassium: 3.9 mmol/L (ref 3.5–5.1)
Sodium: 141 mmol/L (ref 135–145)
Total Bilirubin: 0.5 mg/dL (ref 0.0–1.2)
Total Protein: 6.3 g/dL — ABNORMAL LOW (ref 6.5–8.1)

## 2024-09-26 MED ORDER — PROMETHAZINE HCL 25 MG RE SUPP: 25.0000 mg | Freq: Three times a day (TID) | RECTAL | 3 refills | Status: DC | PRN

## 2024-09-26 MED ORDER — PANTOPRAZOLE SODIUM 40 MG PO TBEC: 40.0000 mg | DELAYED_RELEASE_TABLET | Freq: Every day | ORAL | 3 refills | Status: AC

## 2024-09-26 NOTE — Progress Notes (Signed)
 Patient Care Team: Corwin Antu, FNP as PCP - General (Family Medicine) Argentina Clap, MD as PCP - Cardiology (Cardiology) Vernetta Berg, MD as Consulting Physician (General Surgery) Izell Domino, MD as Attending Physician (Radiation Oncology) Crawford Morna Pickle, NP as Nurse Practitioner (Hematology and Oncology)  DIAGNOSIS:  Encounter Diagnosis  Name Primary?   Malignant neoplasm of upper-outer quadrant of left breast in female, estrogen receptor positive (HCC) Yes    SUMMARY OF ONCOLOGIC HISTORY: Oncology History Overview Note  3 1/   Malignant neoplasm of upper-outer quadrant of left breast in female, estrogen receptor positive (HCC)  08/20/2016 Initial Biopsy   Left breast, needle core biopsy, 2 o'clock, 8cmfn: IDC, DCIS with necrosis, grade 2, ER+(95%), PR+(100%), Ki-67 15%, HER-2 negative (ratio 0.78). Left breast, needle core biopsy, 2 o'clock, 5cmfn: IDC, grade 2, ER+(95%), PR+(100%), Ki-67 10%, HER-2 negative (ratio 1.06).     09/10/2016 - 09/2021 Anti-estrogen oral therapy   Anastrozole  daily bone density 12/08/2016 showed a T score of -0.2 (normal).   11/09/2016 Surgery   Left breast lumpectomy Jan): IDC with calcifications, Grade II/III, two foci, 3cm and 0.6 cm, DCIS with calcifications, +lymphovascular invasion, 1 SLN negative, T2, N0   11/09/2016 Oncotype testing   25/17% intermediate risk   12/20/2016 - 01/17/2017 Radiation Therapy   Left Breast treated to 40.05 Gy in 15 fx of 2.67 Gy/fx  and a 10 Gy boost in 5 fx of 2 Gy/fx      CHIEF COMPLIANT: Follow-up on Verzenio  and letrozole   HISTORY OF PRESENT ILLNESS:   History of Present Illness Kathy Bishop is a 73 year old female with metastatic estrogen receptor-positive, HER2-negative invasive ductal carcinoma of the left breast who presents with persistent nausea and abdominal pain during systemic therapy.  She has had constant, unrelenting daily nausea since August 04, 2024, severe  enough to prompt an emergency room visit. She also has progressive, unresolved abdominal pain localized to the gut. She has no significant diarrhea, with only mild episodes controlled with intermittent Imodium. Constipation is present as a medication side effect.  Current supportive medications include promethazine  twice daily, which provides only partial and temporary relief, and Pepcid , which was stopped for constipation and then resumed without benefit. Her nausea began before these medications.  She is scheduled to see gastroenterology next week. Labs today show normal liver enzymes, creatinine, protein, and electrolytes, with mild cytopenia. , Complaining of abdominal pain     ALLERGIES:  is allergic to buspirone .  MEDICATIONS:  Current Outpatient Medications  Medication Sig Dispense Refill   abemaciclib  (VERZENIO ) 100 MG tablet Take 1 tablet (100 mg total) by mouth 2 (two) times daily. 168 tablet 0   amLODipine  (NORVASC ) 5 MG tablet Take 1 tablet (5 mg total) by mouth daily. 90 tablet 1   escitalopram  (LEXAPRO ) 10 MG tablet Take 1 tablet (10 mg total) by mouth daily. 90 tablet 0   escitalopram  (LEXAPRO ) 20 MG tablet Take 1 tablet (20 mg total) by mouth daily. 90 tablet 1   famotidine  (PEPCID ) 20 MG tablet Take 1 tablet (20 mg total) by mouth at bedtime. 30 tablet 0   letrozole  (FEMARA ) 2.5 MG tablet Take 1 tablet (2.5 mg total) by mouth daily. 90 tablet 3   promethazine  (PHENERGAN ) 25 MG suppository Place 1 suppository (25 mg total) rectally every 6 (six) hours as needed for nausea or vomiting. 12 each 0   sucralfate  (CARAFATE ) 1 g tablet      UNKNOWN TO PATIENT Fiber Suppliment     zolpidem  (  AMBIEN ) 5 MG tablet Take 1 tablet (5 mg total) by mouth at bedtime as needed for sleep. 30 tablet 3   No current facility-administered medications for this visit.    PHYSICAL EXAMINATION: ECOG PERFORMANCE STATUS: 1 - Symptomatic but completely ambulatory  Vitals:   09/26/24 1030  BP:  112/67  Pulse: 93  Resp: 18  Temp: 98 F (36.7 C)  SpO2: 98%   Filed Weights   09/26/24 1030  Weight: 117 lb 4.8 oz (53.2 kg)     LABORATORY DATA:  I have reviewed the data as listed    Latest Ref Rng & Units 08/31/2024   10:23 AM 08/06/2024    3:45 PM 08/04/2024   10:44 AM  CMP  Glucose 70 - 99 mg/dL 84  889  869   BUN 6 - 23 mg/dL 19  15  14    Creatinine 0.40 - 1.20 mg/dL 9.24  9.24  9.20   Sodium 135 - 145 mEq/L 139  135  139   Potassium 3.5 - 5.1 mEq/L 4.3  3.6  3.9   Chloride 96 - 112 mEq/L 103  98  102   CO2 19 - 32 mEq/L 30  24  23    Calcium 8.4 - 10.5 mg/dL 9.2  9.3  89.8   Total Protein 6.0 - 8.3 g/dL 6.3  7.9  7.2   Total Bilirubin 0.2 - 1.2 mg/dL 0.7  1.5  0.9   Alkaline Phos 39 - 117 U/L 64  62  81   AST 0 - 37 U/L 21  30  31    ALT 0 - 35 U/L 26  27  24      Lab Results  Component Value Date   WBC 2.4 (L) 09/26/2024   HGB 12.0 09/26/2024   HCT 35.2 (L) 09/26/2024   MCV 94.1 09/26/2024   PLT 99 (L) 09/26/2024   NEUTROABS PENDING 09/26/2024    ASSESSMENT & PLAN:  Malignant neoplasm of upper-outer quadrant of left breast in female, estrogen receptor positive (HCC) 08/20/2016:Left breast, needle core biopsy, 2 o'clock, 8cmfn: IDC, DCIS with necrosis, grade 2, ER+(95%), PR+(100%), Ki-67 15%, HER-2 negative (ratio 0.78). Left breast, needle core biopsy, 2 o'clock, 5cmfn: IDC, grade 2, ER+(95%), PR+(100%), Ki-67 10%, HER-2 negative (ratio 1.06).     11/09/2016:Left breast lumpectomy Jan): IDC with calcifications, Grade II/III, two foci, 3cm and 0.6 cm, DCIS with calcifications, +lymphovascular invasion, 1 SLN negative, T2, N0 Oncotype DX score 25: 17% risk Adjuvant radiation completed 01/17/2017 Adjuvant anastrozole  completed December 2022 ---------------------------------------------------------------------------------------------------------------------- CT abdomen pelvis 08/04/2024: Multiple liver metastases (2.4 cm, 1.1 cm, 2.3 cm), multiple bone  metastases: L3 1 cm, endometrial thickening   Recommendation: Ultrasound-guided liver biopsy 08/16/2024: Metastatic carcinoma compatible with breast, ER 99%, PR 2%, Ki-67 5%, HER2 2+ by IHC, FISH negative ratio 1.39 PET CT scan 08/20/2024: Hypermetabolic liver lesions, left axilla soft tissue density hypermetabolic activity concerning for active malignancy, focus of activity left upper sternal body Recommendation: Letrozole  with Verzinio.  Start 08/13/2024 Monitor tumor markers CA 15-3 and CEA 2729   Abemaciclib  toxicities: Denies any significant diarrhea Decreased blood counts: Monitoring closely  Nausea: I sent a prescription for Protonix .  She is going to see gastroenterologist. Return to clinic in 1 month for labs and follow-up   Assessment & Plan Nausea Persistent, refractory nausea with unclear etiology. Differential includes gastritis, peptic ulcer disease, gallbladder pathology, or medication side effect. Partial relief with promethazine . GI symptoms may relate to malignancy, therapy, or supportive medications. - Prescribed pantoprazole   for gastric protection and symptom relief. - Discontinued famotidine . - Increased promethazine  quantity and refills for symptomatic management. - Referred to gastroenterology for further evaluation. - Continued supportive medications (ondansetron , sucralfate , linaclotide ) as needed for GI symptoms. - Monitor for new or worsening GI symptoms, particularly with abemaciclib  initiation.  Estrogen receptor positive malignant neoplasm of upper-outer quadrant of left breast Metastatic, recurrent ER+ breast carcinoma with liver, bone, and left axillary metastases. Prognosis poor; management palliative. No acute oncologic complications. - Continued letrozole ; monitored for adverse effects. - Planned to initiate abemaciclib ; will monitor for neutropenia, gastrointestinal toxicity, pneumonia, and thromboembolism. Will reduce dose or hold therapy if neutropenia  or severe GI toxicity occurs. - Ordered monthly laboratory monitoring: CBC, liver enzymes, electrolytes, and tumor markers (CA 15-3, CEA 2729). - Ordered PET CT every 3 months to assess disease status. - Scheduled monthly clinical follow-up for assessment of adverse effects and dose adjustments. - Monitored bone density, managed endocrine therapy side effects, and provided supportive care for GI and constitutional symptoms.  Chemotherapy-induced cytopenia Mild cytopenia with declining blood counts, not requiring dose reduction. Ongoing monitoring necessary, especially with planned abemaciclib . - Scheduled repeat laboratory testing in one month. - Will monitor for further decline in blood counts with abemaciclib ; will reduce dose or hold therapy if significant neutropenia or thrombocytopenia develops. - Discussed possible dose adjustment if cytopenia worsens at next visit. - Monitor for symptoms of infection or bleeding.      No orders of the defined types were placed in this encounter.  The patient has a good understanding of the overall plan. she agrees with it. she will call with any problems that may develop before the next visit here.  I personally spent a total of 30 minutes in the care of the patient today including preparing to see the patient, getting/reviewing separately obtained history, performing a medically appropriate exam/evaluation, counseling and educating, placing orders, referring and communicating with other health care professionals, documenting clinical information in the EHR, independently interpreting results, communicating results, and coordinating care.   Viinay K Larah Kuntzman, MD 09/26/2024

## 2024-09-26 NOTE — Assessment & Plan Note (Signed)
 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 Left breast, needle core biopsy, 2 o'clock, 5cmfn: IDC, grade 2, ER+(95%), PR+(100%), Ki-67 10%, HER-2 negative (ratio 1.06).     11/09/2016:Left breast lumpectomy Jan): IDC with calcifications, Grade II/III, two foci, 3cm and 0.6 cm, DCIS with calcifications, +lymphovascular invasion, 1 SLN negative, T2, N0 Oncotype DX score 25: 17% risk Adjuvant radiation completed 01/17/2017 Adjuvant anastrozole  completed December 2022 ---------------------------------------------------------------------------------------------------------------------- CT abdomen pelvis 08/04/2024: Multiple liver metastases (2.4 cm, 1.1 cm, 2.3 cm), multiple bone metastases: L3 1 cm, endometrial thickening   Recommendation: Ultrasound-guided liver biopsy 08/16/2024: Metastatic carcinoma compatible with breast, ER 99%, PR 2%, Ki-67 5%, HER2 2+ by IHC, FISH negative ratio 1.39 PET CT scan 08/20/2024: Hypermetabolic liver lesions, left axilla soft tissue density hypermetabolic activity concerning for active malignancy, focus of activity left upper sternal body Recommendation: Letrozole  with Verzinio. Monitor tumor markers CA 15-3 and CEA 2729   Abemaciclib  toxicities:  Return to clinic in 1 month for labs and follow-up with Norleen

## 2024-09-27 LAB — CANCER ANTIGEN 27.29: CA 27.29: 70.2 U/mL — ABNORMAL HIGH (ref 0.0–38.6)

## 2024-09-27 LAB — CANCER ANTIGEN 15-3: CA 15-3: 68.8 U/mL — ABNORMAL HIGH (ref 0.0–25.0)

## 2024-09-30 DIAGNOSIS — I499 Cardiac arrhythmia, unspecified: Secondary | ICD-10-CM

## 2024-09-30 DIAGNOSIS — R9431 Abnormal electrocardiogram [ECG] [EKG]: Secondary | ICD-10-CM

## 2024-10-02 NOTE — Progress Notes (Signed)
 "   10/03/2024 Kathy Bishop [id number] 1951/02/16  Gastroenterology Office Note    Referring Provider: Corwin Antu, FNP Primary Care Physician:  Corwin Antu, FNP  Primary GI Provider: Jinny Carmine, MD     Chief Complaint   Chief Complaint  Patient presents with   New Patient (Initial Visit)    Heartburn-stated a few week ago-epigastric pain- diarrhea-and nausea-Pantoprazole  prescribed this past Wednesday-currently taking oral chemo for newly DX liver cancer takes imodium for diarrhea     History of Present Illness   Kathy Bishop is a 73 y.o. female with PMHX of breast cancer presenting today at the request of Dugal, Antu, FNP due to nausea and heartburn.   Patient reports that in October she started having intense nausea and was seen at urgent care and then 2 days later went to the emergency room for same issues.  She was given famotidine  to take at bedtime, promethazine , and sucralfate . She was having some vomiting at that time but that has resolved.   she was initially taking sucralfate  3 times a day but now only takes it twice but does not feel that it is beneficial.  Zofran  helps some.  Patient also with history of breast cancer and CT scan from emergency room visit did show liver and bone metastasis.  She has seen oncology and was started on Verzenio .  Her oncologist also prescribed pantoprazole  and she has been on that for 1 week, it has helped some.  She has been trying to eat a bland diet.  She has always generally had a healthy diet and denies eating fatty, fried, spicy foods.  She was taking NSAIDs a lot due to back pain and stopped after seeing her PCP back in October.  She has not consumed alcohol in about 2 months.   Patient was previously on Linzess  due to constipation but felt like it caused her to have more diarrhea so she stopped.  Occasionally she will have episodes of diarrhea but thinks it is more so now related to Verzenio  as that is a known side effect.  Will  take Imodium when she has diarrhea and it helps.   She has follow-up appointment with oncology on 10/25/2024.  Patient seen by oncology on 09/26/2024 and was prescribed Protonix  due to nausea.  Patient seen by PCP on 08/31/2024 with persistent nausea since last October, she continues to take Pepcid  at night which has slightly reduced the severity of her nausea.  Linzess  was restarted for constipation.  Patient seen at [facility] health emergency room on 08/06/2024 with 9 days of nausea and vomiting.  Promethazine  suppositories and sucralfate  were prescribed.  CT abdomen pelvis 08/04/2024: Multiple liver metastases (2.4 cm, 1.1 cm, 2.3 cm), multiple bone metastases: L3 1 cm, endometrial thickening   11/28/2020 upper endoscopy: Esophagus, stomach, duodenum normal.  Continue on omeprazole  20 mg once daily.  11/28/2020 colonoscopy - Internal hemorrhoids - Exam was otherwise normal on direct and retroflexion views. - No polyps or cancers - Repeat in 10 years for screening   Past Medical History:  Diagnosis Date   Anxiety    Breast cancer (HCC)    Depression    Dermatitis    allergic   Foot pain, right    History of radiation therapy 12/20/16- 01/17/17   Left Breast 40.05 Gy in 15 fractions and a 10 Gy boost in 5 fractions.    Malignant neoplasm of upper-outer quadrant of left breast in female, estrogen receptor positive (HCC) 08/31/2016   Need for prophylactic  hormone replacement therapy (postmenopausal)    Neuromuscular disorder (HCC)    Nocturia    Personal history of radiation therapy    Routine general medical examination at a health care facility    Symptomatic menopausal or female climacteric states     Past Surgical History:  Procedure Laterality Date   BREAST LUMPECTOMY Left    BREAST LUMPECTOMY WITH SENTINEL LYMPH NODE BIOPSY Left 11/09/2016   Procedure: LEFT BREAST LUMPECTOMY WITH SENTINEL LYMPH NODE BX;  Surgeon: Vicenta Poli, MD;  Location: [facility];   Service: General;  Laterality: Left;   EYE SURGERY  11/12/22   Removal of cataracts on both eyes   TONSILLECTOMY     age 37    Current Outpatient Medications  Medication Sig Dispense Refill   abemaciclib  (VERZENIO ) 100 MG tablet Take 1 tablet (100 mg total) by mouth 2 (two) times daily. 168 tablet 0   amLODipine  (NORVASC ) 5 MG tablet Take 1 tablet (5 mg total) by mouth daily. 90 tablet 1   escitalopram  (LEXAPRO ) 10 MG tablet Take 1 tablet (10 mg total) by mouth daily. 90 tablet 0   escitalopram  (LEXAPRO ) 20 MG tablet Take 1 tablet (20 mg total) by mouth daily. 90 tablet 1   letrozole  (FEMARA ) 2.5 MG tablet Take 1 tablet (2.5 mg total) by mouth daily. 90 tablet 3   pantoprazole  (PROTONIX ) 40 MG tablet Take 1 tablet (40 mg total) by mouth daily. 30 tablet 3   promethazine  (PHENERGAN ) 25 MG suppository Place 1 suppository (25 mg total) rectally every 8 (eight) hours as needed for nausea or vomiting. 60 each 3   sucralfate  (CARAFATE ) 1 g tablet      UNKNOWN TO PATIENT Fiber Suppliment     zolpidem  (AMBIEN ) 5 MG tablet Take 1 tablet (5 mg total) by mouth at bedtime as needed for sleep. 30 tablet 3   No current facility-administered medications for this visit.    Allergies as of 10/03/2024 - Review Complete 10/03/2024  Allergen Reaction Noted   Buspirone  Nausea Only 08/27/2022    Family History  Problem Relation Age of Onset   Alzheimer's disease Mother    Lung cancer Father        smoker   Alcohol abuse Father    Colon cancer Neg Hx    Esophageal cancer Neg Hx    Rectal cancer Neg Hx    Stomach cancer Neg Hx     Social History   Socioeconomic History   Marital status: Married    Spouse name: Not on file   Number of children: 0   Years of education: Not on file   Highest education level: Bachelor's degree (e.g., BA, AB, BS)  Occupational History   Occupation: retired  Tobacco Use   Smoking status: Former   Smokeless tobacco: Never   Tobacco comments:    Social smoker in  mid 20's.  Never addicted  Vaping Use   Vaping status: Never Used  Substance and Sexual Activity   Alcohol use: Yes    Alcohol/week: 2.0 standard drinks of alcohol    Types: 2 Glasses of wine per week   Drug use: No   Sexual activity: Not Currently    Birth control/protection: Abstinence  Other Topics Concern   Not on file  Social History Narrative   Not on file   Social Drivers of Health   Tobacco Use: Medium Risk (10/03/2024)   Patient History    Smoking Tobacco Use: Former    Smokeless Tobacco  Use: Never    Passive Exposure: Not on file  Financial Resource Strain: Low Risk (08/31/2023)   Overall Financial Resource Strain (CARDIA)    Difficulty of Paying Living Expenses: Not very hard  Food Insecurity: No Food Insecurity (08/31/2023)   Hunger Vital Sign    Worried About Running Out of Food in the Last Year: Never true    Ran Out of Food in the Last Year: Never true  Transportation Needs: No Transportation Needs (08/31/2023)   PRAPARE - Administrator, Civil Service (Medical): No    Lack of Transportation (Non-Medical): No  Physical Activity: Sufficiently Active (08/31/2023)   Exercise Vital Sign    Days of Exercise per Week: 6 days    Minutes of Exercise per Session: 120 min  Stress: Stress Concern Present (08/31/2023)   [employer] of Occupational Health - Occupational Stress Questionnaire    Feeling of Stress : Rather much  Social Connections: Socially Isolated (08/31/2023)   Social Connection and Isolation Panel    Frequency of Communication with Friends and Family: Once a week    Frequency of Social Gatherings with Friends and Family: Once a week    Attends Religious Services: Never    Database Administrator or Organizations: No    Attends Engineer, Structural: Not on file    Marital Status: Married  Intimate Partner Violence: Not on file  Depression (PHQ2-9): Medium Risk (03/29/2022)   Depression (PHQ2-9)    PHQ-2 Score: 8  Alcohol  Screen: Low Risk (08/31/2023)   Alcohol Screen    Last Alcohol Screening Score (AUDIT): 4  Housing: Low Risk (08/31/2023)   Housing    Last Housing Risk Score: 0  Utilities: Not on file  Health Literacy: Not on file     RELEVANT GI HISTORY, IMAGING AND LABS: CBC    Component Value Date/Time   WBC 2.4 (L) 09/26/2024 1024   WBC 3.9 (L) 08/16/2024 0909   RBC 3.74 (L) 09/26/2024 1024   HGB 12.0 09/26/2024 1024   HGB 13.8 10/12/2017 1302   HCT 35.2 (L) 09/26/2024 1024   HCT 41.1 10/12/2017 1302   PLT 99 (L) 09/26/2024 1024   PLT 210 10/12/2017 1302   MCV 94.1 09/26/2024 1024   MCV 96.3 10/12/2017 1302   MCH 32.1 09/26/2024 1024   MCHC 34.1 09/26/2024 1024   RDW 11.9 09/26/2024 1024   RDW 12.8 10/12/2017 1302   LYMPHSABS 0.8 09/26/2024 1024   LYMPHSABS 1.0 10/12/2017 1302   MONOABS 0.1 09/26/2024 1024   MONOABS 0.3 10/12/2017 1302   EOSABS 0.2 09/26/2024 1024   EOSABS 0.1 10/12/2017 1302   BASOSABS 0.0 09/26/2024 1024   BASOSABS 0.0 10/12/2017 1302   Recent Labs    08/04/24 1044 08/06/24 1545 08/16/24 0909 09/26/24 1024  HGB 14.6 15.1* 14.0 12.0    CMP     Component Value Date/Time   NA 141 09/26/2024 1024   NA 140 10/12/2017 1302   K 3.9 09/26/2024 1024   K 4.0 10/12/2017 1302   CL 107 09/26/2024 1024   CO2 26 09/26/2024 1024   CO2 27 10/12/2017 1302   GLUCOSE 92 09/26/2024 1024   GLUCOSE 101 10/12/2017 1302   BUN 15 09/26/2024 1024   BUN 11.4 10/12/2017 1302   CREATININE 1.02 (H) 09/26/2024 1024   CREATININE 0.8 10/12/2017 1302   CALCIUM 9.1 09/26/2024 1024   CALCIUM 9.3 10/12/2017 1302   PROT 6.3 (L) 09/26/2024 1024   PROT 6.9 10/12/2017 1302  ALBUMIN 4.0 09/26/2024 1024   ALBUMIN 4.3 10/12/2017 1302   AST 21 09/26/2024 1024   AST 17 10/12/2017 1302   ALT 16 09/26/2024 1024   ALT 13 10/12/2017 1302   ALKPHOS 67 09/26/2024 1024   ALKPHOS 66 10/12/2017 1302   BILITOT 0.5 09/26/2024 1024   BILITOT 0.59 10/12/2017 1302   GFRNONAA 58 (L)  09/26/2024 1024   GFRAA >60 01/24/2020 1037      Latest Ref Rng & Units 09/26/2024   10:24 AM 08/31/2024   10:23 AM 08/06/2024    3:45 PM  Hepatic Function  Total Protein 6.5 - 8.1 g/dL 6.3  6.3  7.9   Albumin 3.5 - 5.0 g/dL 4.0  4.3  4.6   AST 15 - 41 U/L 21  21  30    ALT 0 - 44 U/L 16  26  27    Alk Phosphatase 38 - 126 U/L 67  64  62   Total Bilirubin 0.0 - 1.2 mg/dL 0.5  0.7  1.5       Review of Systems   All systems reviewed and negative except where noted in HPI.    Physical Exam  BP 95/61   Pulse 70   Temp 98.4 F (36.9 C)   Ht 5' 3 (1.6 m)   Wt 116 lb 6.4 oz (52.8 kg)   SpO2 99%   BMI 20.62 kg/m  No LMP recorded. Patient is postmenopausal. General:   Alert and oriented. Pleasant and cooperative. Well-nourished and well-developed.  In no acute distress. Head:  Normocephalic and atraumatic. Eyes:  Without icterus Ears:  Normal auditory acuity. Lungs:  Respirations even and unlabored.  Clear throughout to auscultation.   No wheezes, crackles, or rhonchi. No acute distress. Heart:  Regular rate and rhythm; no murmurs, clicks, rubs, or gallops. Abdomen:  Normal bowel sounds.  No bruits.  Mild TTP epigastric area, soft, non-distended without masses, hepatosplenomegaly or hernias noted.  No guarding or rebound tenderness.  Rectal:  Deferred. Msk:  Symmetrical without gross deformities. Normal posture. Extremities:  Without edema. Neurologic:  Alert and  oriented x4;  grossly normal neurologically. Skin:  Intact without significant lesions or rashes. Psych:  Alert and cooperative. Normal mood and affect.   Assessment & Plan   Kathy Bishop is a 73 y.o. female presenting today with nausea and diarrhea.   Nausea.  Discussed that Verzenio  can have side effects of nausea but patient was having intense nausea prior to starting on this medication.  Last EGD 2022. - Continue on pantoprazole  40 mg once daily - Continue on bland diet and try to eat smaller frequent meals  throughout the day - Schedule EGD in the near future. I discussed risks of EGD with patient today, including risk of sedation, bleeding or perforation. Patient provides understanding and gave verbal consent to proceed.  Diarrhea - Intermittent diarrhea likely medication induced - Continue to take Imodium as needed  Patient will continue to follow-up with oncology for breast cancer with liver, bone and left axillary metastases.  Follow up in 2 months  Grayce Bohr, DNP, AGNP-C [facility]  "

## 2024-10-03 ENCOUNTER — Encounter: Payer: Self-pay | Admitting: Family Medicine

## 2024-10-03 ENCOUNTER — Inpatient Hospital Stay: Admitting: Pharmacist

## 2024-10-03 ENCOUNTER — Ambulatory Visit: Admitting: Family Medicine

## 2024-10-03 ENCOUNTER — Inpatient Hospital Stay

## 2024-10-03 VITALS — BP 95/61 | HR 70 | Temp 98.4°F | Ht 63.0 in | Wt 116.4 lb

## 2024-10-03 DIAGNOSIS — R197 Diarrhea, unspecified: Secondary | ICD-10-CM | POA: Diagnosis not present

## 2024-10-03 DIAGNOSIS — R110 Nausea: Secondary | ICD-10-CM

## 2024-10-08 ENCOUNTER — Ambulatory Visit: Payer: Self-pay | Admitting: Hematology and Oncology

## 2024-10-08 ENCOUNTER — Telehealth: Payer: Self-pay

## 2024-10-08 ENCOUNTER — Ambulatory Visit (HOSPITAL_COMMUNITY)
Admission: RE | Admit: 2024-10-08 | Discharge: 2024-10-08 | Disposition: A | Discharge status: Home / Self Care | Source: Ambulatory Visit | Attending: Hematology and Oncology | Admitting: Hematology and Oncology

## 2024-10-08 ENCOUNTER — Inpatient Hospital Stay

## 2024-10-08 ENCOUNTER — Other Ambulatory Visit: Payer: Self-pay

## 2024-10-08 DIAGNOSIS — Z170 Estrogen receptor positive status [ER+]: Secondary | ICD-10-CM

## 2024-10-08 DIAGNOSIS — C50412 Malignant neoplasm of upper-outer quadrant of left female breast: Secondary | ICD-10-CM | POA: Insufficient documentation

## 2024-10-08 LAB — URINALYSIS, COMPLETE (UACMP) WITH MICROSCOPIC
Bilirubin Urine: NEGATIVE
Glucose, UA: NEGATIVE mg/dL
Ketones, ur: NEGATIVE mg/dL
Nitrite: POSITIVE — AB
Protein, ur: 100 mg/dL — AB
RBC / HPF: >50 RBC/hpf (ref 0–5)
Specific Gravity, Urine: 1.014 (ref 1.005–1.030)
WBC, UA: >50 WBC/hpf (ref 0–5)
pH: 5 (ref 5.0–8.0)

## 2024-10-08 LAB — CMP (CANCER CENTER ONLY)
ALT: 9 U/L (ref 0–44)
AST: 15 U/L (ref 15–41)
Albumin: 3.8 g/dL (ref 3.5–5.0)
Alkaline Phosphatase: 66 U/L (ref 38–126)
Anion gap: 8 (ref 5–15)
BUN: 17 mg/dL (ref 8–23)
CO2: 24 mmol/L (ref 22–32)
Calcium: 8.9 mg/dL (ref 8.9–10.3)
Chloride: 107 mmol/L (ref 98–111)
Creatinine: 1.07 mg/dL — ABNORMAL HIGH (ref 0.44–1.00)
GFR, Estimated: 55 mL/min — ABNORMAL LOW
Glucose, Bld: 137 mg/dL — ABNORMAL HIGH (ref 70–99)
Potassium: 4 mmol/L (ref 3.5–5.1)
Sodium: 140 mmol/L (ref 135–145)
Total Bilirubin: 0.4 mg/dL (ref 0.0–1.2)
Total Protein: 5.9 g/dL — ABNORMAL LOW (ref 6.5–8.1)

## 2024-10-08 LAB — CBC WITH DIFFERENTIAL (CANCER CENTER ONLY)
Abs Immature Granulocytes: 0.01 K/uL (ref 0.00–0.07)
Basophils Absolute: 0 K/uL (ref 0.0–0.1)
Basophils Relative: 1 %
Eosinophils Absolute: 0.1 K/uL (ref 0.0–0.5)
Eosinophils Relative: 3 %
HCT: 30 % — ABNORMAL LOW (ref 36.0–46.0)
Hemoglobin: 10.5 g/dL — ABNORMAL LOW (ref 12.0–15.0)
Immature Granulocytes: 0 %
Lymphocytes Relative: 44 %
Lymphs Abs: 1.2 K/uL (ref 0.7–4.0)
MCH: 32.6 pg (ref 26.0–34.0)
MCHC: 35 g/dL (ref 30.0–36.0)
MCV: 93.2 fL (ref 80.0–100.0)
Monocytes Absolute: 0.1 K/uL (ref 0.1–1.0)
Monocytes Relative: 5 %
Neutro Abs: 1.3 K/uL — ABNORMAL LOW (ref 1.7–7.7)
Neutrophils Relative %: 47 %
Platelet Count: 105 K/uL — ABNORMAL LOW (ref 150–400)
RBC: 3.22 MIL/uL — ABNORMAL LOW (ref 3.87–5.11)
RDW: 12.7 % (ref 11.5–15.5)
Smear Review: NORMAL
WBC Count: 2.7 K/uL — ABNORMAL LOW (ref 4.0–10.5)
nRBC: 0 % (ref 0.0–0.2)

## 2024-10-08 NOTE — Telephone Encounter (Signed)
 Spoke with patient regarding new onset of symptoms. Patient reports UTI-like symptoms, including urinary frequency and burning with urination. Additionally, patient notes new onset of BLE. Patient denies any new prescriptions, prolonged time on feet, or recent injury. No shortness of breath or chest pain reported. Patient denies pain or redness in lower extremities. Observes that left ankle appears more swollen than right at this time.  Dr. Loretha notified and recommends labs to evaluate UTI symptoms and bilateral lower extremity doppler ultrasound to rule out possible DVT. Patient agrees with plan. Lab appointment scheduled for today at 1:15 PM and confirmed. Patient informed to expect a call from the ultrasound office to schedule doppler study. Patient verbalized understanding of instructions. All questions addressed during the call.

## 2024-10-08 NOTE — Telephone Encounter (Signed)
-----   Message from Amber Stalls, MD sent at 10/08/2024  2:59 PM EST ----- Leita Greek like pt was symptomatic and her UA is consistent with UTI. If she is not allergic to ciprofloxacin, then I would recommend sending a prescription for cipro 500 mg daily for 7 days.

## 2024-10-08 NOTE — Telephone Encounter (Signed)
 LVM for patient regarding new s/s from verzenio . Patient reported new UTI-like s/s and L ankle swelling. Unable to reach patient at this time. Provided call back number and requested for patient to return call to clinic.

## 2024-10-08 NOTE — Telephone Encounter (Signed)
 S/w pt and was given results. She was educated on cipro and appropriate diet for this medication. Also advised if her culture is sensitive to different drug we will cx cipro and order appropriate abx. Pt advised if sx have not improved after finished abx course she should call us . She is agreeable. Cipro 500 mg 1 tab BID x 7 days sent to [facility]  [address] per MD.

## 2024-10-09 ENCOUNTER — Inpatient Hospital Stay: Admitting: Physician Assistant

## 2024-10-09 VITALS — BP 115/70 | HR 58 | Temp 97.2°F | Resp 18 | Wt 118.9 lb

## 2024-10-09 DIAGNOSIS — R300 Dysuria: Secondary | ICD-10-CM

## 2024-10-09 DIAGNOSIS — M25471 Effusion, right ankle: Secondary | ICD-10-CM

## 2024-10-09 DIAGNOSIS — D759 Disease of blood and blood-forming organs, unspecified: Secondary | ICD-10-CM

## 2024-10-09 DIAGNOSIS — C50412 Malignant neoplasm of upper-outer quadrant of left female breast: Secondary | ICD-10-CM | POA: Diagnosis not present

## 2024-10-09 DIAGNOSIS — M25472 Effusion, left ankle: Secondary | ICD-10-CM

## 2024-10-09 DIAGNOSIS — Z170 Estrogen receptor positive status [ER+]: Secondary | ICD-10-CM | POA: Diagnosis not present

## 2024-10-09 DIAGNOSIS — M25473 Effusion, unspecified ankle: Secondary | ICD-10-CM

## 2024-10-09 DIAGNOSIS — T50905A Adverse effect of unspecified drugs, medicaments and biological substances, initial encounter: Secondary | ICD-10-CM

## 2024-10-09 LAB — CANCER ANTIGEN 27.29: CA 27.29: 62.7 U/mL — ABNORMAL HIGH (ref 0.0–38.6)

## 2024-10-09 LAB — CANCER ANTIGEN 15-3: CA 15-3: 58.5 U/mL — ABNORMAL HIGH (ref 0.0–25.0)

## 2024-10-09 MED ORDER — CIPROFLOXACIN HCL 500 MG PO TABS: 500.0000 mg | ORAL_TABLET | Freq: Two times a day (BID) | ORAL | 0 refills | Status: DC

## 2024-10-09 NOTE — Progress Notes (Signed)
 " [facility] [phone]   [phone]  SYMPTOM MANAGEMENT CLINIC NOTE  Patient Care Team: Corwin Antu, FNP as PCP - General (Family Medicine) Argentina Clap, MD as PCP - Cardiology (Cardiology) Vernetta Berg, MD as Consulting Physician (General Surgery) Izell Domino, MD as Attending Physician (Radiation Oncology) Crawford Morna Pickle, NP as Nurse Practitioner (Hematology and Oncology)   CHIEF COMPLAINTS/PURPOSE OF CONSULTATION:  Dysuria Lower extremity edema  HISTORY OF PRESENTING ILLNESS:  Kathy Bishop 73 y.o. female presents for a symptom management clinic visit. She is under the care of Dr.  Gudena for left breast cancer. She is unaccompanied for this visit.   Kathy Bishop reports having burning with urination over the weekend that has not improved. She denies any foul smelling odor or dark colored urine. She noticed bilateral (left greater than right) ankle edema which has mildly improved today. She denies any ankle pain, redness or warmth to touch. She continues to have ongoing nausea and takes zofran  with some improvement. She plans to undergo EGD on 10/26/2024 to further evaluate. She has intermittent episodes of diarrhea and takes imodium with improvement. She denies easy bruising or overt signs of bleeding. She denies fevers, chills, sweats, shortness of breath, chest pain or cough. Rest of the ROS is below.   MEDICAL HISTORY:  Past Medical History:  Diagnosis Date   Anxiety    Breast cancer (HCC)    Depression    Dermatitis    allergic   Foot pain, right    History of radiation therapy 12/20/16- 01/17/17   Left Breast 40.05 Gy in 15 fractions and a 10 Gy boost in 5 fractions.    Malignant neoplasm of upper-outer quadrant of left breast in female, estrogen receptor positive (HCC) 08/31/2016   Need for prophylactic hormone replacement therapy (postmenopausal)    Neuromuscular disorder (HCC)    Nocturia    Personal history of  radiation therapy    Routine general medical examination at a health care facility    Symptomatic menopausal or female climacteric states     SURGICAL HISTORY: Past Surgical History:  Procedure Laterality Date   BREAST LUMPECTOMY Left    BREAST LUMPECTOMY WITH SENTINEL LYMPH NODE BIOPSY Left 11/09/2016   Procedure: LEFT BREAST LUMPECTOMY WITH SENTINEL LYMPH NODE BX;  Surgeon: Berg Vernetta, MD;  Location:  SURGERY CENTER;  Service: General;  Laterality: Left;   EYE SURGERY  11/12/22   Removal of cataracts on both eyes   TONSILLECTOMY     age 42    SOCIAL HISTORY: Social History   Socioeconomic History   Marital status: Married    Spouse name: Not on file   Number of children: 0   Years of education: Not on file   Highest education level: Bachelor's degree (e.g., BA, AB, BS)  Occupational History   Occupation: retired  Tobacco Use   Smoking status: Former   Smokeless tobacco: Never   Tobacco comments:    Social smoker in mid 20's.  Never addicted  Vaping Use   Vaping status: Never Used  Substance and Sexual Activity   Alcohol use: Yes    Alcohol/week: 2.0 standard drinks of alcohol    Types: 2 Glasses of wine per week   Drug use: No   Sexual activity: Not Currently    Birth control/protection: Abstinence  Other Topics Concern   Not on file  Social History Narrative   Not on file   Social Drivers of Health   Tobacco Use: Medium  Risk (10/03/2024)   Patient History    Smoking Tobacco Use: Former    Smokeless Tobacco Use: Never    Passive Exposure: Not on file  Financial Resource Strain: Low Risk (08/31/2023)   Overall Financial Resource Strain (CARDIA)    Difficulty of Paying Living Expenses: Not very hard  Food Insecurity: No Food Insecurity (08/31/2023)   Hunger Vital Sign    Worried About Running Out of Food in the Last Year: Never true    Ran Out of Food in the Last Year: Never true  Transportation Needs: No Transportation Needs (08/31/2023)    PRAPARE - Administrator, Civil Service (Medical): No    Lack of Transportation (Non-Medical): No  Physical Activity: Sufficiently Active (08/31/2023)   Exercise Vital Sign    Days of Exercise per Week: 6 days    Minutes of Exercise per Session: 120 min  Stress: Stress Concern Present (08/31/2023)   [employer] of Occupational Health - Occupational Stress Questionnaire    Feeling of Stress : Rather much  Social Connections: Socially Isolated (08/31/2023)   Social Connection and Isolation Panel    Frequency of Communication with Friends and Family: Once a week    Frequency of Social Gatherings with Friends and Family: Once a week    Attends Religious Services: Never    Database Administrator or Organizations: No    Attends Engineer, Structural: Not on file    Marital Status: Married  Catering Manager Violence: Not on file  Depression (PHQ2-9): Medium Risk (03/29/2022)   Depression (PHQ2-9)    PHQ-2 Score: 8  Alcohol Screen: Low Risk (08/31/2023)   Alcohol Screen    Last Alcohol Screening Score (AUDIT): 4  Housing: Low Risk (08/31/2023)   Housing    Last Housing Risk Score: 0  Utilities: Not on file  Health Literacy: Not on file    FAMILY HISTORY: Family History  Problem Relation Age of Onset   Alzheimer's disease Mother    Lung cancer Father        smoker   Alcohol abuse Father    Colon cancer Neg Hx    Esophageal cancer Neg Hx    Rectal cancer Neg Hx    Stomach cancer Neg Hx     ALLERGIES:  is allergic to buspirone .  MEDICATIONS:  Current Outpatient Medications  Medication Sig Dispense Refill   abemaciclib  (VERZENIO ) 100 MG tablet Take 1 tablet (100 mg total) by mouth 2 (two) times daily. 168 tablet 0   amLODipine  (NORVASC ) 5 MG tablet Take 1 tablet (5 mg total) by mouth daily. 90 tablet 1   ciprofloxacin (CIPRO) 500 MG tablet Take 1 tablet (500 mg total) by mouth 2 (two) times daily. 14 tablet 0   escitalopram  (LEXAPRO ) 10 MG tablet Take  1 tablet (10 mg total) by mouth daily. 90 tablet 0   escitalopram  (LEXAPRO ) 20 MG tablet Take 1 tablet (20 mg total) by mouth daily. 90 tablet 1   letrozole  (FEMARA ) 2.5 MG tablet Take 1 tablet (2.5 mg total) by mouth daily. 90 tablet 3   pantoprazole  (PROTONIX ) 40 MG tablet Take 1 tablet (40 mg total) by mouth daily. 30 tablet 3   promethazine  (PHENERGAN ) 25 MG suppository Place 1 suppository (25 mg total) rectally every 8 (eight) hours as needed for nausea or vomiting. 60 each 3   sucralfate  (CARAFATE ) 1 g tablet      UNKNOWN TO PATIENT Fiber Suppliment     zolpidem  (AMBIEN ) 5 MG  tablet Take 1 tablet (5 mg total) by mouth at bedtime as needed for sleep. 30 tablet 3   No current facility-administered medications for this visit.    REVIEW OF SYSTEMS:   Constitutional: ( - ) fevers, ( - )  chills , ( - ) night sweats Eyes: ( - ) blurriness of vision, ( - ) double vision, ( - ) watery eyes Ears, nose, mouth, throat, and face: ( - ) mucositis, ( - ) sore throat Respiratory: ( - ) cough, ( - ) dyspnea, ( - ) wheezes Cardiovascular: ( - ) palpitation, ( - ) chest discomfort, (+ ) lower extremity swelling Gastrointestinal:  (+) nausea, ( - ) heartburn, ( - ) change in bowel habits Skin: ( - ) abnormal skin rashes Lymphatics: ( - ) new lymphadenopathy, ( - ) easy bruising Neurological: ( - ) numbness, ( - ) tingling, ( - ) new weaknesses Behavioral/Psych: ( - ) mood change, ( - ) new changes  All other systems were reviewed with the patient and are negative.  PHYSICAL EXAMINATION: ECOG PERFORMANCE STATUS: 1 - Symptomatic but completely ambulatory  Vitals:   10/09/24 0832  BP: 115/70  Pulse: (!) 58  Resp: 18  Temp: (!) 97.2 F (36.2 C)  SpO2: 100%   Filed Weights   10/09/24 0832  Weight: 118 lb 14.4 oz (53.9 kg)    GENERAL: well appearing female in NAD  SKIN: skin color, texture, turgor are normal, no rashes or significant lesions EYES: conjunctiva are pink and non-injected,  sclera clear LUNGS: clear to auscultation and percussion with normal breathing effort HEART: regular rate & rhythm and no murmurs. Trace ankle edema BACK: No CVA tenderness Musculoskeletal: no cyanosis of digits and no clubbing  PSYCH: alert & oriented x 3, fluent speech NEURO: no focal motor/sensory deficits  LABORATORY DATA:  I have reviewed the data as listed    Latest Ref Rng & Units 10/08/2024    1:24 PM 09/26/2024   10:24 AM 08/16/2024    9:09 AM  CBC  WBC 4.0 - 10.5 K/uL 2.7  2.4  3.9   Hemoglobin 12.0 - 15.0 g/dL 89.4  87.9  85.9   Hematocrit 36.0 - 46.0 % 30.0  35.2  41.2   Platelets 150 - 400 K/uL 105  99  195        Latest Ref Rng & Units 10/08/2024    1:24 PM 09/26/2024   10:24 AM 08/31/2024   10:23 AM  CMP  Glucose 70 - 99 mg/dL 862  92  84   BUN 8 - 23 mg/dL 17  15  19    Creatinine 0.44 - 1.00 mg/dL 8.92  8.97  9.24   Sodium 135 - 145 mmol/L 140  141  139   Potassium 3.5 - 5.1 mmol/L 4.0  3.9  4.3   Chloride 98 - 111 mmol/L 107  107  103   CO2 22 - 32 mmol/L 24  26  30    Calcium 8.9 - 10.3 mg/dL 8.9  9.1  9.2   Total Protein 6.5 - 8.1 g/dL 5.9  6.3  6.3   Total Bilirubin 0.0 - 1.2 mg/dL 0.4  0.5  0.7   Alkaline Phos 38 - 126 U/L 66  67  64   AST 15 - 41 U/L 15  21  21    ALT 0 - 44 U/L 9  16  26       RADIOGRAPHIC STUDIES: I have personally reviewed the radiological images  as listed and agreed with the findings in the report. VAS US  LOWER EXTREMITY VENOUS (DVT) Result Date: 10/08/2024  Lower Venous DVT Study Patient Name:  Kathy Bishop  Date of Exam:   10/08/2024 Medical Rec #: [id number]     Accession #:    [id number] Date of Birth: Jul 14, 1951      Patient Gender: F Patient Age:   12 years Exam Location:  [address] Procedure:      VAS US  LOWER EXTREMITY VENOUS (DVT) Referring Phys: AMBER IRUKU --------------------------------------------------------------------------------  Indications: Swelling.  Comparison Study: N/A Performing Technologist: Dena Pane  Examination Guidelines: A complete evaluation includes B-mode imaging, spectral Doppler, color Doppler, and power Doppler as needed of all accessible portions of each vessel. Bilateral testing is considered an integral part of a complete examination. Limited examinations for reoccurring indications may be performed as noted. The reflux portion of the exam is performed with the patient in reverse Trendelenburg.  +---------+---------------+---------+-----------+----------+--------------+ RIGHT    CompressibilityPhasicitySpontaneityPropertiesThrombus Aging +---------+---------------+---------+-----------+----------+--------------+ CFV      Full           Yes      Yes                                 +---------+---------------+---------+-----------+----------+--------------+ SFJ      Full                                                        +---------+---------------+---------+-----------+----------+--------------+ FV Prox  Full           Yes      Yes                                 +---------+---------------+---------+-----------+----------+--------------+ FV Mid   Full                                                        +---------+---------------+---------+-----------+----------+--------------+ FV DistalFull                                                        +---------+---------------+---------+-----------+----------+--------------+ PFV      Full                                                        +---------+---------------+---------+-----------+----------+--------------+ POP      Full           Yes      Yes                                 +---------+---------------+---------+-----------+----------+--------------+ PTV      Full                                                        +---------+---------------+---------+-----------+----------+--------------+  PERO     Full                                                         +---------+---------------+---------+-----------+----------+--------------+ GSV      Full                                                        +---------+---------------+---------+-----------+----------+--------------+  +---------+---------------+---------+-----------+----------+--------------+ LEFT     CompressibilityPhasicitySpontaneityPropertiesThrombus Aging +---------+---------------+---------+-----------+----------+--------------+ CFV      Full           Yes      Yes                                 +---------+---------------+---------+-----------+----------+--------------+ SFJ      Full                                                        +---------+---------------+---------+-----------+----------+--------------+ FV Prox  Full           Yes      Yes                                 +---------+---------------+---------+-----------+----------+--------------+ FV Mid   Full                                                        +---------+---------------+---------+-----------+----------+--------------+ FV DistalFull                                                        +---------+---------------+---------+-----------+----------+--------------+ PFV      Full                                                        +---------+---------------+---------+-----------+----------+--------------+ POP      Full           Yes      Yes                                 +---------+---------------+---------+-----------+----------+--------------+ PTV      Full                                                        +---------+---------------+---------+-----------+----------+--------------+  PERO     Full                                                        +---------+---------------+---------+-----------+----------+--------------+ GSV      Full                                                         +---------+---------------+---------+-----------+----------+--------------+  Summary: BILATERAL: - No evidence of deep vein thrombosis seen in the lower extremities, bilaterally. - No evidence of superficial venous thrombosis in the lower extremities, bilaterally. -No evidence of popliteal cyst, bilaterally.   *See table(s) above for measurements and observations.    Preliminary    LONG TERM MONITOR (3-14 DAYS) Result Date: 09/30/2024 Patch Wear Time:  10 days and 3 hours (2025-12-02T03:29:59-0500 to 2025-12-12T06:55:56-0500) HR 41 - 133, average 62 bpm. 3 nonsustained SVT (longest 6 beats). No atrial fibrillation detected. Rare supraventricular ectopy. Rare ventricular ectopy. No sustained arrhythmias. No symptom trigger episodes. [facility]   ASSESSMENT & PLAN Kathy Bishop is a 73 y.o. female who presents to the symptom management clinic for ankle edema and dysuria.  #Dysuria: --Symptoms include burning with urination --UA from yesterday is consistent with UTI. Culture is pending --Will send 7 day course of cipro 500 mg BID. Will adjust antibiotic therapy as needed once culture finalizes.  #Bilateral ankle edema: --Doppler US  from yesterday showed no evidence of DVT --Encouraged to elevate her legs and wear compression stocking if edema worsens  #Cytopenias: --WBC 2.7, Hgb 10.5, Plt 105K --Secondary to Verzenio  therapy --Monitor closely and provided precautions for neutropenia, thrombocytopenia and anemia.   Follow up: --RTC on 10/25/2024 for a follow up visit with Dr. Odean  No orders of the defined types were placed in this encounter.   All questions were answered. The patient knows to call the clinic with any problems, questions or concerns.  I have spent a total of 25 minutes minutes of face-to-face and non-face-to-face time, preparing to see the patient, performing a medically appropriate examination, counseling and educating the patient, ordering  medications, documenting clinical information in the electronic health record, independently interpreting results and communicating results to the patient, and care coordination.   Johnston Police, PA-C Department of Hematology/Oncology [facility] at [facility] Phone: [phone] "

## 2024-10-10 LAB — URINE CULTURE: Culture: 30000 — AB

## 2024-10-17 ENCOUNTER — Telehealth: Payer: Self-pay | Admitting: *Deleted

## 2024-10-17 NOTE — Telephone Encounter (Signed)
 Received call from pt with complain of ongoing nausea while on Verzenio .  Pt states the nausea is consistent, even with phenergan  suppositories.  Pt requesting telephone visit with MD to further discuss Verzenio  dose and ongoing side effects.  Appt scheduled, pt educated and verbalized understanding.

## 2024-10-18 ENCOUNTER — Inpatient Hospital Stay: Attending: Hematology and Oncology | Admitting: Hematology and Oncology

## 2024-10-18 DIAGNOSIS — C787 Secondary malignant neoplasm of liver and intrahepatic bile duct: Secondary | ICD-10-CM | POA: Diagnosis not present

## 2024-10-18 DIAGNOSIS — Z170 Estrogen receptor positive status [ER+]: Secondary | ICD-10-CM

## 2024-10-18 DIAGNOSIS — Z1721 Progesterone receptor positive status: Secondary | ICD-10-CM

## 2024-10-18 DIAGNOSIS — C50412 Malignant neoplasm of upper-outer quadrant of left female breast: Secondary | ICD-10-CM

## 2024-10-18 DIAGNOSIS — Z1732 Human epidermal growth factor receptor 2 negative status: Secondary | ICD-10-CM | POA: Diagnosis not present

## 2024-10-18 DIAGNOSIS — Z79811 Long term (current) use of aromatase inhibitors: Secondary | ICD-10-CM | POA: Diagnosis not present

## 2024-10-18 DIAGNOSIS — C7951 Secondary malignant neoplasm of bone: Secondary | ICD-10-CM | POA: Diagnosis not present

## 2024-10-18 MED ORDER — ABEMACICLIB 50 MG PO TABS: 50.0000 mg | ORAL_TABLET | Freq: Two times a day (BID) | ORAL | 3 refills | Status: AC

## 2024-10-18 NOTE — Progress Notes (Signed)
 HEMATOLOGY-ONCOLOGY TELEPHONE VISIT PROGRESS NOTE  I connected with our patient on 10/18/2024 at  8:00 AM EST by telephone and verified that I am speaking with the correct person using two identifiers.  I discussed the limitations, risks, security and privacy concerns of performing an evaluation and management service by telephone and the availability of in person appointments.  I also discussed with the patient that there may be a patient responsible charge related to this service. The patient expressed understanding and agreed to proceed.   History of Present Illness: Telephone follow-up regarding persistent nausea and vomiting from Verzinio  History of Present Illness  Kathy Bishop is a 74 year old female with metastatic ER-positive, HER2-negative breast cancer who presents with refractory nausea.  She is in the first cycle of letrozole  and abemaciclib  for metastatic breast cancer with liver and bone metastases, started on August 13, 2024.  She has persistent, constant, all-day nausea throughout the holidays that is refractory to pantoprazole  and promethazine  and significantly impairs her quality of life.  She took her morning dose of abemaciclib  today.      Oncology History Overview Note  3 1/   Malignant neoplasm of upper-outer quadrant of left breast in female, estrogen receptor positive (HCC)  08/20/2016 Initial Biopsy   Left breast, needle core biopsy, 2 o'clock, 8cmfn: IDC, DCIS with necrosis, grade 2, ER+(95%), PR+(100%), Ki-67 15%, HER-2 negative (ratio 0.78). Left breast, needle core biopsy, 2 o'clock, 5cmfn: IDC, grade 2, ER+(95%), PR+(100%), Ki-67 10%, HER-2 negative (ratio 1.06).     09/10/2016 - 09/2021 Anti-estrogen oral therapy   Anastrozole  daily bone density 12/08/2016 showed a T score of -0.2 (normal).   11/09/2016 Surgery   Left breast lumpectomy Jan): IDC with calcifications, Grade II/III, two foci, 3cm and 0.6 cm, DCIS with calcifications, +lymphovascular  invasion, 1 SLN negative, T2, N0   11/09/2016 Oncotype testing   25/17% intermediate risk   12/20/2016 - 01/17/2017 Radiation Therapy   Left Breast treated to 40.05 Gy in 15 fx of 2.67 Gy/fx  and a 10 Gy boost in 5 fx of 2 Gy/fx      REVIEW OF SYSTEMS:   Constitutional: Denies fevers, chills or abnormal weight loss All other systems were reviewed with the patient and are negative. Observations/Objective:     Assessment Plan:  Malignant neoplasm of upper-outer quadrant of left breast in female, estrogen receptor positive (HCC) 10/21/2015:Left breast, needle core biopsy, 2 o'clock, 8cmfn: IDC, DCIS with necrosis, grade 2, ER+(95%), PR+(100%), Ki-67 15%, HER-2 negative (ratio 0.78). Left breast, needle core biopsy, 2 o'clock, 5cmfn: IDC, grade 2, ER+(95%), PR+(100%), Ki-67 10%, HER-2 negative (ratio 1.06).     11/09/2016:Left breast lumpectomy Jan): IDC with calcifications, Grade II/III, two foci, 3cm and 0.6 cm, DCIS with calcifications, +lymphovascular invasion, 1 SLN negative, T2, N0 Oncotype DX score 25: 17% risk Adjuvant radiation completed 01/17/2017 Adjuvant anastrozole  completed December 2022 ---------------------------------------------------------------------------------------------------------------------- CT abdomen pelvis 08/04/2024: Multiple liver metastases (2.4 cm, 1.1 cm, 2.3 cm), multiple bone metastases: L3 1 cm, endometrial thickening   Recommendation: Ultrasound-guided liver biopsy 08/16/2024: Metastatic carcinoma compatible with breast, ER 99%, PR 2%, Ki-67 5%, HER2 2+ by IHC, FISH negative ratio 1.39 PET CT scan 08/20/2024: Hypermetabolic liver lesions, left axilla soft tissue density hypermetabolic activity concerning for active malignancy, focus of activity left upper sternal body Recommendation: Letrozole  with Verzinio.  Start 08/13/2024 Monitor tumor markers CA 15-3 and CEA 2729   Abemaciclib  toxicities: Nausea and vomiting: In spite of Phenergan  suppositories  and also Protonix .  Recommended reducing the  dosage of Verzinio to 50 mg p.o. twice daily.  I sent a new prescription to [facility]. Decreased blood counts: Monitoring closely Denies any significant diarrhea   Return to clinic in 1 month for labs and follow-up   I discussed the assessment and treatment plan with the patient. The patient was provided an opportunity to ask questions and all were answered. The patient agreed with the plan and demonstrated an understanding of the instructions. The patient was advised to call back or seek an in-person evaluation if the symptoms worsen or if the condition fails to improve as anticipated.   I provided 20 minutes of non-face-to-face time during this encounter.  This includes time for charting and coordination of care   Naomi MARLA Chad, MD

## 2024-10-18 NOTE — Assessment & Plan Note (Signed)
 10/21/2015:Left breast, needle core biopsy, 2 o'clock, 8cmfn: IDC, DCIS with necrosis, grade 2, ER+(95%), PR+(100%), Ki-67 15%, HER-2 negative (ratio 0.78). Left breast, needle core biopsy, 2 o'clock, 5cmfn: IDC, grade 2, ER+(95%), PR+(100%), Ki-67 10%, HER-2 negative (ratio 1.06).     11/09/2016:Left breast lumpectomy Jan): IDC with calcifications, Grade II/III, two foci, 3cm and 0.6 cm, DCIS with calcifications, +lymphovascular invasion, 1 SLN negative, T2, N0 Oncotype DX score 25: 17% risk Adjuvant radiation completed 01/17/2017 Adjuvant anastrozole  completed December 2022 ---------------------------------------------------------------------------------------------------------------------- CT abdomen pelvis 08/04/2024: Multiple liver metastases (2.4 cm, 1.1 cm, 2.3 cm), multiple bone metastases: L3 1 cm, endometrial thickening   Recommendation: Ultrasound-guided liver biopsy 08/16/2024: Metastatic carcinoma compatible with breast, ER 99%, PR 2%, Ki-67 5%, HER2 2+ by IHC, FISH negative ratio 1.39 PET CT scan 08/20/2024: Hypermetabolic liver lesions, left axilla soft tissue density hypermetabolic activity concerning for active malignancy, focus of activity left upper sternal body Recommendation: Letrozole  with Verzinio.  Start 08/13/2024 Monitor tumor markers CA 15-3 and CEA 2729   Abemaciclib  toxicities: Nausea and vomiting: In spite of Phenergan  suppositories and also Protonix .  Recommended reducing the dosage of Verzinio Decreased blood counts: Monitoring closely Denies any significant diarrhea   Return to clinic in 1 month for labs and follow-up

## 2024-10-19 ENCOUNTER — Encounter: Payer: Self-pay | Admitting: Gastroenterology

## 2024-10-19 ENCOUNTER — Other Ambulatory Visit: Payer: Self-pay

## 2024-10-19 ENCOUNTER — Telehealth: Payer: Self-pay | Admitting: Hematology and Oncology

## 2024-10-19 NOTE — Telephone Encounter (Signed)
 left vm fo pt about scheduled f/u appts date and time per 1/8 los. Encouraged pt to call back if need to reschedule.

## 2024-10-22 ENCOUNTER — Encounter: Payer: Self-pay | Admitting: Gastroenterology

## 2024-10-25 ENCOUNTER — Inpatient Hospital Stay

## 2024-10-25 ENCOUNTER — Inpatient Hospital Stay: Admitting: Hematology and Oncology

## 2024-10-26 ENCOUNTER — Other Ambulatory Visit: Payer: Self-pay

## 2024-10-26 ENCOUNTER — Encounter: Payer: Self-pay | Admitting: Gastroenterology

## 2024-10-26 ENCOUNTER — Ambulatory Visit: Payer: Self-pay | Admitting: Anesthesiology

## 2024-10-26 ENCOUNTER — Ambulatory Visit
Admission: RE | Admit: 2024-10-26 | Discharge: 2024-10-26 | Disposition: A | Discharge status: Home / Self Care | Attending: Gastroenterology | Admitting: Gastroenterology

## 2024-10-26 ENCOUNTER — Encounter: Payer: Self-pay | Admitting: Anesthesiology

## 2024-10-26 ENCOUNTER — Encounter: Admission: RE | Disposition: A | Payer: Self-pay | Source: Home / Self Care | Attending: Gastroenterology

## 2024-10-26 DIAGNOSIS — R110 Nausea: Secondary | ICD-10-CM

## 2024-10-26 DIAGNOSIS — Z87891 Personal history of nicotine dependence: Secondary | ICD-10-CM | POA: Diagnosis not present

## 2024-10-26 DIAGNOSIS — K2950 Unspecified chronic gastritis without bleeding: Secondary | ICD-10-CM | POA: Insufficient documentation

## 2024-10-26 HISTORY — PX: ESOPHAGOGASTRODUODENOSCOPY: SHX5428

## 2024-10-26 MED ORDER — STERILE WATER FOR IRRIGATION IR SOLN
Status: DC | PRN
  Administered 2024-10-26: 250 mL

## 2024-10-26 MED ORDER — PROPOFOL 10 MG/ML IV BOLUS
INTRAVENOUS | Status: DC | PRN
  Administered 2024-10-26: 120 ug/kg/min via INTRAVENOUS
  Administered 2024-10-26: 120 mg via INTRAVENOUS

## 2024-10-26 MED ORDER — SODIUM CHLORIDE 0.9 % IV SOLN: INTRAVENOUS | Status: DC

## 2024-10-26 MED ORDER — LACTATED RINGERS IV SOLN: INTRAVENOUS | Status: DC

## 2024-10-26 NOTE — Anesthesia Preprocedure Evaluation (Signed)
"                                    Anesthesia Evaluation  Patient identified by MRN, date of birth, ID band Patient awake    Reviewed: Allergy & Precautions, H&P , NPO status , Patient's Chart, lab work & pertinent test results  Airway Mallampati: II  TM Distance: >3 FB Neck ROM: Full    Dental no notable dental hx. (+) Teeth Intact   Pulmonary neg pulmonary ROS, former smoker   Pulmonary exam normal breath sounds clear to auscultation       Cardiovascular negative cardio ROS Normal cardiovascular exam Rhythm:Regular Rate:Normal     Neuro/Psych negative neurological ROS  negative psych ROS   GI/Hepatic negative GI ROS, Neg liver ROS,,,  Endo/Other  negative endocrine ROS    Renal/GU negative Renal ROS  negative genitourinary   Musculoskeletal negative musculoskeletal ROS (+)    Abdominal   Peds negative pediatric ROS (+)  Hematology negative hematology ROS (+)   Anesthesia Other Findings   Reproductive/Obstetrics negative OB ROS                              Anesthesia Physical Anesthesia Plan  ASA: 3  Anesthesia Plan: General   Post-op Pain Management:    Induction: Intravenous  PONV Risk Score and Plan:   Airway Management Planned:   Additional Equipment:   Intra-op Plan:   Post-operative Plan: Extubation in OR  Informed Consent: I have reviewed the patients History and Physical, chart, labs and discussed the procedure including the risks, benefits and alternatives for the proposed anesthesia with the patient or authorized representative who has indicated his/her understanding and acceptance.     Dental advisory given  Plan Discussed with: CRNA  Anesthesia Plan Comments:         Anesthesia Quick Evaluation  "

## 2024-10-26 NOTE — Op Note (Addendum)
 [facility] Patient Name: Kathy Bishop Procedure Date: 10/26/2024 8:00 AM MRN: [id number] Account #: [id number] Date of Birth: 31-Aug-1951 Admit Type: Outpatient Age: 74 Room: [facility] OR ROOM 01 Gender: Female Note Status: Finalized Instrument Name: Endoscope [id number] Procedure:             Upper GI endoscopy Indications:           Upper abdominal symptoms that persist despite an                         appropriate trial of therapy, New-onset upper                         abdominal symptoms in patient older than 50 years Providers:             Clotilda Schaffer, MD Referring MD:          Ginger Patrick (Referring MD) Medicines:             Propofol  per Anesthesia Complications:         No immediate complications. Procedure:             Pre-Anesthesia Assessment:                        - Prior to the procedure, a History and Physical was                         performed, and patient medications and allergies were                         reviewed. The patient's tolerance of previous                         anesthesia was also reviewed. The risks and benefits                         of the procedure and the sedation options and risks                         were discussed with the patient. All questions were                         answered, and informed consent was obtained. Prior                         Anticoagulants: The patient has taken no anticoagulant                         or antiplatelet agents. ASA Grade Assessment: III - A                         patient with severe systemic disease. After reviewing                         the risks and benefits, the patient was deemed in                         satisfactory condition to undergo the procedure.  After obtaining informed consent, the endoscope was                         passed under direct vision. Throughout the procedure,                         the patient's blood pressure,  pulse, and oxygen                         saturations were monitored continuously. The Endoscope                         was introduced through the mouth, and advanced to the                         third part of duodenum. The upper GI endoscopy was                         accomplished without difficulty. The patient tolerated                         the procedure well. Findings:      The examined esophagus was normal. Biopsies were taken with a cold       forceps for histology. Estimated blood loss: none.      The entire examined stomach was normal. Biopsies were taken with a cold       forceps for histology. Estimated blood loss: none.      The examined duodenum was normal. Biopsies for histology were taken with       a cold forceps for evaluation of celiac disease. Estimated blood loss:       none. Impression:            - Normal esophagus. Biopsied.                        - Normal stomach. Biopsied.                        - Normal examined duodenum. Biopsied. Recommendation:        - Patient has a contact number available for                         emergencies. The signs and symptoms of potential                         delayed complications were discussed with the patient.                         Return to normal activities tomorrow. Written                         discharge instructions were provided to the patient.                        - High fiber diet.                        - Continue present medications.                        -  Await pathology results.                        - The findings and recommendations were discussed with                         the designated responsible adult. Procedure Code(s):     --- Professional ---                        [phone], Esophagogastroduodenoscopy, flexible,                         transoral; with biopsy, single or multiple Diagnosis Code(s):     --- Professional ---                        R19.8, Other specified symptoms and signs  involving                         the digestive system and abdomen CPT copyright 2022 American Medical Association. All rights reserved. The codes documented in this report are preliminary and upon coder review may  be revised to meet current compliance requirements. Clotilda Schaffer, MD 10/26/2024 8:22:23 AM Number of Addenda: 0 Note Initiated On: 10/26/2024 8:00 AM Total Procedure Duration: 0 hours 8 minutes 31 seconds  Estimated Blood Loss:  Estimated blood loss: none.      [facility]

## 2024-10-26 NOTE — Transfer of Care (Signed)
 Immediate Anesthesia Transfer of Care Note  Patient: Kathy Bishop  Procedure(s) Performed: EGD (ESOPHAGOGASTRODUODENOSCOPY)  Patient Location: PACU  Anesthesia Type: General  Level of Consciousness: awake, alert  and patient cooperative  Airway and Oxygen Therapy: Patient Spontanous Breathing   Post-op Assessment: Post-op Vital signs reviewed, Patient's Cardiovascular Status Stable, Respiratory Function Stable, Patent Airway and No signs of Nausea or vomiting  Post-op Vital Signs: Reviewed and stable  Complications: No notable events documented.

## 2024-10-26 NOTE — H&P (Signed)
 "  Kathy Schaffer, MD [facility] [address] [place], [place] [address] [phone] Fax : [phone]  Primary Care Physician:  Corwin Antu, FNP Primary Gastroenterologist:  Dr. Schaffer  Pre-Procedure History & Physical: HPI:  Kathy Bishop is a 74 y.o. female is here for an endoscopy as evaluation of nausea, see progress note dated 10/03/24 from Grayce Bohr, NP.  Prior procedure? 2022, normal. Blood thinners? No   Past Medical History:  Diagnosis Date   Anxiety    Breast cancer (HCC)    Depression    Dermatitis    allergic   Foot pain, right    History of radiation therapy 12/20/16- 01/17/17   Left Breast 40.05 Gy in 15 fractions and a 10 Gy boost in 5 fractions.    Malignant neoplasm of upper-outer quadrant of left breast in female, estrogen receptor positive (HCC) 08/31/2016   Need for prophylactic hormone replacement therapy (postmenopausal)    Neuromuscular disorder (HCC)    Nocturia    Personal history of radiation therapy    Routine general medical examination at a health care facility    Symptomatic menopausal or female climacteric states     Past Surgical History:  Procedure Laterality Date   BREAST LUMPECTOMY Left    BREAST LUMPECTOMY WITH SENTINEL LYMPH NODE BIOPSY Left 11/09/2016   Procedure: LEFT BREAST LUMPECTOMY WITH SENTINEL LYMPH NODE BX;  Surgeon: Vicenta Poli, MD;  Location: [facility];  Service: General;  Laterality: Left;   EYE SURGERY  11/12/22   Removal of cataracts on both eyes   TONSILLECTOMY     age 53    Prior to Admission medications  Medication Sig Start Date End Date Taking? Authorizing Provider  abemaciclib  (VERZENIO ) 50 MG tablet Take 1 tablet (50 mg total) by mouth 2 (two) times daily. 10/18/24  Yes Gudena, Vinay, MD  amLODipine  (NORVASC ) 5 MG tablet Take 1 tablet (5 mg total) by mouth daily. 08/06/24 08/06/25 Yes Nicholaus Rolland BRAVO, MD  escitalopram  (LEXAPRO ) 10 MG tablet Take 1 tablet (10 mg total) by mouth  daily. 08/10/24  Yes Dugal, Tabitha, FNP  escitalopram  (LEXAPRO ) 20 MG tablet Take 1 tablet (20 mg total) by mouth daily. 08/10/24  Yes Dugal, Tabitha, FNP  letrozole  (FEMARA ) 2.5 MG tablet Take 1 tablet (2.5 mg total) by mouth daily. 08/13/24  Yes Gudena, Vinay, MD  pantoprazole  (PROTONIX ) 40 MG tablet Take 1 tablet (40 mg total) by mouth daily. 09/26/24  Yes Gudena, Vinay, MD  sucralfate  (CARAFATE ) 1 g tablet  08/06/24  Yes [provider]  UNKNOWN TO PATIENT Fiber Suppliment   Yes [provider]  zolpidem  (AMBIEN ) 5 MG tablet Take 1 tablet (5 mg total) by mouth at bedtime as needed for sleep. 09/10/24  Yes Gudena, Vinay, MD  ciprofloxacin  (CIPRO ) 500 MG tablet Take 1 tablet (500 mg total) by mouth 2 (two) times daily. 10/09/24   Thayil, Irene T, PA-C  promethazine  (PHENERGAN ) 25 MG suppository Place 1 suppository (25 mg total) rectally every 8 (eight) hours as needed for nausea or vomiting. 09/26/24   Odean Potts, MD    Allergies as of 10/03/2024 - Review Complete 10/03/2024  Allergen Reaction Noted   Buspirone  Nausea Only 08/27/2022    Family History  Problem Relation Age of Onset   Alzheimer's disease Mother    Lung cancer Father        smoker   Alcohol abuse Father    Colon cancer Neg Hx    Esophageal cancer Neg Hx  Rectal cancer Neg Hx    Stomach cancer Neg Hx     Social History   Socioeconomic History   Marital status: Married    Spouse name: Not on file   Number of children: 0   Years of education: Not on file   Highest education level: Bachelor's degree (e.g., BA, AB, BS)  Occupational History   Occupation: retired  Tobacco Use   Smoking status: Former   Smokeless tobacco: Never   Tobacco comments:    Social smoker in mid 20's.  Never addicted  Vaping Use   Vaping status: Never Used  Substance and Sexual Activity   Alcohol use: Yes    Alcohol/week: 2.0 standard drinks of alcohol    Types: 2 Glasses of wine per week   Drug use: No    Sexual activity: Not Currently    Birth control/protection: Abstinence  Other Topics Concern   Not on file  Social History Narrative   Not on file   Social Drivers of Health   Tobacco Use: Medium Risk (10/26/2024)   Patient History    Smoking Tobacco Use: Former    Smokeless Tobacco Use: Never    Passive Exposure: Not on file  Financial Resource Strain: Low Risk (08/31/2023)   Overall Financial Resource Strain (CARDIA)    Difficulty of Paying Living Expenses: Not very hard  Food Insecurity: No Food Insecurity (08/31/2023)   Hunger Vital Sign    Worried About Running Out of Food in the Last Year: Never true    Ran Out of Food in the Last Year: Never true  Transportation Needs: No Transportation Needs (08/31/2023)   PRAPARE - Administrator, Civil Service (Medical): No    Lack of Transportation (Non-Medical): No  Physical Activity: Sufficiently Active (08/31/2023)   Exercise Vital Sign    Days of Exercise per Week: 6 days    Minutes of Exercise per Session: 120 min  Stress: Stress Concern Present (08/31/2023)   [employer] of Occupational Health - Occupational Stress Questionnaire    Feeling of Stress : Rather much  Social Connections: Socially Isolated (08/31/2023)   Social Connection and Isolation Panel    Frequency of Communication with Friends and Family: Once a week    Frequency of Social Gatherings with Friends and Family: Once a week    Attends Religious Services: Never    Database Administrator or Organizations: No    Attends Engineer, Structural: Not on file    Marital Status: Married  Catering Manager Violence: Not on file  Depression (PHQ2-9): Medium Risk (03/29/2022)   Depression (PHQ2-9)    PHQ-2 Score: 8  Alcohol Screen: Low Risk (08/31/2023)   Alcohol Screen    Last Alcohol Screening Score (AUDIT): 4  Housing: Low Risk (08/31/2023)   Housing    Last Housing Risk Score: 0  Utilities: Not on file  Health Literacy: Not on file     Review of Systems: See HPI, otherwise negative ROS  Physical Exam: Ht 5' 3 (1.6 m)   Wt 53.1 kg   BMI 20.73 kg/m  CONSTITUTIONAL: Well-appearing in no acute distress.  HEENT: Pupils equal, round, Extraocular movements intact. Conjunctivae clear NECK: Neck supple CARDIOVASCULAR: Regular rate, no LE edema  RESPIRATORY: No labored breathing  ABDOMEN: Abdomen soft, nontender, not distended, no guarding, no rigidity SKIN: No apparent skin rashes or lesions. NEUROLOGIC: Normal speech, no focal findings. Mental status alert and oriented x4. PSYCHIATRIC: Mood and affect normal.   Impression/Plan: Kathy  Bishop is here for an endoscopy to be performed for nausea persistent despite PPI.  Risks, benefits, limitations, and alternatives regarding  endoscopy have been reviewed with the patient.  Questions have been answered.  All parties agreeable.   MELANY Kathy HERO, MD  10/26/2024, 7:24 AM  "

## 2024-10-26 NOTE — Anesthesia Postprocedure Evaluation (Signed)
"   Anesthesia Post Note  Patient: Kathy Bishop  Procedure(s) Performed: EGD (ESOPHAGOGASTRODUODENOSCOPY) (Mouth)  Patient location during evaluation: Endoscopy Anesthesia Type: General Level of consciousness: awake and alert Pain management: pain level controlled Vital Signs Assessment: post-procedure vital signs reviewed and stable Respiratory status: spontaneous breathing, nonlabored ventilation, respiratory function stable and patient connected to nasal cannula oxygen Cardiovascular status: blood pressure returned to baseline and stable Postop Assessment: no apparent nausea or vomiting Anesthetic complications: no   No notable events documented.   Last Vitals:  Vitals:   10/26/24 0828 10/26/24 0831  BP:  138/73  Pulse: 60 64  Resp: 19 16  Temp:    SpO2: 98% 98%    Last Pain:  Vitals:   10/26/24 0821  TempSrc: Oral  PainSc:                  [employer] Tate Zagal      "

## 2024-10-30 ENCOUNTER — Ambulatory Visit (INDEPENDENT_AMBULATORY_CARE_PROVIDER_SITE_OTHER)

## 2024-10-30 VITALS — BP 104/66 | Ht 63.0 in | Wt 117.0 lb

## 2024-10-30 DIAGNOSIS — Z1231 Encounter for screening mammogram for malignant neoplasm of breast: Secondary | ICD-10-CM

## 2024-10-30 DIAGNOSIS — Z0000 Encounter for general adult medical examination without abnormal findings: Secondary | ICD-10-CM | POA: Diagnosis not present

## 2024-10-30 DIAGNOSIS — Z780 Asymptomatic menopausal state: Secondary | ICD-10-CM

## 2024-10-30 NOTE — Progress Notes (Signed)
 "  Please attest and cosign this visit due to patients primary care provider not being in the office at the time the visit was completed.   Chief Complaint  Patient presents with   Medicare Wellness     Subjective:   Kathy Bishop is a 74 y.o. female who presents for a Medicare Annual Wellness Visit.  Visit info / Clinical Intake: Medicare Wellness Visit Type:: Subsequent Annual Wellness Visit Persons participating in visit and providing information:: patient Medicare Wellness Visit Mode:: In-person (required for WTM) Interpreter Needed?: No Pre-visit prep was completed: yes AWV questionnaire completed by patient prior to visit?: no Living arrangements:: lives with spouse/significant other Patient's Overall Health Status Rating: good Typical amount of pain: none Does pain affect daily life?: no Are you currently prescribed opioids?: no  Dietary Habits and Nutritional Risks How many meals a day?: 2 Eats fruit and vegetables daily?: yes Most meals are obtained by: preparing own meals In the last 2 weeks, have you had any of the following?: none Diabetic:: no  Functional Status Activities of Daily Living (to include ambulation/medication): Independent Ambulation: Independent Medication Administration: Independent Home Management (perform basic housework or laundry): Independent Manage your own finances?: yes Primary transportation is: driving Concerns about vision?: no *vision screening is required for WTM* Concerns about hearing?: no  Fall Screening Falls in the past year?: 0 Number of falls in past year: 0 Was there an injury with Fall?: 0 Fall Risk Category Calculator: 0 Patient Fall Risk Level: Low Fall Risk  Fall Risk Patient at Risk for Falls Due to: No Fall Risks Fall risk Follow up: Falls evaluation completed; Education provided  Home and Transportation Safety: All rugs have non-skid backing?: yes All stairs or steps have railings?: yes Grab bars in the  bathtub or shower?: (!) no Have non-skid surface in bathtub or shower?: yes Good home lighting?: yes Regular seat belt use?: yes Hospital stays in the last year:: no  Cognitive Assessment Difficulty concentrating, remembering, or making decisions? : no Will 6CIT or Mini Cog be Completed: yes What year is it?: 0 points What month is it?: 0 points Give patient an address phrase to remember (5 components): [address] [place]  About what time is it?: 0 points Count backwards from 20 to 1: 0 points Say the months of the year in reverse: 0 points Repeat the address phrase from earlier: 0 points 6 CIT Score: 0 points  Advance Directives (For Healthcare) Does Patient Have a Medical Advance Directive?: Yes Does patient want to make changes to medical advance directive?: No - Patient declined Type of Advance Directive: Healthcare Power of [place]; Living will Copy of Healthcare Power of Attorney in Chart?: Yes - validated most recent copy scanned in chart (See row information) Copy of Living Will in Chart?: Yes - validated most recent copy scanned in chart (See row information)  Reviewed/Updated  Reviewed/Updated: Reviewed All (Medical, Surgical, Family, Medications, Allergies, Care Teams, Patient Goals)    Allergies (verified) Buspirone    Current Medications (verified) Outpatient Encounter Medications as of 10/30/2024  Medication Sig   abemaciclib  (VERZENIO ) 50 MG tablet Take 1 tablet (50 mg total) by mouth 2 (two) times daily.   amLODipine  (NORVASC ) 5 MG tablet Take 1 tablet (5 mg total) by mouth daily.   ciprofloxacin  (CIPRO ) 500 MG tablet Take 1 tablet (500 mg total) by mouth 2 (two) times daily.   escitalopram  (LEXAPRO ) 10 MG tablet Take 1 tablet (10 mg total) by mouth daily.   escitalopram  (LEXAPRO )  20 MG tablet Take 1 tablet (20 mg total) by mouth daily.   letrozole  (FEMARA ) 2.5 MG tablet Take 1 tablet (2.5 mg total) by mouth daily.   pantoprazole  (PROTONIX ) 40 MG  tablet Take 1 tablet (40 mg total) by mouth daily.   promethazine  (PHENERGAN ) 25 MG suppository Place 1 suppository (25 mg total) rectally every 8 (eight) hours as needed for nausea or vomiting.   sucralfate  (CARAFATE ) 1 g tablet    UNKNOWN TO PATIENT Fiber Suppliment   zolpidem  (AMBIEN ) 5 MG tablet Take 1 tablet (5 mg total) by mouth at bedtime as needed for sleep.   No facility-administered encounter medications on file as of 10/30/2024.    History: Past Medical History:  Diagnosis Date   Anxiety    Breast cancer (HCC)    Depression    Dermatitis    allergic   Foot pain, right    History of radiation therapy 12/20/16- 01/17/17   Left Breast 40.05 Gy in 15 fractions and a 10 Gy boost in 5 fractions.    Malignant neoplasm of upper-outer quadrant of left breast in female, estrogen receptor positive (HCC) 08/31/2016   Need for prophylactic hormone replacement therapy (postmenopausal)    Neuromuscular disorder (HCC)    Nocturia    Personal history of radiation therapy    Routine general medical examination at a health care facility    Symptomatic menopausal or female climacteric states    Past Surgical History:  Procedure Laterality Date   BREAST LUMPECTOMY Left    BREAST LUMPECTOMY WITH SENTINEL LYMPH NODE BIOPSY Left 11/09/2016   Procedure: LEFT BREAST LUMPECTOMY WITH SENTINEL LYMPH NODE BX;  Surgeon: Vicenta Poli, MD;  Location: [facility];  Service: General;  Laterality: Left;   ESOPHAGOGASTRODUODENOSCOPY N/A 10/26/2024   Procedure: EGD (ESOPHAGOGASTRODUODENOSCOPY);  Surgeon: Melany Clotilda HERO, MD;  Location[facility];  Service: Endoscopy;  Laterality: N/A;   EYE SURGERY  11/12/22   Removal of cataracts on both eyes   TONSILLECTOMY     age 62   Family History  Problem Relation Age of Onset   Alzheimer's disease Mother    Lung cancer Father        smoker   Alcohol abuse Father    Colon cancer Neg Hx    Esophageal cancer Neg Hx    Rectal cancer  Neg Hx    Stomach cancer Neg Hx    Social History   Occupational History   Occupation: retired  Tobacco Use   Smoking status: Former   Smokeless tobacco: Never   Tobacco comments:    Social smoker in mid 20's.  Never addicted  Vaping Use   Vaping status: Never Used  Substance and Sexual Activity   Alcohol use: Yes    Alcohol/week: 2.0 standard drinks of alcohol    Types: 2 Glasses of wine per week   Drug use: No   Sexual activity: Not Currently    Birth control/protection: Abstinence   Tobacco Counseling Counseling given: Not Answered Tobacco comments: Social smoker in mid 20's.  Never addicted  SDOH Screenings   Food Insecurity: No Food Insecurity (10/30/2024)  Housing: Unknown (10/30/2024)  Transportation Needs: No Transportation Needs (10/30/2024)  Utilities: Not At Risk (10/30/2024)  Alcohol Screen: Low Risk (08/31/2023)  Depression (PHQ2-9): Low Risk (10/30/2024)  Financial Resource Strain: Low Risk (08/31/2023)  Physical Activity: Sufficiently Active (10/30/2024)  Social Connections: Moderately Integrated (10/30/2024)  Stress: Stress Concern Present (10/30/2024)  Tobacco Use: Medium Risk (10/30/2024)  Health Literacy:  Adequate Health Literacy (10/30/2024)   See flowsheets for full screening details  Depression Screen PHQ 2 & 9 Depression Scale- Over the past 2 weeks, how often have you been bothered by any of the following problems? Little interest or pleasure in doing things: 0 Feeling down, depressed, or hopeless (PHQ Adolescent also includes...irritable): 1 PHQ-2 Total Score: 1     Goals Addressed             This Visit's Progress    I would like to get on the other side of the cancer       Patient Stated   On track    Manage mood, keep weight down             Objective:    Today's Vitals   10/30/24 1448  BP: 104/66  Weight: 117 lb (53.1 kg)  Height: 5' 3 (1.6 m)   Body mass index is 20.73 kg/m.  Hearing/Vision screen No results  found. Immunizations and Health Maintenance Health Maintenance  Topic Date Due   Pneumococcal Vaccine: 50+ Years (1 of 2 - PCV) Never done   Zoster Vaccines- Shingrix (1 of 2) Never done   DTaP/Tdap/Td (2 - Tdap) 10/11/2013   Bone Density Scan  12/08/2018   Medicare Annual Wellness (AWV)  03/30/2023   COVID-19 Vaccine (4 - 2025-26 season) 06/11/2024   Mammogram  09/15/2024   Influenza Vaccine  01/08/2025 (Originally 05/11/2024)   Colonoscopy  11/28/2030   Hepatitis C Screening  Completed   Meningococcal B Vaccine  Aged Out        Assessment/Plan:  This is a routine wellness examination for Bridney.  Patient Care Team: Corwin Antu, FNP as PCP - General (Family Medicine) Argentina Clap, MD as PCP - Cardiology (Cardiology) Vernetta Berg, MD as Consulting Physician (General Surgery) Izell Domino, MD as Attending Physician (Radiation Oncology) Crawford Morna Pickle, NP as Nurse Practitioner (Hematology and Oncology) Ladora, My [place], [place] as Referring Physician (Optometry)  I have personally reviewed and noted the following in the patients chart:   Medical and social history Use of alcohol, tobacco or illicit drugs  Current medications and supplements including opioid prescriptions. Functional ability and status Nutritional status Physical activity Advanced directives List of other physicians Hospitalizations, surgeries, and ER visits in previous 12 months Vitals Screenings to include cognitive, depression, and falls Referrals and appointments   In addition, I have reviewed and discussed with patient certain preventive protocols, quality metrics, and best practice recommendations. A written personalized care plan for preventive services as well as general preventive health recommendations were provided to patient.   Erminio LITTIE Saris, LPN   8/79/7973   After Visit Summary: (In Person-Declined) Patient declined AVS at this time.  Nurse Notes: No voiced or noted  concerns at this time Vaccines not given: Pcv, Covid, Shingles declined today HM Addressed: Mammogram ordered DEXA ordered Pt is due for annual w/PCP: says will go home and check calendar and call back to schedule     "

## 2024-10-30 NOTE — Patient Instructions (Addendum)
 Kathy Bishop,  Thank you for taking the time for your Medicare Wellness Visit. I appreciate your continued commitment to your health goals. Please review the care plan we discussed, and feel free to reach out if I can assist you further.  Please note that Annual Wellness Visits do not include a physical exam. Some assessments may be limited, especially if the visit was conducted virtually. If needed, we may recommend an in-person follow-up with your provider.  Ongoing Care Seeing your primary care provider every 3 to 6 months helps us  monitor your health and provide consistent, personalized care.   Referrals If a referral was made during today's visit and you haven't received any updates within two weeks, please contact the referred provider directly to check on the status.  You have an order for:  []   2D Mammogram  [x]   3D Mammogram  [x]   Bone Density     Please call for appointment:  [facility] [address] [place], [place] [address] [phone]  [facility] [address] [place], [place] [address] [phone]  [facility] [address] [place], [place] [address] [phone]  [facility] [place] Bone Density [address] [place], [place] [address] [phone]  [facility] [address] [place], [place] [address] [phone]    Make sure to wear two-piece clothing.  No lotions, powders, or deodorants the day of the appointment. Make sure to bring picture ID and insurance card.  Bring list of medications you are currently taking including any supplements.    Recommended Screenings:  Health Maintenance  Topic Date Due   Pneumococcal Vaccine for age over 34 (1 of 2 - PCV) Never done   Zoster (Shingles) Vaccine (1 of 2) Never done   DTaP/Tdap/Td vaccine (2 - Tdap) 10/11/2013   Osteoporosis screening with Bone Density Scan  12/08/2018    Medicare Annual Wellness Visit  03/30/2023   COVID-19 Vaccine (4 - 2025-26 season) 06/11/2024   Breast Cancer Screening  09/15/2024   Flu Shot  01/08/2025*   Colon Cancer Screening  11/28/2030   Hepatitis C Screening  Completed   Meningitis B Vaccine  Aged Out  *Topic was postponed. The date shown is not the original due date.       10/26/2024    7:24 AM  Advanced Directives  Does Patient Have a Medical Advance Directive? Yes  Type of Estate Agent of [place];Living will  Does patient want to make changes to medical advance directive? No - Patient declined  Copy of Healthcare Power of Attorney in Chart? Yes - validated most recent copy scanned in chart (See row information)    Vision: Annual vision screenings are recommended for early detection of glaucoma, cataracts, and diabetic retinopathy. These exams can also reveal signs of chronic conditions such as diabetes and high blood pressure.  Dental: Annual dental screenings help detect early signs of oral cancer, gum disease, and other conditions linked to overall health, including heart disease and diabetes.  Please see the attached documents for additional preventive care recommendations.

## 2024-10-31 ENCOUNTER — Ambulatory Visit: Payer: Self-pay | Admitting: Gastroenterology

## 2024-10-31 LAB — SURGICAL PATHOLOGY

## 2024-11-04 ENCOUNTER — Other Ambulatory Visit: Payer: Self-pay | Admitting: Family

## 2024-11-05 ENCOUNTER — Ambulatory Visit

## 2024-11-13 ENCOUNTER — Ambulatory Visit
Admission: RE | Admit: 2024-11-13 | Discharge: 2024-11-13 | Disposition: A | Source: Ambulatory Visit | Attending: Nurse Practitioner

## 2024-11-15 ENCOUNTER — Inpatient Hospital Stay: Attending: Hematology and Oncology

## 2024-11-15 ENCOUNTER — Inpatient Hospital Stay: Admitting: Hematology and Oncology

## 2024-11-15 VITALS — BP 117/69 | HR 66 | Temp 98.1°F | Resp 18 | Ht 63.0 in | Wt 116.9 lb

## 2024-11-15 DIAGNOSIS — C50412 Malignant neoplasm of upper-outer quadrant of left female breast: Secondary | ICD-10-CM

## 2024-11-15 DIAGNOSIS — Z170 Estrogen receptor positive status [ER+]: Secondary | ICD-10-CM

## 2024-11-15 LAB — CMP (CANCER CENTER ONLY)
ALT: 11 U/L (ref 0–44)
AST: 19 U/L (ref 15–41)
Albumin: 4.3 g/dL (ref 3.5–5.0)
Alkaline Phosphatase: 82 U/L (ref 38–126)
Anion gap: 9 (ref 5–15)
BUN: 15 mg/dL (ref 8–23)
CO2: 27 mmol/L (ref 22–32)
Calcium: 9.4 mg/dL (ref 8.9–10.3)
Chloride: 104 mmol/L (ref 98–111)
Creatinine: 1 mg/dL (ref 0.44–1.00)
GFR, Estimated: 59 mL/min — ABNORMAL LOW
Glucose, Bld: 86 mg/dL (ref 70–99)
Potassium: 4.4 mmol/L (ref 3.5–5.1)
Sodium: 140 mmol/L (ref 135–145)
Total Bilirubin: 0.5 mg/dL (ref 0.0–1.2)
Total Protein: 6.9 g/dL (ref 6.5–8.1)

## 2024-11-15 LAB — CBC WITH DIFFERENTIAL (CANCER CENTER ONLY)
Abs Immature Granulocytes: 0.01 10*3/uL (ref 0.00–0.07)
Basophils Absolute: 0.1 10*3/uL (ref 0.0–0.1)
Basophils Relative: 1 %
Eosinophils Absolute: 0.1 10*3/uL (ref 0.0–0.5)
Eosinophils Relative: 3 %
HCT: 33 % — ABNORMAL LOW (ref 36.0–46.0)
Hemoglobin: 11.2 g/dL — ABNORMAL LOW (ref 12.0–15.0)
Immature Granulocytes: 0 %
Lymphocytes Relative: 33 %
Lymphs Abs: 1.2 10*3/uL (ref 0.7–4.0)
MCH: 33.2 pg (ref 26.0–34.0)
MCHC: 33.9 g/dL (ref 30.0–36.0)
MCV: 97.9 fL (ref 80.0–100.0)
Monocytes Absolute: 0.3 10*3/uL (ref 0.1–1.0)
Monocytes Relative: 7 %
Neutro Abs: 1.9 10*3/uL (ref 1.7–7.7)
Neutrophils Relative %: 56 %
Platelet Count: 188 10*3/uL (ref 150–400)
RBC: 3.37 MIL/uL — ABNORMAL LOW (ref 3.87–5.11)
RDW: 14.9 % (ref 11.5–15.5)
WBC Count: 3.5 10*3/uL — ABNORMAL LOW (ref 4.0–10.5)
nRBC: 0 % (ref 0.0–0.2)

## 2024-11-15 MED ORDER — ONDANSETRON HCL 8 MG PO TABS: 8.0000 mg | ORAL_TABLET | Freq: Three times a day (TID) | ORAL | 3 refills | Status: AC | PRN

## 2024-11-15 NOTE — Assessment & Plan Note (Signed)
 10/21/2015:Left breast, needle core biopsy, 2 o'clock, 8cmfn: IDC, DCIS with necrosis, grade 2, ER+(95%), PR+(100%), Ki-67 15%, HER-2 negative (ratio 0.78). Left breast, needle core biopsy, 2 o'clock, 5cmfn: IDC, grade 2, ER+(95%), PR+(100%), Ki-67 10%, HER-2 negative (ratio 1.06).     11/09/2016:Left breast lumpectomy Jan): IDC with calcifications, Grade II/III, two foci, 3cm and 0.6 cm, DCIS with calcifications, +lymphovascular invasion, 1 SLN negative, T2, N0 Oncotype DX score 25: 17% risk Adjuvant radiation completed 01/17/2017 Adjuvant anastrozole  completed December 2022 ---------------------------------------------------------------------------------------------------------------------- CT abdomen pelvis 08/04/2024: Multiple liver metastases (2.4 cm, 1.1 cm, 2.3 cm), multiple bone metastases: L3 1 cm, endometrial thickening   Recommendation: Ultrasound-guided liver biopsy 08/16/2024: Metastatic carcinoma compatible with breast, ER 99%, PR 2%, Ki-67 5%, HER2 2+ by IHC, FISH negative ratio 1.39 PET CT scan 08/20/2024: Hypermetabolic liver lesions, left axilla soft tissue density hypermetabolic activity concerning for active malignancy, focus of activity left upper sternal body Recommendation: Letrozole  with Verzinio.  Start 08/13/2024 Monitor tumor markers CA 15-3 and CEA 2729   Abemaciclib  toxicities: Nausea and vomiting: In spite of Phenergan  suppositories and also Protonix .  Recommended reducing the dosage of Verzinio to 50 mg p.o. twice daily.  I sent a new prescription to [facility]. Decreased blood counts: Monitoring closely Denies any significant diarrhea   Return to clinic in 1 month for labs and follow-up after scans

## 2024-11-15 NOTE — Progress Notes (Signed)
 "  Patient Care Team: Corwin Antu, FNP as PCP - General (Family Medicine) Argentina Clap, MD as PCP - Cardiology (Cardiology) Vernetta Berg, MD as Consulting Physician (General Surgery) Izell Domino, MD as Attending Physician (Radiation Oncology) Crawford Morna Pickle, NP as Nurse Practitioner (Hematology and Oncology) Ladora, My [place], [place] as Referring Physician (Optometry)  DIAGNOSIS:  Encounter Diagnosis  Name Primary?   Malignant neoplasm of upper-outer quadrant of left breast in female, estrogen receptor positive (HCC) Yes    SUMMARY OF ONCOLOGIC HISTORY: Oncology History Overview Note  3 1/   Malignant neoplasm of upper-outer quadrant of left breast in female, estrogen receptor positive (HCC)  08/20/2016 Initial Biopsy   Left breast, needle core biopsy, 2 o'clock, 8cmfn: IDC, DCIS with necrosis, grade 2, ER+(95%), PR+(100%), Ki-67 15%, HER-2 negative (ratio 0.78). Left breast, needle core biopsy, 2 o'clock, 5cmfn: IDC, grade 2, ER+(95%), PR+(100%), Ki-67 10%, HER-2 negative (ratio 1.06).     09/10/2016 - 09/2021 Anti-estrogen oral therapy   Anastrozole  daily bone density 12/08/2016 showed a T score of -0.2 (normal).   11/09/2016 Surgery   Left breast lumpectomy Jan): IDC with calcifications, Grade II/III, two foci, 3cm and 0.6 cm, DCIS with calcifications, +lymphovascular invasion, 1 SLN negative, T2, N0   11/09/2016 Oncotype testing   25/17% intermediate risk   12/20/2016 - 01/17/2017 Radiation Therapy   Left Breast treated to 40.05 Gy in 15 fx of 2.67 Gy/fx  and a 10 Gy boost in 5 fx of 2 Gy/fx      CHIEF COMPLIANT: Follow-up on Verzinio with letrozole   HISTORY OF PRESENT ILLNESS:  History of Present Illness Kathy Bishop is a 74 year old female with metastatic ER-positive, HER2-negative breast cancer who presents for oncology follow-up to assess treatment tolerance and disease status.  She is three months into oral letrozole  and abemaciclib , with  abemaciclib  reduced to 50 mg twice daily since December 25 for gastrointestinal toxicity and cytopenias. Over the past six weeks she has had four episodes of diarrhea, now less frequent since the dose reduction, but still intermittent. She has persistent nausea controlled with Phenergan  suppositories and ongoing xerostomia and dysgeusia. She denies vomiting, recent falls, or vasomotor symptoms.  Recent labs show hemoglobin increased from 10.5 to 11.2 g/dL, platelets improved from the 100s to 188,000/L, and neutrophils now within normal range. Tumor markers and additional labs are pending.  She notes fatigue, which she attributes to winter and reduced physical activity, and she has not been able to maintain her usual walking routine. She has not noticed any new or concerning symptoms.      ALLERGIES:  is allergic to buspirone .  MEDICATIONS:  Current Outpatient Medications  Medication Sig Dispense Refill   ondansetron  (ZOFRAN ) 8 MG tablet Take 1 tablet (8 mg total) by mouth every 8 (eight) hours as needed for nausea or vomiting. 30 tablet 3   abemaciclib  (VERZENIO ) 50 MG tablet Take 1 tablet (50 mg total) by mouth 2 (two) times daily. 60 tablet 3   amLODipine  (NORVASC ) 5 MG tablet Take 1 tablet (5 mg total) by mouth daily. 90 tablet 1   escitalopram  (LEXAPRO ) 10 MG tablet TAKE 1 TABLET BY MOUTH EVERY DAY 90 tablet 1   escitalopram  (LEXAPRO ) 20 MG tablet Take 1 tablet (20 mg total) by mouth daily. 90 tablet 1   letrozole  (FEMARA ) 2.5 MG tablet Take 1 tablet (2.5 mg total) by mouth daily. 90 tablet 3   pantoprazole  (PROTONIX ) 40 MG tablet Take 1 tablet (40 mg total) by mouth daily. 30  tablet 3   sucralfate  (CARAFATE ) 1 g tablet      UNKNOWN TO PATIENT Fiber Suppliment     zolpidem  (AMBIEN ) 5 MG tablet Take 1 tablet (5 mg total) by mouth at bedtime as needed for sleep. 30 tablet 3   No current facility-administered medications for this visit.    PHYSICAL EXAMINATION: ECOG PERFORMANCE STATUS:  1 - Symptomatic but completely ambulatory  Vitals:   11/15/24 1106  BP: 117/69  Pulse: 66  Resp: 18  Temp: 98.1 F (36.7 C)  SpO2: 100%   Filed Weights   11/15/24 1106  Weight: 116 lb 14.4 oz (53 kg)   LABORATORY DATA:  I have reviewed the data as listed    Latest Ref Rng & Units 11/15/2024   10:45 AM 10/08/2024    1:24 PM 09/26/2024   10:24 AM  CMP  Glucose 70 - 99 mg/dL 86  862  92   BUN 8 - 23 mg/dL 15  17  15    Creatinine 0.44 - 1.00 mg/dL 8.99  8.92  8.97   Sodium 135 - 145 mmol/L 140  140  141   Potassium 3.5 - 5.1 mmol/L 4.4  4.0  3.9   Chloride 98 - 111 mmol/L 104  107  107   CO2 22 - 32 mmol/L 27  24  26    Calcium 8.9 - 10.3 mg/dL 9.4  8.9  9.1   Total Protein 6.5 - 8.1 g/dL 6.9  5.9  6.3   Total Bilirubin 0.0 - 1.2 mg/dL 0.5  0.4  0.5   Alkaline Phos 38 - 126 U/L 82  66  67   AST 15 - 41 U/L 19  15  21    ALT 0 - 44 U/L 11  9  16      Lab Results  Component Value Date   WBC 3.5 (L) 11/15/2024   HGB 11.2 (L) 11/15/2024   HCT 33.0 (L) 11/15/2024   MCV 97.9 11/15/2024   PLT 188 11/15/2024   NEUTROABS 1.9 11/15/2024    ASSESSMENT & PLAN:  Malignant neoplasm of upper-outer quadrant of left breast in female, estrogen receptor positive (HCC) 10/21/2015:Left breast, needle core biopsy, 2 o'clock, 8cmfn: IDC, DCIS with necrosis, grade 2, ER+(95%), PR+(100%), Ki-67 15%, HER-2 negative (ratio 0.78). Left breast, needle core biopsy, 2 o'clock, 5cmfn: IDC, grade 2, ER+(95%), PR+(100%), Ki-67 10%, HER-2 negative (ratio 1.06).     11/09/2016:Left breast lumpectomy Jan): IDC with calcifications, Grade II/III, two foci, 3cm and 0.6 cm, DCIS with calcifications, +lymphovascular invasion, 1 SLN negative, T2, N0 Oncotype DX score 25: 17% risk Adjuvant radiation completed 01/17/2017 Adjuvant anastrozole  completed December 2022 ---------------------------------------------------------------------------------------------------------------------- CT abdomen pelvis  08/04/2024: Multiple liver metastases (2.4 cm, 1.1 cm, 2.3 cm), multiple bone metastases: L3 1 cm, endometrial thickening   Recommendation: Ultrasound-guided liver biopsy 08/16/2024: Metastatic carcinoma compatible with breast, ER 99%, PR 2%, Ki-67 5%, HER2 2+ by IHC, FISH negative ratio 1.39 PET CT scan 08/20/2024: Hypermetabolic liver lesions, left axilla soft tissue density hypermetabolic activity concerning for active malignancy, focus of activity left upper sternal body Recommendation: Letrozole  with Verzinio.  Start 08/13/2024 (Verzinio dose reduced to 50 p.o. twice daily December 2025) Monitor tumor markers CA 15-3 and CEA 2729   Abemaciclib  toxicities: Nausea and vomiting: In spite of Phenergan  suppositories and also Protonix .  Recommended reducing the dosage of Verzinio to 50 mg p.o. twice daily.  I sent a [facility] prescription to [facility]. Decreased blood counts: Monitoring closely Denies any significant diarrhea  Return to clinic in 1 month for labs and follow-up after scans ------------------------------------- Assessment and Plan Assessment & Plan Metastatic ER-positive, HER2-negative breast cancer Clinically stable with improved cytopenias and manageable adverse effects. Dose reduction of abemaciclib  not expected to compromise efficacy. - Ordered monthly laboratory monitoring: CBC, tumor markers (CA 15-3, CEA 2729), renal and hepatic function. - Ordered CT scan in approximately three weeks to assess disease status and response. - Scheduled follow-up in one month after CT and laboratory evaluation. - Provided anticipatory guidance that minor changes in tumor markers will not alter management without imaging evidence of progression.  Nausea and vomiting due to cancer therapy Persistent nausea and dysgeusia attributed to abemaciclib . No new or worsening symptoms. - Prescribed oral ondansetron  for nausea, to be filled at her preferred pharmacy.  Diarrhea due to cancer  therapy Diarrhea improved and now manageable since abemaciclib  dose reduction. Diarrhea is a known toxicity of abemaciclib  and is being monitored.      Orders Placed This Encounter  Procedures   CT CHEST ABDOMEN PELVIS W CONTRAST    Standing Status:   Future    Expected Date:   12/06/2024    Expiration Date:   11/15/2025    If indicated for the ordered procedure, I authorize the administration of contrast media per Radiology protocol:   Yes    Does the patient have a contrast media/X-ray dye allergy?:   No    Preferred imaging location?:   [facility]    Release to patient:   Immediate    If indicated for the ordered procedure, I authorize the administration of oral contrast media per Radiology protocol:   No    Reason for no oral contrast::   breast   The patient has a good understanding of the overall plan. she agrees with it. she will call with any problems that may develop before the next visit here.  I personally spent a total of 30 minutes in the care of the patient today including preparing to see the patient, getting/reviewing separately obtained history, performing a medically appropriate exam/evaluation, counseling and educating, placing orders, referring and communicating with other health care professionals, documenting clinical information in the EHR, independently interpreting results, communicating results, and coordinating care.   Dr.Lam Bjorklund 11/15/24    "

## 2024-11-16 LAB — CANCER ANTIGEN 15-3: CA 15-3: 57.8 U/mL — ABNORMAL HIGH (ref 0.0–25.0)

## 2024-11-16 LAB — CANCER ANTIGEN 27.29: CA 27.29: 60.2 U/mL — ABNORMAL HIGH (ref 0.0–38.6)

## 2024-11-27 ENCOUNTER — Other Ambulatory Visit

## 2024-12-04 ENCOUNTER — Ambulatory Visit: Admitting: Family Medicine

## 2024-12-06 ENCOUNTER — Inpatient Hospital Stay

## 2024-12-06 ENCOUNTER — Ambulatory Visit (HOSPITAL_COMMUNITY)

## 2024-12-17 ENCOUNTER — Inpatient Hospital Stay: Attending: Hematology and Oncology | Admitting: Hematology and Oncology
